# Patient Record
Sex: Male | Born: 1950
Health system: Southern US, Community
[De-identification: ages and names within clinical notes are randomized; demographics above are authoritative.]

## PROBLEM LIST (undated history)

## (undated) DIAGNOSIS — I2510 Atherosclerotic heart disease of native coronary artery without angina pectoris: Secondary | ICD-10-CM

## (undated) DIAGNOSIS — Z9989 Dependence on other enabling machines and devices: Secondary | ICD-10-CM

## (undated) DIAGNOSIS — G4733 Obstructive sleep apnea (adult) (pediatric): Secondary | ICD-10-CM

## (undated) DIAGNOSIS — R51 Headache: Secondary | ICD-10-CM

## (undated) DIAGNOSIS — K219 Gastro-esophageal reflux disease without esophagitis: Secondary | ICD-10-CM

## (undated) HISTORY — DX: Obstructive sleep apnea (adult) (pediatric): G47.33

## (undated) HISTORY — DX: Dependence on other enabling machines and devices: Z99.89

---
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
## 1971-12-14 HISTORY — PX: KNEE ARTHROSCOPY W/ ACL RECONSTRUCTION: SHX1858

## 1998-03-13 ENCOUNTER — Encounter: Admission: RE | Admit: 1998-03-13 | Discharge: 1998-06-11 | Payer: Self-pay | Admitting: Internal Medicine

## 1998-03-17 ENCOUNTER — Ambulatory Visit (HOSPITAL_COMMUNITY): Admission: RE | Admit: 1998-03-17 | Discharge: 1998-03-17 | Payer: Self-pay | Admitting: Internal Medicine

## 1998-12-13 HISTORY — PX: CORONARY ANGIOPLASTY WITH STENT PLACEMENT: SHX49

## 1999-02-20 ENCOUNTER — Ambulatory Visit (HOSPITAL_COMMUNITY): Admission: RE | Admit: 1999-02-20 | Discharge: 1999-02-21 | Payer: Self-pay | Admitting: Cardiovascular Disease

## 2000-09-05 ENCOUNTER — Ambulatory Visit (HOSPITAL_COMMUNITY): Admission: RE | Admit: 2000-09-05 | Discharge: 2000-09-05 | Payer: Self-pay | Admitting: Interventional Cardiology

## 2000-09-12 ENCOUNTER — Ambulatory Visit (HOSPITAL_BASED_OUTPATIENT_CLINIC_OR_DEPARTMENT_OTHER): Admission: RE | Admit: 2000-09-12 | Discharge: 2000-09-12 | Payer: Self-pay | Admitting: Internal Medicine

## 2001-05-16 ENCOUNTER — Emergency Department (HOSPITAL_COMMUNITY): Admission: EM | Admit: 2001-05-16 | Discharge: 2001-05-16 | Payer: Self-pay | Admitting: Emergency Medicine

## 2005-06-10 ENCOUNTER — Encounter: Admission: RE | Admit: 2005-06-10 | Discharge: 2005-06-10 | Payer: Self-pay | Admitting: Internal Medicine

## 2005-11-12 ENCOUNTER — Emergency Department (HOSPITAL_COMMUNITY): Admission: EM | Admit: 2005-11-12 | Discharge: 2005-11-12 | Payer: Self-pay | Admitting: Emergency Medicine

## 2005-12-26 ENCOUNTER — Encounter: Admission: RE | Admit: 2005-12-26 | Discharge: 2005-12-26 | Payer: Self-pay | Admitting: Sports Medicine

## 2006-03-06 ENCOUNTER — Emergency Department (HOSPITAL_COMMUNITY): Admission: EM | Admit: 2006-03-06 | Discharge: 2006-03-06 | Payer: Self-pay | Admitting: Emergency Medicine

## 2006-05-23 ENCOUNTER — Encounter: Admission: RE | Admit: 2006-05-23 | Discharge: 2006-05-23 | Payer: Self-pay | Admitting: Internal Medicine

## 2008-10-23 ENCOUNTER — Ambulatory Visit: Payer: Self-pay | Admitting: Thoracic Surgery

## 2008-11-12 ENCOUNTER — Encounter: Admission: RE | Admit: 2008-11-12 | Discharge: 2008-11-12 | Payer: Self-pay | Admitting: Thoracic Surgery

## 2008-11-13 ENCOUNTER — Ambulatory Visit: Payer: Self-pay | Admitting: Thoracic Surgery

## 2008-12-18 ENCOUNTER — Ambulatory Visit (HOSPITAL_COMMUNITY): Admission: RE | Admit: 2008-12-18 | Discharge: 2008-12-18 | Payer: Self-pay | Admitting: Thoracic Surgery

## 2009-02-07 ENCOUNTER — Inpatient Hospital Stay (HOSPITAL_COMMUNITY): Admission: RE | Admit: 2009-02-07 | Discharge: 2009-02-11 | Payer: Self-pay | Admitting: Thoracic Surgery

## 2009-02-07 ENCOUNTER — Ambulatory Visit: Payer: Self-pay | Admitting: Thoracic Surgery

## 2009-02-19 ENCOUNTER — Encounter: Admission: RE | Admit: 2009-02-19 | Discharge: 2009-02-19 | Payer: Self-pay | Admitting: Thoracic Surgery

## 2009-02-19 ENCOUNTER — Ambulatory Visit: Payer: Self-pay | Admitting: Thoracic Surgery

## 2009-03-12 ENCOUNTER — Ambulatory Visit: Payer: Self-pay | Admitting: Thoracic Surgery

## 2009-03-12 ENCOUNTER — Encounter: Admission: RE | Admit: 2009-03-12 | Discharge: 2009-03-12 | Payer: Self-pay | Admitting: Thoracic Surgery

## 2009-04-23 ENCOUNTER — Ambulatory Visit: Payer: Self-pay | Admitting: Thoracic Surgery

## 2009-04-23 ENCOUNTER — Encounter: Admission: RE | Admit: 2009-04-23 | Discharge: 2009-04-23 | Payer: Self-pay | Admitting: Thoracic Surgery

## 2009-12-13 HISTORY — PX: OTHER SURGICAL HISTORY: SHX169

## 2009-12-19 ENCOUNTER — Encounter: Admission: RE | Admit: 2009-12-19 | Discharge: 2009-12-19 | Payer: Self-pay | Admitting: Internal Medicine

## 2011-02-08 ENCOUNTER — Other Ambulatory Visit: Payer: Self-pay | Admitting: Sports Medicine

## 2011-02-08 DIAGNOSIS — M545 Low back pain: Secondary | ICD-10-CM

## 2011-02-08 DIAGNOSIS — M79606 Pain in leg, unspecified: Secondary | ICD-10-CM

## 2011-02-13 ENCOUNTER — Ambulatory Visit
Admission: RE | Admit: 2011-02-13 | Discharge: 2011-02-13 | Disposition: A | Discharge status: Home / Self Care | Payer: 59 | Source: Ambulatory Visit | Attending: Sports Medicine | Admitting: Sports Medicine

## 2011-02-13 DIAGNOSIS — M545 Low back pain: Secondary | ICD-10-CM

## 2011-02-13 DIAGNOSIS — M79606 Pain in leg, unspecified: Secondary | ICD-10-CM

## 2011-02-16 ENCOUNTER — Emergency Department (HOSPITAL_COMMUNITY): Payer: 59

## 2011-02-16 ENCOUNTER — Observation Stay (HOSPITAL_COMMUNITY)
Admission: EM | Admit: 2011-02-16 | Discharge: 2011-02-17 | Disposition: A | Discharge status: Home / Self Care | Payer: 59 | Attending: Family Medicine | Admitting: Family Medicine

## 2011-02-16 DIAGNOSIS — Z9861 Coronary angioplasty status: Secondary | ICD-10-CM | POA: Insufficient documentation

## 2011-02-16 DIAGNOSIS — R0602 Shortness of breath: Secondary | ICD-10-CM | POA: Insufficient documentation

## 2011-02-16 DIAGNOSIS — I2510 Atherosclerotic heart disease of native coronary artery without angina pectoris: Secondary | ICD-10-CM | POA: Insufficient documentation

## 2011-02-16 DIAGNOSIS — R110 Nausea: Secondary | ICD-10-CM | POA: Insufficient documentation

## 2011-02-16 DIAGNOSIS — Z7982 Long term (current) use of aspirin: Secondary | ICD-10-CM | POA: Insufficient documentation

## 2011-02-16 DIAGNOSIS — R079 Chest pain, unspecified: Principal | ICD-10-CM | POA: Insufficient documentation

## 2011-02-16 LAB — COMPREHENSIVE METABOLIC PANEL WITH GFR
Alkaline Phosphatase: 54 U/L (ref 39–117)
BUN: 14 mg/dL (ref 6–23)
Creatinine, Ser: 1.32 mg/dL (ref 0.4–1.5)
Glucose, Bld: 88 mg/dL (ref 70–99)
Potassium: 3.8 meq/L (ref 3.5–5.1)
Total Protein: 7 g/dL (ref 6.0–8.3)

## 2011-02-16 LAB — CBC
HCT: 38.7 % — ABNORMAL LOW (ref 39.0–52.0)
Hemoglobin: 12.9 g/dL — ABNORMAL LOW (ref 13.0–17.0)
MCH: 30.3 pg (ref 26.0–34.0)
MCHC: 33.3 g/dL (ref 30.0–36.0)
MCV: 90.8 fL (ref 78.0–100.0)
Platelets: 183 10*3/uL (ref 150–400)
RBC: 4.26 MIL/uL (ref 4.22–5.81)
RDW: 13.1 % (ref 11.5–15.5)
WBC: 6 10*3/uL (ref 4.0–10.5)

## 2011-02-16 LAB — COMPREHENSIVE METABOLIC PANEL
ALT: 23 U/L (ref 0–53)
AST: 31 U/L (ref 0–37)
Albumin: 4 g/dL (ref 3.5–5.2)
CO2: 27 mEq/L (ref 19–32)
Calcium: 9.6 mg/dL (ref 8.4–10.5)
Chloride: 106 mEq/L (ref 96–112)
GFR calc Af Amer: >60 mL/min (ref >60)
GFR calc non Af Amer: 55 mL/min — ABNORMAL LOW (ref >60)
Sodium: 138 mEq/L (ref 135–145)
Total Bilirubin: 0.6 mg/dL (ref 0.3–1.2)

## 2011-02-16 LAB — DIFFERENTIAL
Basophils Absolute: 0 10*3/uL (ref 0.0–0.1)
Basophils Relative: 0 % (ref 0–1)
Eosinophils Absolute: 0.1 10*3/uL (ref 0.0–0.7)
Eosinophils Relative: 2 % (ref 0–5)
Lymphocytes Relative: 35 % (ref 12–46)
Lymphs Abs: 2.1 K/uL (ref 0.7–4.0)
Monocytes Absolute: 0.6 K/uL (ref 0.1–1.0)
Monocytes Relative: 11 % (ref 3–12)
Neutro Abs: 3.2 10*3/uL (ref 1.7–7.7)
Neutrophils Relative %: 53 % (ref 43–77)

## 2011-02-16 LAB — POCT CARDIAC MARKERS
CKMB, poc: 1.7 ng/mL (ref 1.0–8.0)
Myoglobin, poc: 111 ng/mL (ref 12–200)
Troponin i, poc: <0.05 ng/mL (ref 0.00–0.09)

## 2011-02-16 MED ORDER — IOHEXOL 300 MG/ML  SOLN
50.0000 mL | Freq: Once | INTRAMUSCULAR | Status: AC | PRN
  Administered 2011-02-16: 50 mL via INTRAVENOUS

## 2011-02-17 LAB — TSH: TSH: 2.286 u[IU]/mL (ref 0.350–4.500)

## 2011-02-17 LAB — LIPID PANEL
Cholesterol: 155 mg/dL (ref 0–200)
HDL: 48 mg/dL (ref >39)
LDL Cholesterol: 93 mg/dL (ref 0–99)
Total CHOL/HDL Ratio: 3.2 ratio
Triglycerides: 70 mg/dL (ref <150)
VLDL: 14 mg/dL (ref 0–40)

## 2011-02-17 LAB — COMPREHENSIVE METABOLIC PANEL
ALT: 21 U/L (ref 0–53)
AST: 26 U/L (ref 0–37)
Alkaline Phosphatase: 50 U/L (ref 39–117)
CO2: 27 mEq/L (ref 19–32)
Calcium: 8.6 mg/dL (ref 8.4–10.5)
Chloride: 100 mEq/L (ref 96–112)
GFR calc Af Amer: >60 mL/min (ref >60)
GFR calc non Af Amer: 59 mL/min — ABNORMAL LOW (ref >60)
Glucose, Bld: 94 mg/dL (ref 70–99)
Sodium: 136 mEq/L (ref 135–145)
Total Bilirubin: 0.7 mg/dL (ref 0.3–1.2)

## 2011-02-17 LAB — TROPONIN I: Troponin I: <0.01 ng/mL (ref 0.00–0.06)

## 2011-02-17 LAB — CK TOTAL AND CKMB (NOT AT ARMC)
CK, MB: 2.6 ng/mL (ref 0.3–4.0)
Relative Index: 0.7 (ref 0.0–2.5)
Total CK: 383 U/L — ABNORMAL HIGH (ref 7–232)
Total CK: 341 U/L — ABNORMAL HIGH (ref 7–232)

## 2011-02-17 LAB — HEPARIN LEVEL (UNFRACTIONATED): Heparin Unfractionated: 0.83 [IU]/mL — ABNORMAL HIGH (ref 0.30–0.70)

## 2011-02-19 NOTE — Consult Note (Signed)
NAMEJIMMY, PLESSINGER NO.:  [id number]  MEDICAL RECORD NO.:  [id number]           PATIENT TYPE:  I  LOCATION:  3711                         FACILITY:  [facility]  PHYSICIAN:  Corky Crafts, MDDATE OF BIRTH:  June 16, 1951  DATE OF CONSULTATION:  02/17/2011 DATE OF DISCHARGE:  02/17/2011                                CONSULTATION   PRIMARY CARE PHYSICIAN:  Eric L. August Saucer, MD  PRIMARY CARDIOLOGIST:  Lyn Records, MD  REASON FOR CONSULTATION:  Chest pain.  HISTORY OF PRESENT ILLNESS:  The patient is a 60 year old male with known coronary artery disease.  He had a bare metal stent to his circumflex over 10 years ago.  He had a cardiac cath in 2001 showing a widely patent stent and no other significant coronary artery disease. He has had intermittent chest pain over the past few months, the first type of pain he had was in the left side of his chest when he would move his left arm and particularly lift his left arm over his head.  This resolved spontaneously.  Over the past few days, he has had a pressure- like feeling in his chest.  It lasted from Sunday through Tuesday continuously.  Once he came to the hospital, he received aspirin, morphine, and nitroglycerin and said he had relief.  He is not sure which medicine helped relieve the pain.  Since that time, he has not had any further chest discomfort and he feels quite well.  He has been up in his room without any chest pain.  He knows that before coming to the hospital, he was walking up stairs in his house without any difficulties and not having any discomfort in his chest.  He reported some shortness of breath also at times systolic.  He could take a deep breath.  He apparently had a CT scan in the emergency room showing no evidence of pulmonary embolism.  His shortness of breath has resolved as well. Prior to his stent, he does not recall having any type of chest pain. He just had a routine stress test that  showed an abnormality and eventually led to a stent.  PAST MEDICAL HISTORY:  Coronary artery disease.  PAST SURGICAL HISTORY:  Diaphragm surgery.  ALLERGIES:  No known drug allergies.  He did say he had problem tolerating PLAVIX.  MEDICATIONS:  Alavert p.r.n., he is not taking aspirin on a regular basis, although he knows he should.  SOCIAL HISTORY:  No tobacco.  Occasional alcohol.  No caffeine.  FAMILY HISTORY:  No early coronary artery disease.  REVIEW OF SYSTEMS:  Significant for constipation, chest discomfort, shortness of breath as noted above.  No significant bleeding issues.  No syncope.  No nausea, vomiting, or focal weakness.  All other systems negative.  PHYSICAL EXAMINATION:  VITAL SIGNS:  Blood pressure 99/61, pulse 60. GENERAL:  He is awake, alert, in no apparent distress. HEAD:  Normocephalic, atraumatic. EYES:  Extraocular movements intact. NECK:  No JVD. CARDIOVASCULAR:  Regular rate and rhythm.  S1 and S2. LUNGS:  Clear to auscultation bilaterally. ABDOMEN:  Mildly obese,  soft, nontender. EXTREMITIES:  No edema.  2+ dorsalis pedis pulses bilaterally.  2+ right radial pulse. NEURO:  No focal motor or sensory deficits. SKIN:  No rash. BACK:  No kyphosis. PSYCH:  Normal mood and affect.  LABORATORY DATA:  Creatinine 1.3, normal hemoglobin, troponin negative x3.  ECG shows normal sinus rhythm, RSR prime pattern, some inferior T- wave flattening.  Chest CT showed no PE and scattered coronary calcifications.  Chest x-ray showed changes consistent with chronic bronchitis and chronic elevation of the left hemidiaphragm.  ASSESSMENT:  A 60 year old with known coronary artery disease and chest discomfort with several atypical features.  PLAN: 1. Cardiac:  He is ruled out for MI despite over 48 hours of     continuous chest pain.  Currently he pain free.  We discussed     workup with either cath or stress test, he would like to avoid a     cath if possible.   He would agree to it only if he had some     abnormality in his enzymes or an abnormality in his stress test.     Since his enzymes are normal, we will plan for an outpatient stress     test. 2. Coronary artery disease.  He needs to be on aspirin 81 mg daily.     He also should be on a statin long-term. 3. We will discontinued heparin, ambulate in the hallways, if he     remains pain free then we plan allow him to go home today.     Outpatient stress test has been set up at [facility] for this     Friday at 10 a.m.     Corky Crafts, MD     JSV/MEDQ  D:  02/17/2011  T:  02/17/2011  Job:  [id number]  Electronically Signed by Lance Muss MD on 02/18/2011 10:10:09 AM

## 2011-02-19 NOTE — Discharge Summary (Signed)
NAMEVAUGHAN, Kurt Williams NO.:  [id number]  MEDICAL RECORD NO.:  [id number]           PATIENT TYPE:  I  LOCATION:  3711                         FACILITY:  [facility]  PHYSICIAN:  Brendia Sacks, MD    DATE OF BIRTH:  11-11-1951  DATE OF ADMISSION:  02/16/2011 DATE OF DISCHARGE:  02/17/2011                              DISCHARGE SUMMARY   CONDITION ON DISCHARGE:  Improved.  PRIMARY CARE PHYSICIAN:  Eric L. August Saucer, MD  PRIMARY CARDIOLOGIST:  Lyn Records, MD  DISCHARGE DIAGNOSES: 1. Chest pain, ruled out for acute myocardial infarction. 2. History of coronary artery disease with stent placement in the     past.  HISTORY OF PRESENT ILLNESS:  This is a 60 year old male who presented to the emergency room chest pain.  HOSPITAL COURSE: 1. Chest pain:  The patient was admitted to the medical floor.  He was     rule out with serial cardiac enzymes.  He has had no chest pain     since his admission to the ER.  His workup has been otherwise     negative.  He has been seen in consultation with Cardiology.     Cardiac catheterization versus stress test was discussed and the     patient wished to proceed with stress testing.  So it was felt from     Dr. Hoyle Barr standpoint that the patient can be discharged home     today and he could have a stress test as an outpatient on February 19, 2011. 2. History of coronary artery disease:  The patient has not been on     aspirin at home, but I have asked him to resume this medication.  I     have also given him a new prescription for nitroglycerin.  Further     cardiac medications as directed by his primary cardiologist.  CONSULTATIONS:  Cardiology, Dr. Eldridge Dace.  His recommendations as above.  PROCEDURES:  None.  IMAGING: 1. Chest x-ray on February 16, 2011:  Chronic bronchitic changes and     chronic elevation of the left hemidiaphragm.  No acute process. 2. CT angiogram of the chest on February 16, 2011:  No evidence of  pulmonary embolus.  Areas of scarring in the apices and left base.     No acute findings.  PERTINENT LABORATORY STUDIES: 1. CBC essentially unremarkable. 2. D-dimer mildly elevated at 0.66. 3. Basic metabolic panel unremarkable. 4. Hepatic function panel unremarkable. 5. Cardiac enzymes negative.  Mild elevation of total CK.  Lipid panel     unremarkable with an LDL of 93 and triglycerides of 70.  EXAM ON DISCHARGE:  The patient feels well.  No problems.  Otherwise as documented in progress notes.  DISCHARGE INSTRUCTIONS:  The patient will be discharged home today.  DIET:  Heart-healthy diet.  ACTIVITY:  Increase slowly.  No strenuous activity for now until cleared by Cardiology.  FOLLOWUP:  With [facility] on February 19, 2011, at 10 a.m. as per Dr. Hoyle Barr and with Dr. Willey Blade in 2 weeks.  DISCHARGE MEDICATIONS: 1.  Aspirin 81 mg p.o. daily. 2. Nitroglycerin sublingual 0.4 mg p.r.n. chest pain, may repeat q.5     minutes x2, if no relief call 911.  TIME COORDINATING DISCHARGE:  25 minutes.     Brendia Sacks, MD     DG/MEDQ  D:  02/17/2011  T:  02/17/2011  Job:  [id number]  cc:   Minerva Areola L. August Saucer, M.D. Lyn Records, M.D.  Electronically Signed by Brendia Sacks  on 02/18/2011 08:19:02 AM
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
## 2011-03-25 LAB — BASIC METABOLIC PANEL
CO2: 26 mEq/L (ref 19–32)
CO2: 28 mEq/L (ref 19–32)
Chloride: 100 mEq/L (ref 96–112)
Chloride: 99 mEq/L (ref 96–112)
Creatinine, Ser: 1.3 mg/dL (ref 0.4–1.5)
Creatinine, Ser: 1.38 mg/dL (ref 0.4–1.5)
GFR calc Af Amer: >60 mL/min (ref >60)
GFR calc Af Amer: >60 mL/min (ref >60)
Glucose, Bld: 107 mg/dL — ABNORMAL HIGH (ref 70–99)
Potassium: 4.5 mEq/L (ref 3.5–5.1)
Sodium: 132 mEq/L — ABNORMAL LOW (ref 135–145)

## 2011-03-25 LAB — CBC
HCT: 36.8 % — ABNORMAL LOW (ref 39.0–52.0)
Hemoglobin: 12.5 g/dL — ABNORMAL LOW (ref 13.0–17.0)
MCHC: 34 g/dL (ref 30.0–36.0)
MCV: 94.2 fL (ref 78.0–100.0)
RBC: 3.91 MIL/uL — ABNORMAL LOW (ref 4.22–5.81)
WBC: 9.7 10*3/uL (ref 4.0–10.5)

## 2011-03-30 LAB — CBC
HCT: 36.1 % — ABNORMAL LOW (ref 39.0–52.0)
Hemoglobin: 12.6 g/dL — ABNORMAL LOW (ref 13.0–17.0)
MCHC: 34.3 g/dL (ref 30.0–36.0)
MCHC: 34.6 g/dL (ref 30.0–36.0)
Platelets: 154 10*3/uL (ref 150–400)
Platelets: 168 10*3/uL (ref 150–400)
RBC: 3.92 MIL/uL — ABNORMAL LOW (ref 4.22–5.81)
RBC: 4.21 MIL/uL — ABNORMAL LOW (ref 4.22–5.81)
RDW: 13 % (ref 11.5–15.5)
RDW: 13.4 % (ref 11.5–15.5)
WBC: 11.1 10*3/uL — ABNORMAL HIGH (ref 4.0–10.5)
WBC: 5.1 10*3/uL (ref 4.0–10.5)

## 2011-03-30 LAB — TYPE AND SCREEN
ABO/RH(D): B POS
Antibody Screen: NEGATIVE

## 2011-03-30 LAB — COMPREHENSIVE METABOLIC PANEL
ALT: 27 U/L (ref 0–53)
ALT: 28 U/L (ref 0–53)
AST: 32 U/L (ref 0–37)
AST: 63 U/L — ABNORMAL HIGH (ref 0–37)
Albumin: 3.6 g/dL (ref 3.5–5.2)
Alkaline Phosphatase: 53 U/L (ref 39–117)
CO2: 21 mEq/L (ref 19–32)
CO2: 25 mEq/L (ref 19–32)
Calcium: 8.8 mg/dL (ref 8.4–10.5)
Calcium: 9.3 mg/dL (ref 8.4–10.5)
Chloride: 108 mEq/L (ref 96–112)
GFR calc Af Amer: >60 mL/min (ref >60)
GFR calc Af Amer: >60 mL/min (ref >60)
GFR calc non Af Amer: >60 mL/min (ref >60)
Glucose, Bld: 122 mg/dL — ABNORMAL HIGH (ref 70–99)
Potassium: 4.3 mEq/L (ref 3.5–5.1)
Sodium: 130 mEq/L — ABNORMAL LOW (ref 135–145)
Sodium: 137 mEq/L (ref 135–145)
Total Protein: 6.5 g/dL (ref 6.0–8.3)

## 2011-03-30 LAB — URINALYSIS, ROUTINE W REFLEX MICROSCOPIC
Glucose, UA: NEGATIVE mg/dL
Ketones, ur: NEGATIVE mg/dL
Nitrite: NEGATIVE
Specific Gravity, Urine: 1.018 (ref 1.005–1.030)
pH: 8 (ref 5.0–8.0)

## 2011-03-30 LAB — BLOOD GAS, ARTERIAL
Drawn by: 206361
FIO2: 0.21 %
pCO2 arterial: 37.3 mmHg (ref 35.0–45.0)
pO2, Arterial: 94.5 mmHg (ref 80.0–100.0)

## 2011-03-30 LAB — POCT I-STAT 3, ART BLOOD GAS (G3+)
Acid-base deficit: 2 mmol/L (ref 0.0–2.0)
O2 Saturation: 97 %
Patient temperature: 99
pO2, Arterial: 95 mmHg (ref 80.0–100.0)

## 2011-03-30 LAB — BASIC METABOLIC PANEL
BUN: 13 mg/dL (ref 6–23)
Creatinine, Ser: 1.12 mg/dL (ref 0.4–1.5)
GFR calc non Af Amer: >60 mL/min (ref >60)
Glucose, Bld: 116 mg/dL — ABNORMAL HIGH (ref 70–99)

## 2011-03-30 LAB — GLUCOSE, CAPILLARY: Glucose-Capillary: 99 mg/dL (ref 70–99)

## 2011-03-30 LAB — ABO/RH: ABO/RH(D): B POS

## 2011-04-27 NOTE — Letter (Signed)
November 13, 2008   Ardeth Sportsman, MD  [address] [place]  [address]   Re:  KORON, GODEAUX                  DOB:  03-Feb-1951   Dear Brett Canales:   I saw the patient back today and repeated CT scan does show an  eventration of his left diaphragm and there is also a small eventration  of the right diaphragm.  I think he will gradually get worse and worse,  and I think the thing to do is to do a plication of his left diaphragm.  This is strictly an elective procedure, so we will do this sometime in  January after the New Year.  I explained the risk of the procedure to  him and however it was done and he agrees to the surgery.  I appreciate  the opportunity of seeing the patient.  We will do it with a VATS  approach.   Ines Bloomer, M.D.  Electronically Signed   DPB/MEDQ  D:  11/13/2008  T:  11/13/2008  Job:  [id number]

## 2011-04-27 NOTE — Op Note (Signed)
NAMECOSME, JACOB NO.:  [id number]   MEDICAL RECORD NO.:  [id number]          PATIENT TYPE:  INP   LOCATION:  2315                         FACILITY:  [facility]   PHYSICIAN:  Ines Bloomer, M.D. 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   OPERATION PERFORMED:  Left video-assisted thoracoscopic surgery repair  of eventration.   SURGEON:  Ines Bloomer, MD   ANESTHESIA:  General anesthesia.   OPERATION DETAILS:  After percutaneous insertion of all monitoring  lines, the patient underwent general anesthesia and was turned to the  left lateral thoracotomy position.  Two trocar sites were made in the  anterior and posterior axillary line at the fifth intercostal space and  two trocars were inserted and 0-degree scope was inserted.  Pictures  were taken showing an eventration of the diaphragm.  We then made a 5-cm  incision over the ninth intercostal space and divided the latissimus and  into the ninth intercostal space, reflecting the intercostal muscle  superiorly to protect the nerve.  A small Churchill retractor was  inserted with minimal rib spreading.  We started with a 0-stitch at the  anterolateral portion of the diaphragm in an interrupted fashion.  We  then brought in a dissector to depress the diaphragm from the medial  trocar site.  It was dissected and depressed down.  We were able to  start imbricating the stitches taking small bites just through the  diaphragm.  We ran an over-and-over stitch imbricating the diaphragm up  to the posteromedial area where we could see the phrenic nerve.  We then  locked the stitch and then came back again  with a running stitch in the  same axis back to the anterolateral portion of the diaphragm and tied  that down.  In like manner, we started another stitch and ran it from  lateral to medially and then back again taking bites in the diaphragm to  imbricate the diaphragm.  We finished with the diaphragm being at level  of the xiphoid.  A #28 chest tube was placed to the anterior trocar site  and Marcaine block was done in the usual fashion.  A single On-Q was  inserted in the usual fashion.  The patient was returned to the recovery  room in stable condition.      Ines Bloomer, M.D.  Electronically Signed     DPB/MEDQ  D:  02/07/2009  T:  02/07/2009  Job:  [id number]
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
## 2011-04-27 NOTE — Letter (Signed)
March 12, 2009   Ardeth Sportsman, MD  [address] [place] [address]   Re:  MAJD, TISSUE                  DOB:  10-20-1951   Dear Dr. Michaell Cowing:   I saw the patient in the office today.  He has got an excellent result  from his eventration repair.  He is back to full activity.  His  incisions are well healed.  His blood pressure was 114/77, pulse 61,  respirations 18, sats were 98%.  We will see him back again in 6 weeks  with another chest x-ray.   Ines Bloomer, M.D.  Electronically Signed   DPB/MEDQ  D:  03/12/2009  T:  03/13/2009  Job:  [id number]   cc:   Minerva Areola L. August Saucer, M.D.

## 2011-04-27 NOTE — Letter (Signed)
February 19, 2009   Ardeth Sportsman, MD  [address] [place], [place] [address]   Re:  Kurt Williams, JUBA                  DOB:  [birth year]   Dear Dr. Viviann Spare:   I saw the patient back after repair of his diaphragmatic eventration.  He is doing well.  His incisions are well healed.  He has minimal pain.  I removed his chest tube sutures.  He is eating small amounts, but his  appetite is returning.  His chest x-ray showed a good plication of his  diaphragm with the left diaphragm being equal now to his right  diaphragm.  We will see him back again in 3 weeks with a chest x-ray and  I told him he could return to driving and gradually increase his  activity.   Sincerely,   Kurt Williams, M.D.  Electronically Signed   DPB/MEDQ  D:  02/19/2009  T:  02/20/2009  Job:  [id number]

## 2011-04-27 NOTE — Letter (Signed)
Apr 23, 2009   Ardeth Sportsman, MD  [address] [place], [place] [address]   Re:  WELDON, NOURI                  DOB:  Apr 22, 1951   Dear Brett Canales,   I saw the patient back today for his final followup after repair of his  left diaphragm eventration.  He says he is breathing better.  He is  doing better.  His incisions look good.  He is doing well overall.  Chest x-ray shows that the left diaphragm is in good position.  I will  see him again if he has any future problems.   Ines Bloomer, M.D.  Electronically Signed   DPB/MEDQ  D:  04/23/2009  T:  04/24/2009  Job:  [id number]   cc:   Minerva Areola L. August Saucer, M.D.
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
## 2011-04-30 NOTE — Cardiovascular Report (Signed)
[place]  Patient:    Kurt Williams, Kurt Williams                         MRN: [id number] Proc. Date: 09/05/00 Adm. Date:  [id number] Attending:  Lyn Records. Iii CC:         Eric L. August Saucer, M.D.   Cardiac Catheterization  DATE OF BIRTH:  02-May-1951  INDICATIONS:  Recurrent chest pain, recently mildly abnormal stress Cardiolite suggesting lateral Roach ischemia.  This study is being done to rule out evidence of re-stenosis in the circumflex stent, or progression of disease and other native coronaries.  PROCEDURES PERFORMED: 1. Left heart catheterization. 2. Selective coronary angiography. 3. Left ventriculography by hand injection. 4. Perclose arteriotomy closure.  DESCRIPTION OF PROCEDURE:  After informed consent, a 6 French sheath was inserted into the right femoral artery using modified Seldinger technique.  A 6 French A2 multipurpose catheter was used for hemodynamic recordings, left ventriculography and selective right and left coronary angiography.  Perclose arteriotomy technique was used for arteriotomy closure.  No complications occurred.  The patient tolerated the procedure without problems.  RESULTS:  I:  HEMODYNAMIC DATA:     a. The aortic pressure 111/67 mmHg.     b. Left ventricular pressure 112/20 mmHg.  II:  LEFT VENTRICULOGRAPHY:  The left ventricle is faintly opacified by hand injection.  Overall contractility is normal.  EF is at least 50%.  III:  SELECTIVE CORONARY ANGIOGRAPHY:     a. Left main coronary:  Normal.     b. Left anterior descending coronary artery:  A large vessel that        wraps around the left ventricular apex given one large diagonal branch        that arises in the mid vessel.  The LAD territory is entirely normal.     c. Circumflex artery:  This is a large dominant vessel that gives origin        to six obtuse marginal branches.  There is a proximal significant        region of tortuosity in the  circumflex.  This is a region of the        previously placed stent.  Only minimal luminal irregularity is noted        proximal to the stent in the proximal circumflex.  The circumflex is        essentially normal.     d. Right coronary artery:  Nondominant vessel with no significant        obstructive lesions noted.  CONCLUSIONS: 1. Essentially normal coronaries.  Widely patent circumflex stent.  Minimal    luminal irregularity is noted proximal to the stent.  Left coronary and    right coronary are otherwise entirely normal. 2. Left ventricular function.  RECOMMENDATIONS:  No further cardiac evaluation.  Recent chest pain was noncardiac.   Cardiolite abnormalities were felt to be false-positive. DD:  09/05/00 TD:  09/05/00 Job: [id number]

## 2012-05-21 ENCOUNTER — Inpatient Hospital Stay (HOSPITAL_COMMUNITY): Payer: 59

## 2012-05-21 ENCOUNTER — Encounter (HOSPITAL_COMMUNITY): Payer: Self-pay | Admitting: Emergency Medicine

## 2012-05-21 ENCOUNTER — Inpatient Hospital Stay (HOSPITAL_COMMUNITY)
Admission: EM | Admit: 2012-05-21 | Discharge: 2012-05-22 | DRG: 312 | Disposition: A | Discharge status: Home / Self Care | Payer: 59 | Source: Ambulatory Visit | Attending: Internal Medicine | Admitting: Internal Medicine

## 2012-05-21 DIAGNOSIS — F17200 Nicotine dependence, unspecified, uncomplicated: Secondary | ICD-10-CM | POA: Diagnosis present

## 2012-05-21 DIAGNOSIS — I2510 Atherosclerotic heart disease of native coronary artery without angina pectoris: Secondary | ICD-10-CM

## 2012-05-21 DIAGNOSIS — I498 Other specified cardiac arrhythmias: Secondary | ICD-10-CM | POA: Diagnosis present

## 2012-05-21 DIAGNOSIS — Z9861 Coronary angioplasty status: Secondary | ICD-10-CM

## 2012-05-21 DIAGNOSIS — R748 Abnormal levels of other serum enzymes: Secondary | ICD-10-CM

## 2012-05-21 DIAGNOSIS — Z7982 Long term (current) use of aspirin: Secondary | ICD-10-CM

## 2012-05-21 DIAGNOSIS — M6282 Rhabdomyolysis: Secondary | ICD-10-CM

## 2012-05-21 DIAGNOSIS — R55 Syncope and collapse: Principal | ICD-10-CM | POA: Diagnosis present

## 2012-05-21 HISTORY — DX: Headache: R51

## 2012-05-21 HISTORY — DX: Atherosclerotic heart disease of native coronary artery without angina pectoris: I25.10

## 2012-05-21 HISTORY — DX: Gastro-esophageal reflux disease without esophagitis: K21.9

## 2012-05-21 LAB — URINALYSIS, ROUTINE W REFLEX MICROSCOPIC
Bilirubin Urine: NEGATIVE
Ketones, ur: NEGATIVE mg/dL
Nitrite: NEGATIVE
Protein, ur: NEGATIVE mg/dL
Urobilinogen, UA: 0.2 mg/dL (ref 0.0–1.0)

## 2012-05-21 LAB — CBC
HCT: 39 % (ref 39.0–52.0)
MCHC: 32.8 g/dL (ref 30.0–36.0)
MCV: 90.5 fL (ref 78.0–100.0)
Platelets: 176 10*3/uL (ref 150–400)
RDW: 13.5 % (ref 11.5–15.5)

## 2012-05-21 LAB — CARDIAC PANEL(CRET KIN+CKTOT+MB+TROPI)
CK, MB: 4 ng/mL (ref 0.3–4.0)
CK, MB: 4.4 ng/mL — ABNORMAL HIGH (ref 0.3–4.0)
Relative Index: 0.6 (ref 0.0–2.5)
Total CK: 649 U/L — ABNORMAL HIGH (ref 7–232)
Troponin I: <0.3 ng/mL (ref <0.30)
Troponin I: <0.3 ng/mL (ref <0.30)
Troponin I: <0.3 ng/mL (ref <0.30)

## 2012-05-21 LAB — BASIC METABOLIC PANEL
BUN: 17 mg/dL (ref 6–23)
Calcium: 9.6 mg/dL (ref 8.4–10.5)
Creatinine, Ser: 1.24 mg/dL (ref 0.50–1.35)
GFR calc Af Amer: 70 mL/min — ABNORMAL LOW (ref >90)
GFR calc non Af Amer: 60 mL/min — ABNORMAL LOW (ref >90)

## 2012-05-21 MED ORDER — ACETAMINOPHEN 325 MG PO TABS: 650.0000 mg | ORAL_TABLET | Freq: Four times a day (QID) | ORAL | Status: DC | PRN

## 2012-05-21 MED ORDER — ACETAMINOPHEN 650 MG RE SUPP: 650.0000 mg | Freq: Four times a day (QID) | RECTAL | Status: DC | PRN

## 2012-05-21 MED ORDER — SODIUM CHLORIDE 0.9 % IJ SOLN: 3.0000 mL | Freq: Two times a day (BID) | INTRAMUSCULAR | Status: DC

## 2012-05-21 MED ORDER — DOCUSATE SODIUM 100 MG PO CAPS
100.0000 mg | ORAL_CAPSULE | Freq: Two times a day (BID) | ORAL | Status: DC
  Administered 2012-05-21: 100 mg via ORAL
  Filled 2012-05-21 (×4): qty 1

## 2012-05-21 MED ORDER — HYDROCODONE-ACETAMINOPHEN 5-325 MG PO TABS: 1.0000 | ORAL_TABLET | ORAL | Status: DC | PRN

## 2012-05-21 MED ORDER — ALUM & MAG HYDROXIDE-SIMETH 200-200-20 MG/5ML PO SUSP: 30.0000 mL | Freq: Four times a day (QID) | ORAL | Status: DC | PRN

## 2012-05-21 MED ORDER — SODIUM CHLORIDE 0.9 % IV SOLN
INTRAVENOUS | Status: AC
  Administered 2012-05-21 (×2): via INTRAVENOUS

## 2012-05-21 MED ORDER — GUAIFENESIN-DM 100-10 MG/5ML PO SYRP: 5.0000 mL | ORAL_SOLUTION | ORAL | Status: DC | PRN

## 2012-05-21 MED ORDER — ENOXAPARIN SODIUM 40 MG/0.4ML ~~LOC~~ SOLN
40.0000 mg | SUBCUTANEOUS | Status: DC
  Filled 2012-05-21 (×2): qty 0.4

## 2012-05-21 MED ORDER — ALBUTEROL SULFATE (5 MG/ML) 0.5% IN NEBU: 2.5000 mg | INHALATION_SOLUTION | RESPIRATORY_TRACT | Status: DC | PRN

## 2012-05-21 MED ORDER — ASPIRIN 81 MG PO CHEW
81.0000 mg | CHEWABLE_TABLET | Freq: Every day | ORAL | Status: DC
  Administered 2012-05-21: 81 mg via ORAL
  Filled 2012-05-21: qty 1

## 2012-05-21 MED ORDER — ONDANSETRON HCL 4 MG PO TABS: 4.0000 mg | ORAL_TABLET | Freq: Four times a day (QID) | ORAL | Status: DC | PRN

## 2012-05-21 MED ORDER — ONDANSETRON HCL 4 MG/2ML IJ SOLN: 4.0000 mg | Freq: Four times a day (QID) | INTRAMUSCULAR | Status: DC | PRN

## 2012-05-21 NOTE — ED Provider Notes (Signed)
History     CSN: [id number]  Arrival date & time 05/21/12  0208   First MD Initiated Contact with Patient 05/21/12 0301      Chief Complaint  Patient presents with  . Loss of Consciousness    (Consider location/radiation/quality/duration/timing/severity/associated sxs/prior treatment) HPI 61 year old male presents to emergency room via EMS after episode of syncope. Wife reports they were talking when all of a sudden he "went out". Patient reports previous history of syncope around Christmas time in 2011. He did not seek care at that time. Patient has history of coronary disease with stent in place. His last coronary cath was negative. Patient denies any chest pain. Wife and EMS report patient was significantly diaphoretic having soaked through his T-shirt with sweat. Patient denies any shortness of breath. He had no prodromal symptoms. He denies any palpitations fever cough leg swelling or other significant symptoms.  Past Medical History  Diagnosis Date  . Coronary artery disease     Past Surgical History  Procedure Date  . Coronary angioplasty with stent placement 2000    Family History  Problem Relation Age of Onset  . Hypertension Mother   . Cancer Mother     multiple meyloma  . Cancer Father     stomach cancer  . Diabetes Father     History  Substance Use Topics  . Smoking status: Current Some Day Smoker    Types: Cigarettes  . Smokeless tobacco: Not on file  . Alcohol Use: Yes     occasionaly      Review of Systems  All other systems reviewed and are negative.    Allergies  Review of patient's allergies indicates no known allergies.  Home Medications   Current Outpatient Rx  Name Route Sig Dispense Refill  . ASPIRIN 81 MG PO CHEW Oral Chew 81 mg by mouth daily.    . COD LIVER OIL PO CAPS Oral Take 1 capsule by mouth daily.    . OMEGA-3-ACID ETHYL ESTERS 1 G PO CAPS Oral Take 1 g by mouth daily.    Marland Kitchen OVER THE COUNTER MEDICATION Oral Take 1 tablet by  mouth daily as needed. Allergy medication OTC      BP 109/66  Pulse 60  Temp 98 F (36.7 C)  Resp 16  SpO2 100%  Physical Exam  Nursing note and vitals reviewed. Constitutional: He is oriented to person, place, and time. He appears well-developed and well-nourished.  HENT:  Head: Normocephalic and atraumatic.  Right Ear: External ear normal.  Left Ear: External ear normal.  Nose: Nose normal.  Mouth/Throat: Oropharynx is clear and moist.  Eyes: Conjunctivae and EOM are normal. Pupils are equal, round, and reactive to light.  Neck: Normal range of motion. Neck supple. No JVD present. No tracheal deviation present. No thyromegaly present.  Cardiovascular: Normal rate, regular rhythm, normal heart sounds and intact distal pulses.  Exam reveals no gallop and no friction rub.   No murmur heard. Pulmonary/Chest: Effort normal and breath sounds normal. No stridor. No respiratory distress. He has no wheezes. He has no rales. He exhibits no tenderness.  Abdominal: Soft. Bowel sounds are normal. He exhibits no distension and no mass. There is no tenderness. There is no rebound and no guarding.  Musculoskeletal: Normal range of motion. He exhibits no edema and no tenderness.  Lymphadenopathy:    He has no cervical adenopathy.  Neurological: He is oriented to person, place, and time. He has normal reflexes. He exhibits normal muscle tone. Coordination  normal.  Skin: Skin is dry. No rash noted. No erythema. No pallor.  Psychiatric: He has a normal mood and affect. His behavior is normal. Judgment and thought content normal.    ED Course  Procedures (including critical care time)  Date: 05/21/2012  Rate: 56  Rhythm: sinus bradycardia  QRS Axis: normal  Intervals: normal  ST/T Wave abnormalities: normal  Conduction Disutrbances:none  Narrative Interpretation:   Old EKG Reviewed: unchanged   Labs Reviewed  CBC - Abnormal; Notable for the following:    WBC 11.0 (*)    Hemoglobin 12.8  (*)    All other components within normal limits  BASIC METABOLIC PANEL - Abnormal; Notable for the following:    Glucose, Bld 129 (*)    GFR calc non Af Amer 60 (*)    GFR calc Af Amer 70 (*)    All other components within normal limits  URINALYSIS, ROUTINE W REFLEX MICROSCOPIC - Abnormal; Notable for the following:    APPearance HAZY (*)    All other components within normal limits  CARDIAC PANEL(CRET KIN+CKTOT+MB+TROPI) - Abnormal; Notable for the following:    Total CK 726 (*)    CK, MB 4.3 (*)    All other components within normal limits   No results found.   1. Syncope   2. Cardiac enzymes elevated   3. CAD (coronary artery disease)       MDM  61 year old male with episode of syncope without prodrome, coronary disease. Presentation concerning for malignant arrhythmia. Will admit to medicine service for continued risk stratification and monitoring        Olivia Mackie, MD 05/21/12 [phone]

## 2012-05-21 NOTE — ED Notes (Signed)
Report given to Barbara RN

## 2012-05-21 NOTE — Progress Notes (Signed)
Subjective: Feeling ok; no CP, no SOB.  Objective: Vital signs in last 24 hours: Temp:  [97.7 F (36.5 C)-98 F (36.7 C)] 97.7 F (36.5 C) (06/09 0833) Pulse Rate:  [55-63] 55  (06/09 0833) Resp:  [12-18] 16  (06/09 0833) BP: (95-115)/(58-75) 111/70 mmHg (06/09 0833) SpO2:  [96 %-100 %] 98 % (06/09 0833) Weight change:  Last BM Date: 05/20/12  Intake/Output from previous day:       Physical Exam: General: Alert, awake, oriented x3, in no acute distress. HEENT: No bruits, no goiter. Heart: Regular rate and rhythm, without murmurs, rubs, gallops. Lungs: Clear to auscultation bilaterally. Abdomen: Soft, nontender, nondistended, positive bowel sounds. Extremities: No clubbing, cyanosis or edema with positive pedal pulses. Neuro: Grossly intact, nonfocal.   Lab Results: Basic Metabolic Panel:  Basename 05/21/12 0323  NA 139  K 4.3  CL 105  CO2 23  GLUCOSE 129*  BUN 17  CREATININE 1.24  CALCIUM 9.6  MG --  PHOS --   CBC:  Basename 05/21/12 0323  WBC 11.0*  NEUTROABS --  HGB 12.8*  HCT 39.0  MCV 90.5  PLT 176   Cardiac Enzymes:  Basename 05/21/12 0322  CKTOTAL 726*  CKMB 4.3*  CKMBINDEX --  TROPONINI <0.30   Urinalysis:  Basename 05/21/12 0625  COLORURINE YELLOW  LABSPEC 1.030  PHURINE 5.5  GLUCOSEU NEGATIVE  HGBUR NEGATIVE  BILIRUBINUR NEGATIVE  KETONESUR NEGATIVE  PROTEINUR NEGATIVE  UROBILINOGEN 0.2  NITRITE NEGATIVE  LEUKOCYTESUR NEGATIVE    Studies/Results: Dg Chest 2 View  05/21/2012  *RADIOLOGY REPORT*  Clinical Data: Syncope.  History of coronary artery disease.  CHEST - 2 VIEW  Comparison: 02/16/2011 and 12/19/2009  Findings: Two views of the chest were obtained.  There is stable elevation of the left hemidiaphragm.  Prominent interstitial lung markings are similar to prior exams.  Heart size is within normal limits for size.  Trachea is midline.  No evidence for pleural effusions. A few densities at the left lung base are suggestive  for vascular markings and possibly atelectasis.  IMPRESSION: Prominent interstitial lung markings.  Suspect that this represents chronic changes rather than edema.  Probable left basilar atelectasis.  Original Report Authenticated By: Richarda Overlie, M.D.    Medications: Scheduled Meds:   . aspirin  81 mg Oral Daily  . docusate sodium  100 mg Oral BID  . enoxaparin  40 mg Subcutaneous Q24H  . sodium chloride  3 mL Intravenous Q12H   Continuous Infusions:   . sodium chloride     PRN Meds:.acetaminophen, acetaminophen, albuterol, alum & mag hydroxide-simeth, guaiFENesin-dextromethorphan, HYDROcodone-acetaminophen, ondansetron (ZOFRAN) IV, ondansetron  Assessment/Plan: 1-Syncope: will follow syncope workup; but per patient hx most likely related to orthostatic/vasovagal syncope due to dehydration. His urine specific gravity was borderline elevated and on physical exam has dry mucose membranes. Will continue IVF's.   2-CAD (coronary artery disease): will follow 2-D echo, cycle cardiac enzymes and continue ASA.  3-DVT:lovenox.    LOS: 0 days   Margean Korell [facility] [phone]  05/21/2012, 10:27 AM

## 2012-05-21 NOTE — ED Notes (Signed)
CBG per EMS 137

## 2012-05-21 NOTE — ED Notes (Signed)
Admitting Dr at bedside

## 2012-05-21 NOTE — H&P (Signed)
PCP:   Willey Blade, MD, MD  Cardiologist Verdis Prime  Chief Complaint:   syncope  HPI: Kurt Williams is a 61 y.o. male   has a past medical history of Coronary artery disease.   Presented with  Sudden onset of syncope no prodromal state, woke up after 1 min and was clammy and sweating no SOB. EMT checked SBP and told him it was ok, BG also checked and was normal. He had a very busy few days and have been excessively active lately. He mowed the lawn and went to church prior to having this event. No seizure activity no incontinence. He have been athletic all his life and have and slow heart rate and low normal blood pressure. Denies any chest pain.  Review of Systems:    Pertinent positives include: syncope  Constitutional:  No weight loss, night sweats, Fevers, chills, fatigue, weight loss  HEENT:  No headaches, Difficulty swallowing,Tooth/dental problems,Sore throat,  No sneezing, itching, ear ache, nasal congestion, post nasal drip,  Cardio-vascular:  No chest pain, Orthopnea, PND, anasarca, dizziness, palpitations.no Bilateral lower extremity swelling  GI:  No heartburn, indigestion, abdominal pain, nausea, vomiting, diarrhea, change in bowel habits, loss of appetite, melena, blood in stool, hematemesis Resp:  no shortness of breath at rest. No dyspnea on exertion, No excess mucus, no productive cough, No non-productive cough, No coughing up of blood.No change in color of mucus.No wheezing. Skin:  no rash or lesions. No jaundice GU:  no dysuria, change in color of urine, no urgency or frequency. No straining to urinate.  No flank pain.  Musculoskeletal:  No joint pain or no joint swelling. No decreased range of motion. No back pain.  Psych:  No change in mood or affect. No depression or anxiety. No memory loss.  Neuro: no localizing neurological complaints, no tingling, no weakness, no double vision, no gait abnormality, no slurred speech, no confusion  Otherwise ROS are  negative except for above, 10 systems were reviewed  Past Medical History: Past Medical History  Diagnosis Date  . Coronary artery disease    Past Surgical History  Procedure Date  . Coronary angioplasty with stent placement 2000     Medications: Prior to Admission medications   Medication Sig Start Date End Date Taking? Authorizing Provider  aspirin 81 MG chewable tablet Chew 81 mg by mouth daily.   Yes Historical Provider, MD  [facility] Liver Oil CAPS Take 1 capsule by mouth daily.   Yes Historical Provider, MD  omega-3 acid ethyl esters (LOVAZA) 1 G capsule Take 1 g by mouth daily.   Yes Historical Provider, MD  OVER THE COUNTER MEDICATION Take 1 tablet by mouth daily as needed. Allergy medication OTC   Yes Historical Provider, MD    Allergies:  No Known Allergies  Social History:  Ambulatory independently Lives at  Home with wife   reports that he has been smoking Cigarettes.  He does not have any smokeless tobacco history on file. He reports that he drinks alcohol. He reports that he does not use illicit drugs.   Family History: family history includes Cancer in his father and mother; Diabetes in his father; and Hypertension in his mother. no hx of sudden cardiac death   Physical Exam: Patient Vitals for the past 24 hrs:  BP Temp Pulse Resp SpO2  05/21/12 0507 109/66 mmHg - 60  16  100 %  05/21/12 0330 111/65 mmHg - 58  18  100 %  05/21/12 0315 109/69 mmHg -  61  16  100 %  05/21/12 0300 96/68 mmHg - 60  12  100 %  05/21/12 0245 99/58 mmHg - 62  14  100 %  05/21/12 0230 95/64 mmHg 98 F (36.7 C) 63  18  97 %    1. General:  in No Acute distress 2. Psychological: Alert and  Oriented 3. Head/ENT:    Dry Mucous Membranes                          Head Non traumatic, neck supple                          Normal Dentition 4. SKIN: normal  Skin turgor,  Skin clean Dry and intact no rash 5. Heart: Regular rate and rhythm no Murmur, Rub or gallop 6. Lungs: Clear to  auscultation bilaterally, no wheezes or crackles   7. Abdomen: Soft, non-tender, Non distended 8. Lower extremities: no clubbing, cyanosis, or edema 9. Neurologically Grossly intact, moving all 4 extremities equally 10. MSK: Normal range of motion  body mass index is unknown because there is no height or weight on file.   Labs on Admission:   [facility] 05/21/12 0323  NA 139  K 4.3  CL 105  CO2 23  GLUCOSE 129*  BUN 17  CREATININE 1.24  CALCIUM 9.6  MG --  PHOS --   No results found for this basename: AST:2,ALT:2,ALKPHOS:2,BILITOT:2,PROT:2,ALBUMIN:2 in the last 72 hours No results found for this basename: LIPASE:2,AMYLASE:2 in the last 72 hours  Basename 05/21/12 0323  WBC 11.0*  NEUTROABS --  HGB 12.8*  HCT 39.0  MCV 90.5  PLT 176    Basename 05/21/12 0322  CKTOTAL 726*  CKMB 4.3*  CKMBINDEX --  TROPONINI <0.30   No results found for this basename: TSH,T4TOTAL,FREET3,T3FREE,THYROIDAB in the last 72 hours No results found for this basename: VITAMINB12:2,FOLATE:2,FERRITIN:2,TIBC:2,IRON:2,RETICCTPCT:2 in the last 72 hours No results found for this basename: HGBA1C    CrCl is unknown because there is no height on file for the current visit. ABG    Component Value Date/Time   PHART 7.391 02/08/2009 0417   HCO3 22.7 02/08/2009 0417   TCO2 24 02/08/2009 0417   ACIDBASEDEF 2.0 02/08/2009 0417   O2SAT 97.0 02/08/2009 0417     Lab Results  Component Value Date   DDIMER  Value: 0.66        AT THE INHOUSE ESTABLISHED CUTOFF VALUE OF 0.48 ug/mL FEU, THIS ASSAY HAS BEEN DOCUMENTED IN THE LITERATURE TO HAVE A SENSITIVITY AND NEGATIVE PREDICTIVE VALUE OF AT LEAST 98 TO 99%.  THE TEST RESULT SHOULD BE CORRELATED WITH AN ASSESSMENT OF THE CLINICAL PROBABILITY OF DVT / VTE.* 02/16/2011     Other results:  I have pearsonaly reviewed this: ECG REPORT  Rate: 56  Rhythm: sinus rhythm ST&T Change: no ischemic changes    Cultures: No results found for this basename: sdes,  specrequest, cult, reptstatus       Radiological Exams on Admission: No results found.  Assessment/Plan  61 yo w hx of CAD here with syncopal episode in the setting of hx of CAD  Present on Admission:  .Syncope - will admit to tele, monitor, serial ECG, echo, carotid dopplers would benefit from cardiolog consult with dr. Halina Andreas cards .CAD (coronary artery disease) will cycle ce   Prophylaxis:  Lovenox  CODE STATUS: FULL CODE  Other plan as per orders.  I have spent a total of 55 min on this admission  Dennise Raabe 05/21/2012, 5:51 AM

## 2012-05-21 NOTE — ED Notes (Addendum)
Per EMS,  EMS came in the room and the patient was extremely diaphoretic. No CP, n/v, no SOB. He became dizzy. Gave 324 ASA and 1 Nitroglycerin. After Nitro, dizziness went away. Per pts wife, pt was sitting in the kitchen and he passed out. He was sweaty. She stated that it lasted for . She stated that it happened 2 more times. 12 Lead NSR. Vitals: 140/88, 73,18,97%on 2L N/C. R Hand 20g IV

## 2012-05-22 DIAGNOSIS — I2510 Atherosclerotic heart disease of native coronary artery without angina pectoris: Secondary | ICD-10-CM

## 2012-05-22 DIAGNOSIS — R55 Syncope and collapse: Secondary | ICD-10-CM

## 2012-05-22 LAB — COMPREHENSIVE METABOLIC PANEL
Alkaline Phosphatase: 51 U/L (ref 39–117)
BUN: 16 mg/dL (ref 6–23)
Calcium: 9.2 mg/dL (ref 8.4–10.5)
Creatinine, Ser: 1.29 mg/dL (ref 0.50–1.35)
GFR calc Af Amer: 67 mL/min — ABNORMAL LOW (ref >90)
Glucose, Bld: 91 mg/dL (ref 70–99)
Potassium: 5 mEq/L (ref 3.5–5.1)
Total Protein: 6.7 g/dL (ref 6.0–8.3)

## 2012-05-22 LAB — CBC
HCT: 35.9 % — ABNORMAL LOW (ref 39.0–52.0)
Platelets: 179 10*3/uL (ref 150–400)
RDW: 13.6 % (ref 11.5–15.5)
WBC: 5.9 10*3/uL (ref 4.0–10.5)

## 2012-05-22 LAB — HEMOGLOBIN A1C: Mean Plasma Glucose: 134 mg/dL — ABNORMAL HIGH (ref <117)

## 2012-05-22 LAB — MAGNESIUM: Magnesium: 2.2 mg/dL (ref 1.5–2.5)

## 2012-05-22 LAB — VITAMIN B12: Vitamin B-12: 412 pg/mL (ref 211–911)

## 2012-05-22 NOTE — Discharge Instructions (Signed)
Near-Syncope Near-syncope is sudden weakness, dizziness, or feeling like you might pass out (faint). This may occur when getting up after sitting or while standing for a long period of time. Near-syncope can be caused by a drop in blood pressure. This is a common reaction, but it may occur to a greater degree in people taking medicines to control their blood pressure. Fainting often occurs when the blood pressure or pulse is too low to provide enough blood flow to the brain to keep you conscious. Fainting and near-syncope are not usually due to serious medical problems. However, certain people should be more cautious in the event of near-syncope, including elderly patients, patients with diabetes, and patients with a history of heart conditions (especially irregular rhythms).  CAUSES   Drop in blood pressure.   Physical pain.   Dehydration.   Heat exhaustion.   Emotional distress.   Low blood sugar.   Internal bleeding.   Heart and circulatory problems.   Infections.  SYMPTOMS   Dizziness.   Feeling sick to your stomach (nauseous).   Nearly fainting.   Body numbness.   Turning pale.   Tunnel vision.   Weakness.  HOME CARE INSTRUCTIONS   Lie down right away if you start feeling like you might faint. Breathe deeply and steadily. Wait until all the symptoms have passed. Most of these episodes last only a few minutes. You may feel tired for several hours.   Drink enough fluids to keep your urine clear or pale yellow.   If you are taking blood pressure or heart medicine, get up slowly, taking several minutes to sit and then stand. This can reduce dizziness that is caused by a drop in blood pressure.  SEEK IMMEDIATE MEDICAL CARE IF:   You have a severe headache.   Unusual pain develops in the chest, abdomen, or back.   There is bleeding from the mouth or rectum, or you have black or tarry stool.   An irregular heartbeat or a very rapid pulse develops.   You have  repeated fainting or seizure-like jerking during an episode.   You faint when sitting or lying down.   You develop confusion.   You have difficulty walking.   Severe weakness develops.   Vision problems develop.  MAKE SURE YOU:   Understand these instructions.   Will watch your condition.   Will get help right away if you are not doing well or get worse.      Rhabdomyolysis Rhabdomyolysis is the breakdown of muscle fibers due to injury. The injury may come from physical damage to the muscle like an injury but other causes are:  High fever (hyperthermia).   Seizures (convulsions).   Low phosphate levels.   Diseases of metabolism.   Heatstroke.   Drug toxicity.   Over exertion.   Alcoholism.   Muscle is cut off from oxygen (anoxia).   The squeezing of nerves and blood vessels (compartment syndrome).  Some drugs which may cause the breakdown of muscle are:  Antibiotics.   Statins.   Alcohol.   Animal toxins.  Myoglobin is a substance which helps muscle use oxygen. When the muscle is damaged, the myoglobin is released into the bloodstream. It is filtered out of the bloodstream by the kidneys. Myoglobin may block up the kidneys. This may cause damage, such as kidney failure. It also breaks down into other damaging toxic parts, which also cause kidney failure.  SYMPTOMS   Dark, red, or tea colored urine.   Weakness  of affected muscles.   Weight gain from water retention.   Joint aches and pains.   Irregular heart from high potassium in the blood.   Muscle tenderness or aching.   Generalized weakness.   Seizures.   Feeling tired (fatigue).  DIAGNOSIS  Your caregiver may find muscle tenderness on exam and suspect the problem. Urine tests and blood work can confirm the problem. TREATMENT   Early and aggressive treatment with large amounts of fluids may help prevent kidney failure.   Water producing medicine (diuretic) may be used to help flush  the kidneys.   High potassium and calcium problems (electrolyte) in your blood may need treatment.  HOME CARE INSTRUCTIONS  This problem is usually cared for in a hospital. If you are allowed to go home and require dialysis, make sure you keep all appointments for lab work and dialysis. Not doing so could result in death.

## 2012-05-22 NOTE — Progress Notes (Signed)
Utilization Review Completed.[name] T6/09/2012   

## 2012-05-22 NOTE — Discharge Summary (Signed)
Physician Discharge Summary  Patient ID: Kurt Williams MRN: [id number] DOB/AGE: 63-Oct-1952 63 y.o.  Admit date: 05/21/2012 Discharge date: 05/22/2012  Primary Care Physician:  Willey Blade, MD, MD   Discharge Diagnoses:   1-Near-syncope/syncope 2-Mild rhabdomyolysis  3-Hx of CAD (coronary artery disease) 4-Sinus bradycardia   Medication List  As of 05/22/2012  9:30 AM   TAKE these medications         aspirin 81 MG chewable tablet   Chew 81 mg by mouth daily.      Cod Liver Oil Caps   Take 1 capsule by mouth daily.      omega-3 acid ethyl esters 1 G capsule   Commonly known as: LOVAZA   Take 1 g by mouth daily.      OVER THE COUNTER MEDICATION   Take 1 tablet by mouth daily as needed. Allergy medication OTC             Disposition and Follow-up:  Patient discharge in stable and improved condition; no further near syncope episodes, CP, SOB or any other complaints. Patient endorses that he will like to follow with Dr. Katrinka Blazing (cardiology) for 2-D echo and to check himself from CAD history. Patient advised to avoid excessive heat exposure and to keep himself well hydrated. Near syncope due to orthostasis and mild rhabdomyolysis.  Consults:  None   Significant Diagnostic Studies:  Dg Chest 2 View  05/21/2012  *RADIOLOGY REPORT*  Clinical Data: Syncope.  History of coronary artery disease.  CHEST - 2 VIEW  Comparison: 02/16/2011 and 12/19/2009  Findings: Two views of the chest were obtained.  There is stable elevation of the left hemidiaphragm.  Prominent interstitial lung markings are similar to prior exams.  Heart size is within normal limits for size.  Trachea is midline.  No evidence for pleural effusions. A few densities at the left lung base are suggestive for vascular markings and possibly atelectasis.  IMPRESSION: Prominent interstitial lung markings.  Suspect that this represents chronic changes rather than edema.  Probable left basilar atelectasis.  Original Report  Authenticated By: Richarda Overlie, M.D.    Brief H and P: For complete details please refer to admission H and P, but in brief 61 y/o male with pmh of bradycardia (sinus) and CAD in the past who came to the hospital due to sudden onset of near-syncope/syncope without any prodromal state, woke up after 1 min and was clammy and sweating no SOB. EMT checked BP and told him it was ok, BG also checked and was normal. He had a very busy few days and have been excessively physical active for the last 48hours prior to admission. No seizure activity no incontinence. He have been athletic all his life and have and slow heart rate and low normal blood pressure. Denies any chest pain.      Hospital Course:  1-Near-syncope/syncope: most likely due to orthostatic changes and dehydration after excessive physical activity lately; telemetry just showing sinus bradycardia, EKG w/o ischemic changes; Cardiac enzymes and CXR WNL. After discussing findings with patient and response to hydration he decided to follow with Dr. Katrinka Blazing as an outpatient for CAD follow up check and 2-D echo. Patient advised to avoid excessive heat exposure and to keep himself hydrated.  2-Mild rhabdomyolysis: CK total in the 700 range. Patient received IVF resuscitation during admission and instructed to keep himself hydrated.  3-Hx of CAD (coronary artery disease): no ischemic changes on EKG, negative cardiac enzymes except for elevated CK total and telemetry  just showing sinus bradycardia. No CP or SOB. Will continue ASA and will follow with cardiologist as an outpatient.  4-Sinus bradycardia: stable. Hx of high athletic performance when young; no other telemetry abnormality appreciated.    Time spent on Discharge: 40 minutes  Signed: Aleigh Grunden 05/22/2012, 9:30 AM

## 2013-10-20 ENCOUNTER — Encounter: Payer: Self-pay | Admitting: Interventional Cardiology

## 2013-10-20 DIAGNOSIS — R51 Headache: Secondary | ICD-10-CM | POA: Insufficient documentation

## 2013-10-20 DIAGNOSIS — R519 Headache, unspecified: Secondary | ICD-10-CM | POA: Insufficient documentation

## 2013-10-20 DIAGNOSIS — K219 Gastro-esophageal reflux disease without esophagitis: Secondary | ICD-10-CM | POA: Insufficient documentation

## 2013-10-20 DIAGNOSIS — I2510 Atherosclerotic heart disease of native coronary artery without angina pectoris: Secondary | ICD-10-CM | POA: Insufficient documentation

## 2013-10-24 ENCOUNTER — Ambulatory Visit (INDEPENDENT_AMBULATORY_CARE_PROVIDER_SITE_OTHER): Payer: 59 | Admitting: Interventional Cardiology

## 2013-10-24 ENCOUNTER — Encounter: Payer: Self-pay | Admitting: Interventional Cardiology

## 2013-10-24 VITALS — BP 114/62 | Ht 71.0 in | Wt 246.4 lb

## 2013-10-24 DIAGNOSIS — R55 Syncope and collapse: Secondary | ICD-10-CM

## 2013-10-24 DIAGNOSIS — I2510 Atherosclerotic heart disease of native coronary artery without angina pectoris: Secondary | ICD-10-CM

## 2013-10-24 NOTE — Patient Instructions (Signed)
Your physician recommends that you continue on your current medications as directed. Please refer to the Current Medication list given to you today.  Follow up as needed  

## 2013-10-24 NOTE — Progress Notes (Signed)
Patient ID: Kurt Williams, male   DOB: 09-10-1951, 62 y.o.   MRN: [id number]    [address], [address] [place], [place]  [address] Phone: [phone] Fax:  [phone]  Date:  10/24/2013   ID:  Kurt Williams, DOB [birth year], MRN [id number]  PCP:  August Saucer ERIC, MD   ASSESSMENT:  1. Syncope without recurrence 2. Coronary disease asymptomatic  PLAN:  1. Observation 2. No restrictions on activities   SUBJECTIVE: Kurt Williams is a 62 y.o. male who follows up for an episode of syncope earlier this year. An etiology was found. No recurrence or symptoms of near-syncope have occurred. He has remained active. He denies angina. He was under significant emotional stress at the time of the episode. The stress is somewhat improved.   Wt Readings from Last 3 Encounters:  10/24/13 246 lb 6.4 oz (111.766 kg)  05/21/12 234 lb 8 oz (106.369 kg)     Past Medical History  Diagnosis Date  . Coronary artery disease   . GERD (gastroesophageal reflux disease)   . Headache(784.0)   . Migraine     Current Outpatient Prescriptions  Medication Sig Dispense Refill  . aspirin 81 MG chewable tablet Chew 81 mg by mouth daily.      Marland Kitchen Cod Liver Oil CAPS Take 1 capsule by mouth daily.      Marland Kitchen NITROSTAT 0.4 MG SL tablet       . OVER THE COUNTER MEDICATION Take 1 tablet by mouth daily as needed. Allergy medication OTC       No current facility-administered medications for this visit.    Allergies:   No Known Allergies  Social History:  The patient  reports that he has been smoking Cigarettes.  He has been smoking about 0.00 packs per day for the past 50 years. He does not have any smokeless tobacco history on file. He reports that he drinks alcohol. He reports that he does not use illicit drugs.   ROS:  Please see the history of present illness.   All other systems reviewed and negative.   OBJECTIVE: VS:  BP 114/62  Ht 5\' 11"  (1.803 m)  Wt 246 lb 6.4 oz (111.766 kg)  BMI 34.38 kg/m2 Well  nourished, well developed, in no acute distress, healthy HEENT: normal Neck: JVD flat. Carotid bruit absent  Cardiac:  normal S1, S2; RRR; no murmur Lungs:  clear to auscultation bilaterally, no wheezing, rhonchi or rales Abd: soft, nontender, no hepatomegaly Ext: Edema absent. Pulses 2+ Skin: warm and dry Neuro:  CNs 2-12 intact, no focal abnormalities noted  EKG:  Sinus bradycardia but otherwise normal       Signed, Darci Needle III, MD 10/24/2013 4:55 PM

## 2014-03-11 ENCOUNTER — Encounter: Payer: Self-pay | Admitting: Internal Medicine

## 2014-03-26 DIAGNOSIS — H66009 Acute suppurative otitis media without spontaneous rupture of ear drum, unspecified ear: Secondary | ICD-10-CM | POA: Diagnosis not present

## 2014-03-26 DIAGNOSIS — I10 Essential (primary) hypertension: Secondary | ICD-10-CM | POA: Diagnosis not present

## 2014-03-26 DIAGNOSIS — J301 Allergic rhinitis due to pollen: Secondary | ICD-10-CM | POA: Diagnosis not present

## 2014-04-05 DIAGNOSIS — E559 Vitamin D deficiency, unspecified: Secondary | ICD-10-CM | POA: Diagnosis not present

## 2014-04-05 DIAGNOSIS — Z125 Encounter for screening for malignant neoplasm of prostate: Secondary | ICD-10-CM | POA: Diagnosis not present

## 2014-04-05 DIAGNOSIS — E785 Hyperlipidemia, unspecified: Secondary | ICD-10-CM | POA: Diagnosis not present

## 2014-04-05 DIAGNOSIS — R7309 Other abnormal glucose: Secondary | ICD-10-CM | POA: Diagnosis not present

## 2014-04-05 DIAGNOSIS — I10 Essential (primary) hypertension: Secondary | ICD-10-CM | POA: Diagnosis not present

## 2014-04-29 DIAGNOSIS — H902 Conductive hearing loss, unspecified: Secondary | ICD-10-CM | POA: Diagnosis not present

## 2014-04-29 DIAGNOSIS — H6120 Impacted cerumen, unspecified ear: Secondary | ICD-10-CM | POA: Diagnosis not present

## 2014-04-29 DIAGNOSIS — H6980 Other specified disorders of Eustachian tube, unspecified ear: Secondary | ICD-10-CM | POA: Diagnosis not present

## 2014-05-26 ENCOUNTER — Emergency Department (INDEPENDENT_AMBULATORY_CARE_PROVIDER_SITE_OTHER)
Admission: EM | Admit: 2014-05-26 | Discharge: 2014-05-26 | Disposition: A | Discharge status: Home / Self Care | Payer: Medicare Other | Source: Home / Self Care | Attending: Emergency Medicine | Admitting: Emergency Medicine

## 2014-05-26 ENCOUNTER — Encounter (HOSPITAL_COMMUNITY): Payer: Self-pay | Admitting: Emergency Medicine

## 2014-05-26 DIAGNOSIS — N200 Calculus of kidney: Secondary | ICD-10-CM

## 2014-05-26 LAB — POCT URINALYSIS DIP (DEVICE)
BILIRUBIN URINE: NEGATIVE
Glucose, UA: NEGATIVE mg/dL
KETONES UR: NEGATIVE mg/dL
LEUKOCYTES UA: NEGATIVE
Nitrite: NEGATIVE
PH: 8 (ref 5.0–8.0)
PROTEIN: NEGATIVE mg/dL
Specific Gravity, Urine: 1.015 (ref 1.005–1.030)
Urobilinogen, UA: 0.2 mg/dL (ref 0.0–1.0)

## 2014-05-26 NOTE — Discharge Instructions (Signed)
Diet for Kidney Stones Kidney stones are small, hard masses that form inside your kidneys. They are made up of salts and minerals and often form when high levels build up in the urine. The minerals can then start to build up, crystalize, and stick together to form stones. There are several different types of kidney stones. The following types of stones may be influenced by dietary factors:   Calcium Oxalate Stones. An oxalate is a salt found in certain foods. Within the body, calcium can combine with oxalates to form calcium oxalate stones, which can be excreted in the urine in high amounts. This is the most common type of kidney stone.  Calcium Phosphate Stones. These stones may occur when the pH of the urine becomes too high, or less acidic, from too much calcium being excreted in the urine. The pH is a measure of how acidic or basic a substance is.  Uric Acid Stones. This type of stone occurs when the pH of the urine becomes too low, or very acidic, because substances called purines build up in the urine. Purines are found in animal proteins. When the urine is highly concentrated with acid, uric acid kidney stones can form.  Other risk factors for kidney stones include genetics, environment, and being overweight. Your caregiver may ask you to follow specific diet guidelines based on the type of stone you have to lessen the chances of your body making more kidney stones.  GENERAL GUIDELINES FOR ALL TYPES OF STONES  Drink plenty of fluid. Drink 12 16 cups of fluid a day, drinking mainly water.This is the most important thing you can do to prevent the formation of future kidney stones.  Maintain a healthy weight. Your caregiver or dietitian can help you determine what a healthy weight is for you. If you are overweight, weight loss may help prevent the formation of future kidney stones.  Eat a diet adequate in animal protein. Too much animal protein can contribute to the formation of stones. Your  dietitian can help you determine how much protein you should be eating. Avoid low carbohydrate, high protein diets.  Follow a balanced eating approach. The DASH diet, which stands for "Dietary Approaches to Stop Hypertension," is an effective meal plan for reducing stone formation. This diet is high in fruits, vegetables, dairy, and whole grains and low in animal protein. Ask your caregiver or dietitian for information about the DASH diet. ADDITIONAL DIET GUIDELINES FOR CALCIUM STONES Avoid foods high in salt. This includes table salt, salt seasonings, MSG, soy sauce, cured and processed meats, salted crackers and snack foods, fast food, and canned soups and foods. Ask your caregiver or dietitian for information about reducing sodium in your diet or following the low sodium diet.  Ensure adequate calcium intake. Use the following table for calcium guidelines:  Men 42 years old and younger  1000 mg/day.  Men 54 years old and older  1500 mg/day.  Women 93 65 years old  1000 mg/day.  Women 50 years and older  1500 mg/day. Your dietitian can help you determine if you are getting enough calcium in your diet. Foods that are high in calcium include dairy products, broccoli, cheese, yogurt, and pudding. If you need to take a calcium supplement, take it only in the form of calcium citrate.  Avoid foods high in oxalate. Be sure that any supplements you take do not contain more than 500 mg of vitamin C. Vitamin C is converted into oxalate in the body. You do  not need to avoid fruits and vegetables high in vitamin C.  °· Grains: High-fiber or bran cereal, whole-wheat bread, grits, barley, buckwheat, amaranth, pretzels, and fruitcake. °· Vegetables: Dried beans, wax beans, dark leafy greens, eggplant, leeks, okra, parsley, rutabaga, tomato paste, watercress, zucchini, and escarole. °· Fruit: Dried apricots, red currants, figs, kiwi, and rhubarb. °· Meat and Meat Substitutes: Soybeans and foods made from soy  (soyburger, miso), dried beans, peanut butter. °· Milk: Chocolate milk mixes and soymilk. °· Fats and Oils: Nuts (peanuts, almonds, pecans, cashews, hazelnuts) and nut butters, sesame seeds, and tDahini paste. °· Condiments/Miscellaneous: Chocolate, carob, marmalade, poppy seeds, instant iced tea, and juice from high-oxalate fruits.    °Document Released: 03/26/2011 Document Revised: 05/30/2012 Document Reviewed: 05/15/2012 °[employer]® Patient Information ©2014 [employer]. °Kidney Stones °Kidney stones (urolithiasis) are deposits that form inside your kidneys. The intense pain is caused by the stone moving through the urinary tract. When the stone moves, the ureter goes into spasm around the stone. The stone is usually passed in the urine.  °CAUSES  °· A disorder that makes certain neck glands produce too much parathyroid hormone (primary hyperparathyroidism). °· A buildup of uric acid crystals, similar to gout in your joints. °· Narrowing (stricture) of the ureter. °· A kidney obstruction present at birth (congenital obstruction). °· Previous surgery on the kidney or ureters. °· Numerous kidney infections. °SYMPTOMS  °· Feeling sick to your stomach (nauseous). °· Throwing up (vomiting). °· Blood in the urine (hematuria). °· Pain that usually spreads (radiates) to the groin. °· Frequency or urgency of urination. °DIAGNOSIS  °· Taking a history and physical exam. °· Blood or urine tests. °· CT scan. °· Occasionally, an examination of the inside of the urinary bladder (cystoscopy) is performed. °TREATMENT  °· Observation. °· Increasing your fluid intake. °· Extracorporeal shock wave lithotripsy This is a noninvasive procedure that uses shock waves to break up kidney stones. °· Surgery may be needed if you have severe pain or persistent obstruction. There are various surgical procedures. Most of the procedures are performed with the use of small instruments. Only small incisions are needed to accommodate these  instruments, so recovery time is minimized. °The size, location, and chemical composition are all important variables that will determine the proper choice of action for you. Talk to your health care provider to better understand your situation so that you will minimize the risk of injury to yourself and your kidney.  °HOME CARE INSTRUCTIONS  °· Drink enough water and fluids to keep your urine clear or pale yellow. This will help you to pass the stone or stone fragments. °· Strain all urine through the provided strainer. Keep all particulate matter and stones for your health care provider to see. The stone causing the pain may be as small as a grain of salt. It is very important to use the strainer each and every time you pass your urine. The collection of your stone will allow your health care provider to analyze it and verify that a stone has actually passed. The stone analysis will often identify what you can do to reduce the incidence of recurrences. °· Only take over-the-counter or prescription medicines for pain, discomfort, or fever as directed by your health care provider. °· Make a follow-up appointment with your health care provider as directed. °· Get follow-up X-rays if required. The absence of pain does not always mean that the stone has passed. It may have only stopped moving. If the urine remains completely obstructed, it can   can cause loss of kidney function or even complete destruction of the kidney. It is your responsibility to make sure X-rays and follow-ups are completed. Ultrasounds of the kidney can show blockages and the status of the kidney. Ultrasounds are not associated with any radiation and can be performed easily in a matter of minutes. SEEK MEDICAL CARE IF:  You experience pain that is progressive and unresponsive to any pain medicine you have been prescribed. SEEK IMMEDIATE MEDICAL CARE IF:   Pain cannot be controlled with the prescribed medicine.  You have a fever or shaking  chills.  The severity or intensity of pain increases over 18 hours and is not relieved by pain medicine.  You develop a new onset of abdominal pain.  You feel faint or pass out.  You are unable to urinate. MAKE SURE YOU:   Understand these instructions.  Will watch your condition.  Will get help right away if you are not doing well or get worse. Document Released: 11/29/2005 Document Revised: 08/01/2013 Document Reviewed: 05/02/2013 [facility] Patient Information 2014 [place].  Ureteral Colic (Kidney Stones) Ureteral colic is the result of a condition when kidney stones form inside the kidney. Once kidney stones are formed they may move into the tube that connects the kidney with the bladder (ureter). If this occurs, this condition may cause pain (colic) in the ureter.  CAUSES  Pain is caused by stone movement in the ureter and the obstruction caused by the stone. SYMPTOMS  The pain comes and goes as the ureter contracts around the stone. The pain is usually intense, sharp, and stabbing in character. The location of the pain may move as the stone moves through the ureter. When the stone is near the kidney the pain is usually located in the back and radiates to the belly (abdomen). When the stone is ready to pass into the bladder the pain is often located in the lower abdomen on the side the stone is located. At this location, the symptoms may mimic those of a urinary tract infection with urinary frequency. Once the stone is located here it often passes into the bladder and the pain disappears completely. TREATMENT   Your caregiver will provide you with medicine for pain relief.  You may require specialized follow-up X-rays.  The absence of pain does not always mean that the stone has passed. It may have just stopped moving. If the urine remains completely obstructed, it can cause loss of kidney function or even complete destruction of the involved kidney. It is your  responsibility and in your interest that X-rays and follow-ups as suggested by your caregiver are completed. Relief of pain without passage of the stone can be associated with severe damage to the kidney, including loss of kidney function on that side.  If your stone does not pass on its own, additional measures may be taken by your caregiver to ensure its removal. HOME CARE INSTRUCTIONS   Increase your fluid intake. Water is the preferred fluid since juices containing vitamin C may acidify the urine making it less likely for certain stones (uric acid stones) to pass.  Strain all urine. A strainer will be provided. Keep all particulate matter or stones for your caregiver to inspect.  Take your pain medicine as directed.  Make a follow-up appointment with your caregiver as directed.  Remember that the goal is passage of your stone. The absence of pain does not mean the stone is gone. Follow your caregiver's instructions.  Only take over-the-counter  or prescription medicines for pain, discomfort, or fever as directed by your caregiver. SEEK MEDICAL CARE IF:   Pain cannot be controlled with the prescribed medicine.  You have a fever.  Pain continues for longer than your caregiver advises it should.  There is a change in the pain, and you develop chest discomfort or constant abdominal pain.  You feel faint or pass out. MAKE SURE YOU:   Understand these instructions.  Will watch your condition.  Will get help right away if you are not doing well or get worse. Document Released: 09/08/2005 Document Revised: 03/26/2013 Document Reviewed: 05/26/2011 [facility] Patient Information 2014 [place], [place].

## 2014-05-26 NOTE — ED Provider Notes (Signed)
CSN: [id number]     Arrival date & time 05/26/14  1117 History   First MD Initiated Contact with Patient 05/26/14 1217     Chief Complaint  Patient presents with  . Abdominal Pain   (Consider location/radiation/quality/duration/timing/severity/associated sxs/prior Treatment) HPI Comments: Patient states that when pain occurred, it was difficulty for him to sit still and he became mildly nauseated and diaphoretic. No relief with BM prior to arrival  Patient is a 63 y.o. male presenting with flank pain. The history is provided by the patient.  Flank Pain This is a new problem. Episode onset: Began abruptly at 930am while patient was at church this morning. The problem has been resolved (Patient states he has sudden relief of his symptoms while in the waiting room at [facility]). Pertinent negatives include no chest pain, no abdominal pain, no headaches and no shortness of breath. Associated symptoms comments: Left flank pain.    Past Medical History  Diagnosis Date  . Coronary artery disease   . GERD (gastroesophageal reflux disease)   . Headache(784.0)   . Migraine    Past Surgical History  Procedure Laterality Date  . Coronary angioplasty with stent placement  2000  . Abdominal sugery  2011    to lower organs on left side that were pressing on diaphragm  . Knee arthroscopy w/ acl reconstruction  1973    Left knee  . Knee arthroscopy  1963    on Right Knee with removal of pieces of shattered bone   Family History  Problem Relation Age of Onset  . Hypertension Mother   . Cancer Mother     multiple meyloma  . Cancer Father     stomach cancer  . Diabetes Father    History  Substance Use Topics  . Smoking status: Current Some Day Smoker -- 50 years    Types: Cigarettes  . Smokeless tobacco: Not on file  . Alcohol Use: Yes     Comment: occasionaly    Review of Systems  Constitutional: Negative.   HENT: Negative.   Eyes: Negative.   Respiratory: Negative for cough, chest  tightness, shortness of breath and wheezing.   Cardiovascular: Negative for chest pain.  Gastrointestinal: Positive for nausea. Negative for vomiting, abdominal pain, diarrhea and constipation.  Genitourinary: Positive for flank pain. Negative for dysuria, urgency, frequency, hematuria, discharge, penile swelling, scrotal swelling, enuresis, difficulty urinating, genital sores, penile pain and testicular pain.  Musculoskeletal: Positive for back pain.  Skin: Negative.   Neurological: Negative for dizziness, syncope and headaches.    Allergies  Review of patient's allergies indicates no known allergies.  Home Medications   Prior to Admission medications   Medication Sig Start Date End Date Taking? Authorizing Provider  aspirin 81 MG chewable tablet Chew 81 mg by mouth daily.    Historical Provider, MD  [facility] Liver Oil CAPS Take 1 capsule by mouth daily.    Historical Provider, MD  NITROSTAT 0.4 MG SL tablet  09/13/13   Historical Provider, MD  OVER THE COUNTER MEDICATION Take 1 tablet by mouth daily as needed. Allergy medication OTC    Historical Provider, MD   BP 127/85  Pulse 58  Temp(Src) 97.7 F (36.5 C) (Oral)  Resp 18  SpO2 98% Physical Exam  Nursing note and vitals reviewed. Constitutional: He is oriented to person, place, and time. He appears well-developed and well-nourished. No distress.  HENT:  Head: Normocephalic and atraumatic.  Eyes: Conjunctivae are normal. No scleral icterus.  Cardiovascular: Normal rate,  regular rhythm and normal heart sounds.   Pulmonary/Chest: Effort normal and breath sounds normal. No respiratory distress. He has no wheezes.  Abdominal: Soft. Bowel sounds are normal. He exhibits no distension. There is no tenderness. There is no CVA tenderness. Hernia confirmed negative in the right inguinal area and confirmed negative in the left inguinal area.  Musculoskeletal: Normal range of motion.  Neurological: He is alert and oriented to person, place, and  time.  Skin: Skin is warm and dry. No rash noted. No erythema.  Psychiatric: He has a normal mood and affect. His behavior is normal.    ED Course  Procedures (including critical care time) Labs Review Labs Reviewed  POCT URINALYSIS DIP (DEVICE) - Abnormal; Notable for the following:    Hgb urine dipstick TRACE (*)    All other components within normal limits    Imaging Review No results found.   MDM   1. Kidney stone on left side    UA with trace hematuria. Given that patient had both abrupt onset and abrupt relief of symptoms consistent with ureteral colic and has trace hematuria, I suspect he is in the process of passing a small kidney stone. Advised patient that stone has likely fallen into the bladder and will either dissolve in his urine or be passed over the next few days. Given strainer to use at home and advised to return if symptoms reoccur or he develops fever, gross hematuria or inability to pass urine.   [place], [place] 05/26/14 1314

## 2014-05-26 NOTE — ED Provider Notes (Signed)
Medical screening examination/treatment/procedure(s) were performed by non-physician practitioner and as supervising physician I was immediately available for consultation/collaboration.  Philipp Deputy, M.D.  Harden Mo, MD 05/26/14 [phone]

## 2014-05-26 NOTE — ED Notes (Signed)
Pt  Reports  l  Sided  abd  Pain  Radiating  To  l  Flank         descibed  As  Burning    Sensation    Pt  Reports  Pain  Developed  About  3  Hrs  Ago  - he  Took  Some  Anti  Acids   Which  releived  The  Pain  Which at this  Time  Is  Gone

## 2014-06-06 DIAGNOSIS — M1990 Unspecified osteoarthritis, unspecified site: Secondary | ICD-10-CM | POA: Diagnosis not present

## 2014-06-06 DIAGNOSIS — Z8679 Personal history of other diseases of the circulatory system: Secondary | ICD-10-CM | POA: Diagnosis not present

## 2014-06-06 DIAGNOSIS — I10 Essential (primary) hypertension: Secondary | ICD-10-CM | POA: Diagnosis not present

## 2014-06-06 DIAGNOSIS — N200 Calculus of kidney: Secondary | ICD-10-CM | POA: Diagnosis not present

## 2014-06-11 DIAGNOSIS — Z1211 Encounter for screening for malignant neoplasm of colon: Secondary | ICD-10-CM | POA: Diagnosis not present

## 2014-06-11 DIAGNOSIS — K621 Rectal polyp: Secondary | ICD-10-CM | POA: Diagnosis not present

## 2014-06-11 DIAGNOSIS — D126 Benign neoplasm of colon, unspecified: Secondary | ICD-10-CM | POA: Diagnosis not present

## 2014-09-03 DIAGNOSIS — L821 Other seborrheic keratosis: Secondary | ICD-10-CM | POA: Diagnosis not present

## 2014-09-03 DIAGNOSIS — D239 Other benign neoplasm of skin, unspecified: Secondary | ICD-10-CM | POA: Diagnosis not present

## 2014-09-16 DIAGNOSIS — H04123 Dry eye syndrome of bilateral lacrimal glands: Secondary | ICD-10-CM | POA: Diagnosis not present

## 2014-09-16 DIAGNOSIS — H40013 Open angle with borderline findings, low risk, bilateral: Secondary | ICD-10-CM | POA: Diagnosis not present

## 2014-09-16 DIAGNOSIS — H2513 Age-related nuclear cataract, bilateral: Secondary | ICD-10-CM | POA: Diagnosis not present

## 2014-09-16 DIAGNOSIS — H25013 Cortical age-related cataract, bilateral: Secondary | ICD-10-CM | POA: Diagnosis not present

## 2015-01-13 ENCOUNTER — Emergency Department (HOSPITAL_BASED_OUTPATIENT_CLINIC_OR_DEPARTMENT_OTHER)
Admission: EM | Admit: 2015-01-13 | Discharge: 2015-01-13 | Disposition: A | Discharge status: Home / Self Care | Payer: Medicare Other | Attending: Emergency Medicine | Admitting: Emergency Medicine

## 2015-01-13 ENCOUNTER — Encounter (HOSPITAL_BASED_OUTPATIENT_CLINIC_OR_DEPARTMENT_OTHER): Payer: Self-pay | Admitting: *Deleted

## 2015-01-13 DIAGNOSIS — Z8719 Personal history of other diseases of the digestive system: Secondary | ICD-10-CM | POA: Diagnosis not present

## 2015-01-13 DIAGNOSIS — Z7982 Long term (current) use of aspirin: Secondary | ICD-10-CM | POA: Diagnosis not present

## 2015-01-13 DIAGNOSIS — L259 Unspecified contact dermatitis, unspecified cause: Secondary | ICD-10-CM | POA: Insufficient documentation

## 2015-01-13 DIAGNOSIS — I2510 Atherosclerotic heart disease of native coronary artery without angina pectoris: Secondary | ICD-10-CM | POA: Diagnosis not present

## 2015-01-13 DIAGNOSIS — Z9861 Coronary angioplasty status: Secondary | ICD-10-CM | POA: Insufficient documentation

## 2015-01-13 DIAGNOSIS — L299 Pruritus, unspecified: Secondary | ICD-10-CM | POA: Diagnosis present

## 2015-01-13 DIAGNOSIS — Z79899 Other long term (current) drug therapy: Secondary | ICD-10-CM | POA: Diagnosis not present

## 2015-01-13 MED ORDER — PREDNISONE 10 MG PO TABS: ORAL_TABLET | ORAL | Status: DC

## 2015-01-13 NOTE — Discharge Instructions (Signed)

## 2015-01-13 NOTE — ED Provider Notes (Signed)
CSN: [id number]     Arrival date & time 01/13/15  1123 History   First MD Initiated Contact with Patient 01/13/15 1125     Chief Complaint  Patient presents with  . Skin Itching, left leg     (Consider location/radiation/quality/duration/timing/severity/associated sxs/prior Treatment) HPI Comments: Pt comes in with c/o itching to his left leg that started 3 days ago. Pt states that there is no pain. No known bite. States that he took benadryl with some relief, but it made him to sleepy. No known knew exposure.   The history is provided by the patient. No language interpreter was used.    Past Medical History  Diagnosis Date  . Coronary artery disease   . GERD (gastroesophageal reflux disease)   . Headache(784.0)   . Migraine    Past Surgical History  Procedure Laterality Date  . Coronary angioplasty with stent placement  2000  . Abdominal sugery  2011    to lower organs on left side that were pressing on diaphragm  . Knee arthroscopy w/ acl reconstruction  1973    Left knee  . Knee arthroscopy  1963    on Right Knee with removal of pieces of shattered bone   Family History  Problem Relation Age of Onset  . Hypertension Mother   . Cancer Mother     multiple meyloma  . Cancer Father     stomach cancer  . Diabetes Father    History  Substance Use Topics  . Smoking status: Never Smoker   . Smokeless tobacco: Not on file  . Alcohol Use: Yes     Comment: occasionaly    Review of Systems  All other systems reviewed and are negative.     Allergies  Review of patient's allergies indicates no known allergies.  Home Medications   Prior to Admission medications   Medication Sig Start Date End Date Taking? Authorizing Provider  aspirin 81 MG chewable tablet Chew 81 mg by mouth daily.    Historical Provider, MD  [facility] Liver Oil CAPS Take 1 capsule by mouth daily.    Historical Provider, MD  NITROSTAT 0.4 MG SL tablet  09/13/13   Historical Provider, MD  OVER THE COUNTER  MEDICATION Take 1 tablet by mouth daily as needed. Allergy medication OTC    Historical Provider, MD  predniSONE (DELTASONE) 10 MG tablet 6 day step down dose 01/13/15   Glendell Docker, NP   BP 138/80 mmHg  Pulse 62  Temp(Src) 98.7 F (37.1 C) (Oral)  Resp 18  Ht 6' (1.829 m)  Wt 240 lb (108.863 kg)  BMI 32.54 kg/m2  SpO2 97% Physical Exam  Constitutional: He is oriented to person, place, and time. He appears well-developed and well-nourished.  Cardiovascular: Normal rate and regular rhythm.   Pulmonary/Chest: Effort normal and breath sounds normal.  Neurological: He is alert and oriented to person, place, and time.  Skin:  Red raised papular rash to the left lower leg. No drainage or warmth noted.  Nursing note and vitals reviewed.   ED Course  Procedures (including critical care time) Labs Review Labs Reviewed - No data to display  Imaging Review No results found.   EKG Interpretation None      MDM   Final diagnoses:  Contact dermatitis    Think contact in nature. Doesn't look infected. Discussed use of antihistamine for itching. Placed on prednisone dose pack    Glendell Docker, NP 01/13/15 1151  Pamella Pert, MD 01/13/15 1241

## 2015-01-13 NOTE — ED Notes (Signed)
Itching on his left lower leg since Friday. He took Benadryl. Today it is warm to touch.

## 2015-01-14 DIAGNOSIS — L03116 Cellulitis of left lower limb: Secondary | ICD-10-CM | POA: Diagnosis not present

## 2015-02-04 DIAGNOSIS — L03129 Acute lymphangitis of unspecified part of limb: Secondary | ICD-10-CM | POA: Diagnosis not present

## 2015-02-13 DIAGNOSIS — L03129 Acute lymphangitis of unspecified part of limb: Secondary | ICD-10-CM | POA: Diagnosis not present

## 2015-02-13 DIAGNOSIS — J40 Bronchitis, not specified as acute or chronic: Secondary | ICD-10-CM | POA: Diagnosis not present

## 2015-03-27 ENCOUNTER — Encounter: Payer: Self-pay | Admitting: Internal Medicine

## 2015-04-01 DIAGNOSIS — J301 Allergic rhinitis due to pollen: Secondary | ICD-10-CM | POA: Diagnosis not present

## 2015-04-01 DIAGNOSIS — R1012 Left upper quadrant pain: Secondary | ICD-10-CM | POA: Diagnosis not present

## 2015-07-03 DIAGNOSIS — R1012 Left upper quadrant pain: Secondary | ICD-10-CM | POA: Diagnosis not present

## 2015-07-03 DIAGNOSIS — L03129 Acute lymphangitis of unspecified part of limb: Secondary | ICD-10-CM | POA: Diagnosis not present

## 2015-07-07 ENCOUNTER — Other Ambulatory Visit: Payer: Self-pay | Admitting: Internal Medicine

## 2015-07-07 DIAGNOSIS — R109 Unspecified abdominal pain: Secondary | ICD-10-CM

## 2015-07-08 ENCOUNTER — Encounter (HOSPITAL_COMMUNITY): Payer: Self-pay

## 2015-07-08 ENCOUNTER — Ambulatory Visit (HOSPITAL_COMMUNITY)
Admission: RE | Admit: 2015-07-08 | Discharge: 2015-07-08 | Disposition: A | Discharge status: Home / Self Care | Payer: Medicare Other | Source: Ambulatory Visit | Attending: Internal Medicine | Admitting: Internal Medicine

## 2015-07-08 DIAGNOSIS — R109 Unspecified abdominal pain: Secondary | ICD-10-CM | POA: Insufficient documentation

## 2015-09-24 DIAGNOSIS — Z125 Encounter for screening for malignant neoplasm of prostate: Secondary | ICD-10-CM | POA: Diagnosis not present

## 2015-09-24 DIAGNOSIS — L03129 Acute lymphangitis of unspecified part of limb: Secondary | ICD-10-CM | POA: Diagnosis not present

## 2015-09-24 DIAGNOSIS — J0101 Acute recurrent maxillary sinusitis: Secondary | ICD-10-CM | POA: Diagnosis not present

## 2015-09-24 DIAGNOSIS — D649 Anemia, unspecified: Secondary | ICD-10-CM | POA: Diagnosis not present

## 2015-09-24 DIAGNOSIS — I10 Essential (primary) hypertension: Secondary | ICD-10-CM | POA: Diagnosis not present

## 2015-09-24 DIAGNOSIS — J018 Other acute sinusitis: Secondary | ICD-10-CM | POA: Diagnosis not present

## 2015-09-24 DIAGNOSIS — E559 Vitamin D deficiency, unspecified: Secondary | ICD-10-CM | POA: Diagnosis not present

## 2015-10-08 DIAGNOSIS — M5413 Radiculopathy, cervicothoracic region: Secondary | ICD-10-CM | POA: Diagnosis not present

## 2015-10-27 DIAGNOSIS — M542 Cervicalgia: Secondary | ICD-10-CM | POA: Diagnosis not present

## 2015-10-30 ENCOUNTER — Other Ambulatory Visit: Payer: Self-pay | Admitting: Neurosurgery

## 2015-10-30 DIAGNOSIS — M542 Cervicalgia: Secondary | ICD-10-CM

## 2015-12-17 ENCOUNTER — Encounter: Payer: TRICARE For Life (TFL) | Admitting: Neurology

## 2016-01-14 ENCOUNTER — Encounter: Payer: Self-pay | Admitting: Neurology

## 2016-01-14 ENCOUNTER — Ambulatory Visit (INDEPENDENT_AMBULATORY_CARE_PROVIDER_SITE_OTHER): Payer: Self-pay | Admitting: Neurology

## 2016-01-14 ENCOUNTER — Ambulatory Visit (INDEPENDENT_AMBULATORY_CARE_PROVIDER_SITE_OTHER): Payer: Medicare Other | Admitting: Neurology

## 2016-01-14 DIAGNOSIS — R202 Paresthesia of skin: Secondary | ICD-10-CM | POA: Diagnosis not present

## 2016-01-14 DIAGNOSIS — M79609 Pain in unspecified limb: Secondary | ICD-10-CM

## 2016-01-14 DIAGNOSIS — R55 Syncope and collapse: Secondary | ICD-10-CM

## 2016-01-14 NOTE — Progress Notes (Signed)
Please refer to EMG and nerve conduction study procedure note. 

## 2016-01-14 NOTE — Procedures (Signed)
     HISTORY:  Kurt Williams is a 65 year old gentleman with a two-month history of intermittent paresthesias and numbness and discomfort down the right arm from the shoulder to the hand. He indicates that the episodes are transient and brief, lasting about 15 minutes. He has not had an episode in greater than one week. He does report some neck discomfort. He is being evaluated for a possible neuropathy or a cervical radiculopathy.  NERVE CONDUCTION STUDIES:  Nerve conduction studies were performed on both upper extremities. The distal motor latencies and motor amplitudes for the median and ulnar nerves were within normal limits. The F wave latencies and nerve conduction velocities for these nerves were also normal. The sensory latencies for the median and ulnar nerves were normal.   EMG STUDIES:  EMG study was performed on the right upper extremity:  The first dorsal interosseous muscle reveals 2 to 4 K units with full recruitment. No fibrillations or positive waves were noted. The abductor pollicis brevis muscle reveals 2 to 4 K units with full recruitment. No fibrillations or positive waves were noted. The extensor indicis proprius muscle reveals 1 to 3 K units with full recruitment. No fibrillations or positive waves were noted. The pronator teres muscle reveals 2 to 3 K units with full recruitment. No fibrillations or positive waves were noted. The biceps muscle reveals 1 to 2 K units with full recruitment. No fibrillations or positive waves were noted. The triceps muscle reveals 2 to 4 K units with full recruitment. No fibrillations or positive waves were noted. The anterior deltoid muscle reveals 2 to 3 K units with full recruitment. No fibrillations or positive waves were noted. The cervical paraspinal muscles were tested at 2 levels. No abnormalities of insertional activity were seen at the lower level tested. 1+ positive waves were seen at the upper level. There was good  relaxation.   IMPRESSION:  Nerve conduction studies done on both upper extremities were within normal limits. No evidence of a neuropathy is seen. EMG evaluation of the right upper extremity is relatively unremarkable, with exception that there is isolated mild denervation of the upper cervical paraspinal muscles. This is of unclear clinical significance, but could be a manifestation of a low-grade cervical radiculopathy of indeterminate level. Clinical correlation is required.  Jill Alexanders MD 01/14/2016 4:20 PM  [facility] [address] [place], [place] [address]  Phone [phone] Fax [phone]

## 2016-03-08 DIAGNOSIS — Z0000 Encounter for general adult medical examination without abnormal findings: Secondary | ICD-10-CM | POA: Diagnosis not present

## 2016-03-29 DIAGNOSIS — H6983 Other specified disorders of Eustachian tube, bilateral: Secondary | ICD-10-CM | POA: Diagnosis not present

## 2016-03-29 DIAGNOSIS — H903 Sensorineural hearing loss, bilateral: Secondary | ICD-10-CM | POA: Insufficient documentation

## 2016-07-21 DIAGNOSIS — R7309 Other abnormal glucose: Secondary | ICD-10-CM | POA: Diagnosis not present

## 2016-07-21 DIAGNOSIS — D509 Iron deficiency anemia, unspecified: Secondary | ICD-10-CM | POA: Diagnosis not present

## 2016-07-21 DIAGNOSIS — I10 Essential (primary) hypertension: Secondary | ICD-10-CM | POA: Diagnosis not present

## 2016-10-05 DIAGNOSIS — J309 Allergic rhinitis, unspecified: Secondary | ICD-10-CM | POA: Diagnosis not present

## 2016-10-05 DIAGNOSIS — Z0000 Encounter for general adult medical examination without abnormal findings: Secondary | ICD-10-CM | POA: Diagnosis not present

## 2016-10-05 DIAGNOSIS — Z8679 Personal history of other diseases of the circulatory system: Secondary | ICD-10-CM | POA: Diagnosis not present

## 2016-10-05 DIAGNOSIS — I10 Essential (primary) hypertension: Secondary | ICD-10-CM | POA: Diagnosis not present

## 2017-07-14 DIAGNOSIS — R7303 Prediabetes: Secondary | ICD-10-CM | POA: Diagnosis not present

## 2017-07-14 DIAGNOSIS — I10 Essential (primary) hypertension: Secondary | ICD-10-CM | POA: Diagnosis not present

## 2017-07-14 DIAGNOSIS — J309 Allergic rhinitis, unspecified: Secondary | ICD-10-CM | POA: Diagnosis not present

## 2017-07-14 DIAGNOSIS — M25511 Pain in right shoulder: Secondary | ICD-10-CM | POA: Diagnosis not present

## 2017-07-22 ENCOUNTER — Ambulatory Visit
Admission: RE | Admit: 2017-07-22 | Discharge: 2017-07-22 | Disposition: A | Discharge status: Home / Self Care | Payer: Medicare Other | Source: Ambulatory Visit | Attending: Internal Medicine | Admitting: Internal Medicine

## 2017-07-22 ENCOUNTER — Other Ambulatory Visit: Payer: Self-pay | Admitting: Internal Medicine

## 2017-07-22 ENCOUNTER — Encounter: Payer: Self-pay | Admitting: Family Medicine

## 2017-07-22 DIAGNOSIS — Z0000 Encounter for general adult medical examination without abnormal findings: Secondary | ICD-10-CM | POA: Diagnosis not present

## 2017-07-22 DIAGNOSIS — M19011 Primary osteoarthritis, right shoulder: Secondary | ICD-10-CM | POA: Diagnosis not present

## 2017-07-22 DIAGNOSIS — M25511 Pain in right shoulder: Secondary | ICD-10-CM

## 2017-07-26 DIAGNOSIS — B351 Tinea unguium: Secondary | ICD-10-CM | POA: Insufficient documentation

## 2017-08-16 DIAGNOSIS — R7303 Prediabetes: Secondary | ICD-10-CM | POA: Diagnosis not present

## 2017-08-16 DIAGNOSIS — I10 Essential (primary) hypertension: Secondary | ICD-10-CM | POA: Diagnosis not present

## 2017-08-16 DIAGNOSIS — N182 Chronic kidney disease, stage 2 (mild): Secondary | ICD-10-CM | POA: Diagnosis not present

## 2017-08-16 DIAGNOSIS — M19011 Primary osteoarthritis, right shoulder: Secondary | ICD-10-CM | POA: Diagnosis not present

## 2017-11-22 DIAGNOSIS — J45909 Unspecified asthma, uncomplicated: Secondary | ICD-10-CM | POA: Diagnosis not present

## 2017-11-22 DIAGNOSIS — J309 Allergic rhinitis, unspecified: Secondary | ICD-10-CM | POA: Diagnosis not present

## 2017-11-22 DIAGNOSIS — R918 Other nonspecific abnormal finding of lung field: Secondary | ICD-10-CM | POA: Diagnosis not present

## 2017-11-22 DIAGNOSIS — H6691 Otitis media, unspecified, right ear: Secondary | ICD-10-CM | POA: Diagnosis not present

## 2017-11-22 DIAGNOSIS — R05 Cough: Secondary | ICD-10-CM | POA: Diagnosis not present

## 2017-11-24 DIAGNOSIS — J069 Acute upper respiratory infection, unspecified: Secondary | ICD-10-CM | POA: Diagnosis not present

## 2017-11-24 DIAGNOSIS — J40 Bronchitis, not specified as acute or chronic: Secondary | ICD-10-CM | POA: Diagnosis not present

## 2017-11-24 DIAGNOSIS — J189 Pneumonia, unspecified organism: Secondary | ICD-10-CM | POA: Diagnosis not present

## 2017-11-24 DIAGNOSIS — R05 Cough: Secondary | ICD-10-CM | POA: Diagnosis not present

## 2017-11-24 DIAGNOSIS — R0602 Shortness of breath: Secondary | ICD-10-CM | POA: Diagnosis not present

## 2017-12-23 DIAGNOSIS — Z125 Encounter for screening for malignant neoplasm of prostate: Secondary | ICD-10-CM | POA: Diagnosis not present

## 2017-12-23 DIAGNOSIS — Z5181 Encounter for therapeutic drug level monitoring: Secondary | ICD-10-CM | POA: Diagnosis not present

## 2017-12-23 DIAGNOSIS — Z131 Encounter for screening for diabetes mellitus: Secondary | ICD-10-CM | POA: Diagnosis not present

## 2017-12-23 DIAGNOSIS — Z01118 Encounter for examination of ears and hearing with other abnormal findings: Secondary | ICD-10-CM | POA: Diagnosis not present

## 2017-12-23 DIAGNOSIS — E559 Vitamin D deficiency, unspecified: Secondary | ICD-10-CM | POA: Diagnosis not present

## 2017-12-23 DIAGNOSIS — Z136 Encounter for screening for cardiovascular disorders: Secondary | ICD-10-CM | POA: Diagnosis not present

## 2017-12-23 DIAGNOSIS — Z0000 Encounter for general adult medical examination without abnormal findings: Secondary | ICD-10-CM | POA: Diagnosis not present

## 2017-12-23 DIAGNOSIS — H9209 Otalgia, unspecified ear: Secondary | ICD-10-CM | POA: Diagnosis not present

## 2017-12-23 DIAGNOSIS — I2510 Atherosclerotic heart disease of native coronary artery without angina pectoris: Secondary | ICD-10-CM | POA: Diagnosis not present

## 2017-12-23 DIAGNOSIS — Z113 Encounter for screening for infections with a predominantly sexual mode of transmission: Secondary | ICD-10-CM | POA: Diagnosis not present

## 2017-12-28 DIAGNOSIS — G4710 Hypersomnia, unspecified: Secondary | ICD-10-CM | POA: Diagnosis not present

## 2018-01-04 DIAGNOSIS — G4733 Obstructive sleep apnea (adult) (pediatric): Secondary | ICD-10-CM | POA: Diagnosis not present

## 2018-01-19 DIAGNOSIS — G4733 Obstructive sleep apnea (adult) (pediatric): Secondary | ICD-10-CM | POA: Diagnosis not present

## 2018-01-19 DIAGNOSIS — Z6834 Body mass index (BMI) 34.0-34.9, adult: Secondary | ICD-10-CM | POA: Diagnosis not present

## 2018-01-19 DIAGNOSIS — G4719 Other hypersomnia: Secondary | ICD-10-CM | POA: Diagnosis not present

## 2018-01-19 DIAGNOSIS — E6609 Other obesity due to excess calories: Secondary | ICD-10-CM | POA: Diagnosis not present

## 2018-01-20 DIAGNOSIS — E785 Hyperlipidemia, unspecified: Secondary | ICD-10-CM | POA: Diagnosis not present

## 2018-01-20 DIAGNOSIS — N289 Disorder of kidney and ureter, unspecified: Secondary | ICD-10-CM | POA: Diagnosis not present

## 2018-01-20 DIAGNOSIS — I2510 Atherosclerotic heart disease of native coronary artery without angina pectoris: Secondary | ICD-10-CM | POA: Diagnosis not present

## 2018-01-20 DIAGNOSIS — R7303 Prediabetes: Secondary | ICD-10-CM | POA: Diagnosis not present

## 2018-01-20 DIAGNOSIS — G4733 Obstructive sleep apnea (adult) (pediatric): Secondary | ICD-10-CM | POA: Diagnosis not present

## 2018-01-20 DIAGNOSIS — E559 Vitamin D deficiency, unspecified: Secondary | ICD-10-CM | POA: Diagnosis not present

## 2018-02-03 DIAGNOSIS — I2510 Atherosclerotic heart disease of native coronary artery without angina pectoris: Secondary | ICD-10-CM | POA: Diagnosis not present

## 2018-02-03 DIAGNOSIS — R9431 Abnormal electrocardiogram [ECG] [EKG]: Secondary | ICD-10-CM | POA: Diagnosis not present

## 2018-03-16 DIAGNOSIS — G4733 Obstructive sleep apnea (adult) (pediatric): Secondary | ICD-10-CM | POA: Diagnosis not present

## 2018-03-16 DIAGNOSIS — Z6835 Body mass index (BMI) 35.0-35.9, adult: Secondary | ICD-10-CM | POA: Diagnosis not present

## 2018-03-16 DIAGNOSIS — G4719 Other hypersomnia: Secondary | ICD-10-CM | POA: Diagnosis not present

## 2018-03-16 DIAGNOSIS — E6609 Other obesity due to excess calories: Secondary | ICD-10-CM | POA: Diagnosis not present

## 2018-03-17 DIAGNOSIS — I2510 Atherosclerotic heart disease of native coronary artery without angina pectoris: Secondary | ICD-10-CM | POA: Diagnosis not present

## 2018-03-17 DIAGNOSIS — J329 Chronic sinusitis, unspecified: Secondary | ICD-10-CM | POA: Diagnosis not present

## 2018-03-17 DIAGNOSIS — N289 Disorder of kidney and ureter, unspecified: Secondary | ICD-10-CM | POA: Diagnosis not present

## 2018-03-17 DIAGNOSIS — R7303 Prediabetes: Secondary | ICD-10-CM | POA: Diagnosis not present

## 2018-03-17 DIAGNOSIS — D649 Anemia, unspecified: Secondary | ICD-10-CM | POA: Diagnosis not present

## 2018-03-17 DIAGNOSIS — G4733 Obstructive sleep apnea (adult) (pediatric): Secondary | ICD-10-CM | POA: Diagnosis not present

## 2018-03-17 DIAGNOSIS — E785 Hyperlipidemia, unspecified: Secondary | ICD-10-CM | POA: Diagnosis not present

## 2018-03-17 DIAGNOSIS — E559 Vitamin D deficiency, unspecified: Secondary | ICD-10-CM | POA: Diagnosis not present

## 2018-03-31 DIAGNOSIS — E559 Vitamin D deficiency, unspecified: Secondary | ICD-10-CM | POA: Diagnosis not present

## 2018-03-31 DIAGNOSIS — E785 Hyperlipidemia, unspecified: Secondary | ICD-10-CM | POA: Diagnosis not present

## 2018-03-31 DIAGNOSIS — I2510 Atherosclerotic heart disease of native coronary artery without angina pectoris: Secondary | ICD-10-CM | POA: Diagnosis not present

## 2018-03-31 DIAGNOSIS — G4733 Obstructive sleep apnea (adult) (pediatric): Secondary | ICD-10-CM | POA: Diagnosis not present

## 2018-03-31 DIAGNOSIS — N183 Chronic kidney disease, stage 3 (moderate): Secondary | ICD-10-CM | POA: Diagnosis not present

## 2018-03-31 DIAGNOSIS — R7303 Prediabetes: Secondary | ICD-10-CM | POA: Diagnosis not present

## 2018-04-25 DIAGNOSIS — G4733 Obstructive sleep apnea (adult) (pediatric): Secondary | ICD-10-CM | POA: Diagnosis not present

## 2018-04-25 DIAGNOSIS — N183 Chronic kidney disease, stage 3 (moderate): Secondary | ICD-10-CM | POA: Diagnosis not present

## 2018-04-25 DIAGNOSIS — I2510 Atherosclerotic heart disease of native coronary artery without angina pectoris: Secondary | ICD-10-CM | POA: Diagnosis not present

## 2018-04-25 DIAGNOSIS — R7303 Prediabetes: Secondary | ICD-10-CM | POA: Diagnosis not present

## 2018-04-25 DIAGNOSIS — E785 Hyperlipidemia, unspecified: Secondary | ICD-10-CM | POA: Diagnosis not present

## 2018-04-25 DIAGNOSIS — E559 Vitamin D deficiency, unspecified: Secondary | ICD-10-CM | POA: Diagnosis not present

## 2018-04-25 DIAGNOSIS — R300 Dysuria: Secondary | ICD-10-CM | POA: Diagnosis not present

## 2018-05-02 DIAGNOSIS — Z125 Encounter for screening for malignant neoplasm of prostate: Secondary | ICD-10-CM | POA: Diagnosis not present

## 2018-05-02 DIAGNOSIS — R300 Dysuria: Secondary | ICD-10-CM | POA: Diagnosis not present

## 2018-05-12 DIAGNOSIS — Z125 Encounter for screening for malignant neoplasm of prostate: Secondary | ICD-10-CM | POA: Diagnosis not present

## 2018-05-12 DIAGNOSIS — R300 Dysuria: Secondary | ICD-10-CM | POA: Diagnosis not present

## 2018-06-07 DIAGNOSIS — J069 Acute upper respiratory infection, unspecified: Secondary | ICD-10-CM | POA: Diagnosis not present

## 2018-06-14 ENCOUNTER — Ambulatory Visit (INDEPENDENT_AMBULATORY_CARE_PROVIDER_SITE_OTHER): Payer: Medicare Other | Admitting: Family Medicine

## 2018-06-14 ENCOUNTER — Encounter: Payer: Self-pay | Admitting: Family Medicine

## 2018-06-14 VITALS — BP 118/70 | HR 50 | Temp 98.4°F | Ht 72.0 in | Wt 250.2 lb

## 2018-06-14 DIAGNOSIS — G4733 Obstructive sleep apnea (adult) (pediatric): Secondary | ICD-10-CM | POA: Insufficient documentation

## 2018-06-14 DIAGNOSIS — Z9989 Dependence on other enabling machines and devices: Secondary | ICD-10-CM | POA: Diagnosis not present

## 2018-06-14 DIAGNOSIS — I2510 Atherosclerotic heart disease of native coronary artery without angina pectoris: Secondary | ICD-10-CM | POA: Diagnosis not present

## 2018-06-14 DIAGNOSIS — J309 Allergic rhinitis, unspecified: Secondary | ICD-10-CM

## 2018-06-14 NOTE — Progress Notes (Signed)
Chief Complaint  Patient presents with  . New Patient (Initial Visit)       New Patient Visit SUBJECTIVE: HPI: Kurt Williams is an 67 y.o.male who is being seen for establishing care.  The patient was previously seen at different office.  Duration: 10 days  Associated symptoms: sinus congestion, rhinorrhea and sinus pressure Denies: sinus pain, itchy watery eyes, ear pain, ear drainage, sore throat, wheezing, shortness of breath, myalgia and fevers Treatment to date: Claritin- has been helpful Sick contacts: No  Hx of CAD also. Does not take a statin. Stent placed almost 20 years ago.   No Known Allergies  Past Medical History:  Diagnosis Date  . Coronary artery disease   . GERD (gastroesophageal reflux disease)   . Headache(784.0)   . Migraine   . OSA on CPAP    Past Surgical History:  Procedure Laterality Date  . CORONARY ANGIOPLASTY WITH STENT PLACEMENT  2000  . KNEE ARTHROSCOPY  1963   on Right Knee with removal of pieces of shattered bone  . KNEE ARTHROSCOPY W/ ACL RECONSTRUCTION  1973   Left knee  . OTHER SURGICAL HISTORY  2011   to lower organs on left side that were pressing on diaphragm   Family History  Problem Relation Age of Onset  . Hypertension Mother   . Cancer Mother        multiple meyloma  . Cancer Father        stomach cancer  . Diabetes Father    No Known Allergies  Current Outpatient Medications:  .  aspirin 81 MG chewable tablet, Chew 81 mg by mouth daily., Disp: , Rfl:  .  ASTAXANTHIN PO, Take 12 mg by mouth daily., Disp: , Rfl:  .  [facility] Liver Oil CAPS, Take 1 capsule by mouth daily., Disp: , Rfl:  .  NITROSTAT 0.4 MG SL tablet, , Disp: , Rfl:  .  OVER THE COUNTER MEDICATION, Take 1 tablet by mouth daily as needed. Allergy medication OTC, Disp: , Rfl:   ROS Cardiovascular: Denies chest pain  Respiratory: Denies dyspnea   OBJECTIVE: BP 118/70 (BP Location: Left Arm, Patient Position: Sitting, Cuff Size: Large)   Pulse (!) 50   Temp  98.4 F (36.9 C) (Oral)   Ht 6' (1.829 m)   Wt 250 lb 4 oz (113.5 kg)   SpO2 96%   BMI 33.94 kg/m   Constitutional: -  VS reviewed -  Well developed, well nourished, appears stated age -  No apparent distress  Psychiatric: -  Oriented to person, place, and time -  Memory intact -  Affect and mood normal -  Fluent conversation, good eye contact -  Judgment and insight age appropriate  Eye: -  Conjunctivae clear, no discharge -  Pupils symmetric, round, reactive to light  ENMT: -  MMM    Pharynx moist, no exudate, no erythema -  Ears neg b/l -  Nares patent, no edema or d/c -  Sinuses not ttp  Neck: -  No gross swelling, no palpable masses -  Thyroid midline, not enlarged, mobile, no palpable masses  Cardiovascular: -  Bradycardic, reg rhythm -  No LE edema  Respiratory: -  Normal respiratory effort, no accessory muscle use, no retraction -  Breath sounds equal, no wheezes, no ronchi, no crackles  Musculoskeletal: -  No clubbing, no cyanosis -  Gait normal  Skin: -  No significant lesion on inspection -  Warm and dry to palpation  ASSESSMENT/PLAN: Coronary artery disease involving native heart, angina presence unspecified, unspecified vessel or lesion type  OSA on CPAP  Allergic rhinitis, unspecified seasonality, unspecified trigger  Patient instructed to sign release of records form from his previous PCP. I would like to see records as he questions his diagnosis of CAD.  If he does have CAD, I will recommend a statin. Can use CPAP. Claritin, discussed other over-the-counter options.  He does appear to be getting better though. Patient should return in 6 mo or prn. AWV afterwards. The patient voiced understanding and agreement to the plan.   [place], DO 06/14/18  4:56 PM

## 2018-06-14 NOTE — Patient Instructions (Signed)
Claritin (loratadine), Allegra (fexofenadine), Zyrtec (cetirizine); these are listed in order from weakest to strongest. Generic, and therefore cheaper, options are in the parentheses.   Flonase (fluticasone); nasal spray that is over the counter. 2 sprays each nostril, once daily. Aim towards the same side eye when you spray.  There are available OTC, and the generic versions, which may be cheaper, are in parentheses. Show this to a pharmacist if you have trouble finding any of these items.  Give me time to review your records. I will likely recommend a statin drug to lower your risk of heart attack and/or stroke.  Let us know if you need anything.

## 2018-08-07 ENCOUNTER — Telehealth: Payer: Self-pay

## 2018-08-07 NOTE — Telephone Encounter (Signed)
08/07/18. Informed patient via answering machine that the record we have are from Dr. Jearl Klinefelter -[place],    Copied from [place]. Topic: Inquiry >> Aug 07, 2018  1:06 PM Kurt Williams, NT wrote: Reason for CRM: patient is calling and would like to confirm that his records have been transferred from his previous doctors.

## 2018-08-09 ENCOUNTER — Encounter: Payer: Self-pay | Admitting: Family Medicine

## 2018-08-09 ENCOUNTER — Ambulatory Visit (INDEPENDENT_AMBULATORY_CARE_PROVIDER_SITE_OTHER): Payer: Medicare Other | Admitting: Family Medicine

## 2018-08-09 VITALS — BP 118/70 | HR 52 | Temp 98.1°F | Ht 72.0 in | Wt 252.1 lb

## 2018-08-09 DIAGNOSIS — L989 Disorder of the skin and subcutaneous tissue, unspecified: Secondary | ICD-10-CM | POA: Diagnosis not present

## 2018-08-09 DIAGNOSIS — N529 Male erectile dysfunction, unspecified: Secondary | ICD-10-CM | POA: Diagnosis not present

## 2018-08-09 MED ORDER — NITROGLYCERIN 0.4 MG SL SUBL: 0.4000 mg | SUBLINGUAL_TABLET | SUBLINGUAL | 0 refills | Status: AC | PRN

## 2018-08-09 MED ORDER — SILDENAFIL CITRATE 20 MG PO TABS: ORAL_TABLET | ORAL | 1 refills | Status: AC

## 2018-08-09 NOTE — Addendum Note (Signed)
Addended by: Ames Coupe on: 08/09/2018 10:14 AM   Modules accepted: Level of Service

## 2018-08-09 NOTE — Progress Notes (Signed)
Pre visit review using our clinic review tool, if applicable. No additional management support is needed unless otherwise documented below in the visit note. 

## 2018-08-09 NOTE — Patient Instructions (Addendum)
Let's keep an eye on things for now. This does not look like anything sinister.   Don't take the sildenafil and nitro at the same time.  If there are any changes, let us know.

## 2018-08-09 NOTE — Progress Notes (Signed)
Chief Complaint  Patient presents with  . Mass    shoulder    Kurt Williams is a 67 y.o. male here for a skin complaint.  Duration: 30 years Location: shoulders Pruritic? No Painful? No Drainage? No Sick contacts? No Other associated symptoms: None Therapies tried thus far: None  Hx of ED. Was given sildenafil in the past. Hx of being on SL nitro, has not used in >5 years.   ROS:  Const: No fevers Skin: As noted in HPI  Past Medical History:  Diagnosis Date  . Coronary artery disease   . GERD (gastroesophageal reflux disease)   . Headache(784.0)   . Migraine   . OSA on CPAP    No Known Allergies   BP 118/70 (BP Location: Left Arm, Patient Position: Sitting, Cuff Size: Large)   Pulse (!) 52   Temp 98.1 F (36.7 C) (Oral)   Ht 6' (1.829 m)   Wt 252 lb 2 oz (114.4 kg)   SpO2 96%   BMI 34.19 kg/m  Gen: awake, alert, appearing stated age Lungs: No accessory muscle use Skin: Just cephelad to the R medial clavicle, there is an enlarged lesion that is raised and feels fleshy. It is freely moveable. No drainage, erythema, TTP, fluctuance, excoriation Psych: Age appropriate judgment and insight  Skin lesion  Erectile dysfunction, unspecified erectile dysfunction type - Plan: sildenafil (REVATIO) 20 MG tablet  #1- watchful waiting. Offered [place], but since it has been going on for 30 yrs, unlikely to be anything sinister. He will let us know if anything changes. #2- refill med. Encouraged him to avoid taking this if taking Nitro. He has not taken nitro in >5 years though and specifically denies CP or sob. F/u prn. The patient voiced understanding and agreement to the plan.  [place], DO 08/09/18 10:12 AM

## 2018-09-04 ENCOUNTER — Ambulatory Visit (INDEPENDENT_AMBULATORY_CARE_PROVIDER_SITE_OTHER): Payer: Medicare Other | Admitting: Medical

## 2018-09-04 ENCOUNTER — Encounter: Payer: Self-pay | Admitting: Medical

## 2018-09-04 VITALS — BP 112/71 | HR 62 | Temp 98.4°F | Ht 72.0 in | Wt 249.6 lb

## 2018-09-04 DIAGNOSIS — R42 Dizziness and giddiness: Secondary | ICD-10-CM

## 2018-09-04 DIAGNOSIS — I2510 Atherosclerotic heart disease of native coronary artery without angina pectoris: Secondary | ICD-10-CM | POA: Diagnosis not present

## 2018-09-04 DIAGNOSIS — Z8679 Personal history of other diseases of the circulatory system: Secondary | ICD-10-CM | POA: Diagnosis not present

## 2018-09-04 LAB — CBC WITH DIFFERENTIAL/PLATELET
BASOS PCT: 0.6 % (ref 0.0–3.0)
Basophils Absolute: 0 10*3/uL (ref 0.0–0.1)
EOS ABS: 0.1 10*3/uL (ref 0.0–0.7)
Eosinophils Relative: 0.9 % (ref 0.0–5.0)
HEMATOCRIT: 39.4 % (ref 39.0–52.0)
Hemoglobin: 13.1 g/dL (ref 13.0–17.0)
LYMPHS PCT: 27.3 % (ref 12.0–46.0)
Lymphs Abs: 1.6 10*3/uL (ref 0.7–4.0)
MCHC: 33.1 g/dL (ref 30.0–36.0)
MCV: 92 fl (ref 78.0–100.0)
MONOS PCT: 13 % — AB (ref 3.0–12.0)
Monocytes Absolute: 0.8 10*3/uL (ref 0.1–1.0)
NEUTROS ABS: 3.4 10*3/uL (ref 1.4–7.7)
Neutrophils Relative %: 58.2 % (ref 43.0–77.0)
PLATELETS: 193 10*3/uL (ref 150.0–400.0)
RBC: 4.29 Mil/uL (ref 4.22–5.81)
RDW: 14 % (ref 11.5–15.5)
WBC: 5.8 10*3/uL (ref 4.0–10.5)

## 2018-09-04 LAB — COMPREHENSIVE METABOLIC PANEL
ALT: 14 U/L (ref 0–53)
AST: 21 U/L (ref 0–37)
Albumin: 4.2 g/dL (ref 3.5–5.2)
Alkaline Phosphatase: 59 U/L (ref 39–117)
BILIRUBIN TOTAL: 0.6 mg/dL (ref 0.2–1.2)
BUN: 14 mg/dL (ref 6–23)
CHLORIDE: 104 meq/L (ref 96–112)
CO2: 30 meq/L (ref 19–32)
Calcium: 9.4 mg/dL (ref 8.4–10.5)
Creatinine, Ser: 1.28 mg/dL (ref 0.40–1.50)
GFR: 71.57 mL/min (ref >60.00)
GLUCOSE: 75 mg/dL (ref 70–99)
Potassium: 4.2 mEq/L (ref 3.5–5.1)
Sodium: 138 mEq/L (ref 135–145)
Total Protein: 7.1 g/dL (ref 6.0–8.3)

## 2018-09-04 MED ORDER — MECLIZINE HCL 12.5 MG PO TABS: 12.5000 mg | ORAL_TABLET | Freq: Three times a day (TID) | ORAL | 0 refills | Status: DC | PRN

## 2018-09-04 NOTE — Patient Instructions (Addendum)
You had had some recent very minimal lightheaded sensation.  You had very good neurologic exam with absence of gross motor or sensory function deficits.  We will get a CBC and metabolic panel today.  We will make sure that you do not have any anemia or any electrolyte abnormality that might be contributing factor to your slight lightheadedness.  I did go ahead and make meclizine available today.  You could try this over the next 24 hours and then just use it on an as-needed basis for constant dizziness.  Based on examination today decided not to do CT of the head without contrast.  But will follow you closely and see how you do.  If you get any gross motor or sensory function deficit type symptoms as we discussed/I explained to recommend ED evaluation for imaging studies.  You have a history of coronary artery disease and stent.  No recent EKG except for 2014 in our chart so wanted to get new baseline EKG.  EKG shows sinus bradycardia.  No acute findings.  Very similar to EKG done in 2014.  Rates a little slow today but you had been resting and initially your pulse was 62 when we checked your vitals.  I want you to be cautious when you change positions going from lying to sitting or sitting to standing.  Make sure that you gain your balance before ambulating.  Follow-up in 7 days or as needed.  We will update you on lab results.  Also see how you are doing.  Might decide to later get CT of the head without contrast.  I do not think that is indicated presently.

## 2018-09-04 NOTE — Progress Notes (Signed)
Pre visit review using our clinic tool,if applicable. No additional management support is needed unless otherwise documented below in the visit note.  

## 2018-09-04 NOTE — Progress Notes (Signed)
Subjective:    Patient ID: Kurt Williams, male    DOB: 1951/09/15, 67 y.o.   MRN: [id number]  HPI  Pt in for some recent light headed sensation since around noon. He states constant mild light headed sensation. Pt denies that when he turns his head he has any spinning sensation. He states he feel like he is on verge of dizziness but not quite there.  Pt states on Saturday 103/53. Then 30 minutes later bp was 120/71. No gross motor or sensory function deficits.  Denies chest pain or palpitations.  Pt states since Wednesday he had larger normal movement. He states normally has 2 bm's a day.Since Thursday one bowel movement a day.  No abdomen pain.  No frequent urination. He admits not drinking enough water. At most 32 oz of water recently.     Pt update he has been using cpap machine. He states he feels well rested when he does use.   Review of Systems  Constitutional: Negative for chills, fatigue and fever.  Respiratory: Negative for apnea, choking and wheezing.   Cardiovascular: Negative for chest pain and palpitations.  Gastrointestinal: Negative for abdominal pain.  Musculoskeletal: Negative for back pain.  Neurological: Positive for light-headedness. Negative for dizziness, speech difficulty and weakness.  Hematological: Negative for adenopathy. Does not bruise/bleed easily.  Psychiatric/Behavioral: Negative for behavioral problems and confusion.    Past Medical History:  Diagnosis Date  . Coronary artery disease   . GERD (gastroesophageal reflux disease)   . Headache(784.0)   . Migraine   . OSA on CPAP      Social History   Socioeconomic History  . Marital status: Married    Spouse name: Not on file  . Number of children: Not on file  . Years of education: Not on file  . Highest education level: Not on file  Occupational History  . Not on file  Social Needs  . Financial resource strain: Not on file  . Food insecurity:    Worry: Not on file    Inability: Not  on file  . Transportation needs:    Medical: Not on file    Non-medical: Not on file  Tobacco Use  . Smoking status: Never Smoker  Substance and Sexual Activity  . Alcohol use: Yes    Comment: occasionaly  . Drug use: No  . Sexual activity: Yes  Lifestyle  . Physical activity:    Days per week: Not on file    Minutes per session: Not on file  . Stress: Not on file  Relationships  . Social connections:    Talks on phone: Not on file    Gets together: Not on file    Attends religious service: Not on file    Active member of club or organization: Not on file    Attends meetings of clubs or organizations: Not on file    Relationship status: Not on file  . Intimate partner violence:    Fear of current or ex partner: Not on file    Emotionally abused: Not on file    Physically abused: Not on file    Forced sexual activity: Not on file  Other Topics Concern  . Not on file  Social History Narrative  . Not on file    Past Surgical History:  Procedure Laterality Date  . CORONARY ANGIOPLASTY WITH STENT PLACEMENT  2000  . KNEE ARTHROSCOPY  1963   on Right Knee with removal of pieces of shattered bone  .  KNEE ARTHROSCOPY W/ ACL RECONSTRUCTION  1973   Left knee  . OTHER SURGICAL HISTORY  2011   to lower organs on left side that were pressing on diaphragm    Family History  Problem Relation Age of Onset  . Hypertension Mother   . Cancer Mother        multiple meyloma  . Cancer Father        stomach cancer  . Diabetes Father     No Known Allergies  Current Outpatient Medications on File Prior to Visit  Medication Sig Dispense Refill  . ASTAXANTHIN PO Take 12 mg by mouth daily.    Marland Kitchen Cod Liver Oil CAPS Take 1 capsule by mouth daily.    . nitroGLYCERIN (NITROSTAT) 0.4 MG SL tablet Place 1 tablet (0.4 mg total) under the tongue every 5 (five) minutes as needed for chest pain. 30 tablet 0  . OVER THE COUNTER MEDICATION Take 1 tablet by mouth daily as needed. Allergy  medication OTC    . sildenafil (REVATIO) 20 MG tablet Take 2-3 tabs as needed for erectile dysfunction. 30 tablet 1  . aspirin 81 MG chewable tablet Chew 81 mg by mouth daily.     No current facility-administered medications on file prior to visit.     BP 112/71   Pulse 62   Temp 98.4 F (36.9 C)   Ht 6' (1.829 m)   Wt 249 lb 9.6 oz (113.2 kg)   SpO2 99%   BMI 33.85 kg/m       Objective:   Physical Exam  General Mental Status- Alert. General Appearance- Not in acute distress.   Skin General: Color- Normal Color. Moisture- Normal Moisture.  Neck Carotid Arteries- Normal color. Moisture- Normal Moisture. No carotid bruits. No JVD.  Chest and Lung Exam Auscultation: Breath Sounds:-Normal.  Cardiovascular Auscultation:Rythm- Regular. Murmurs & Other Heart Sounds:Auscultation of the heart reveals- No Murmurs.  Abdomen Inspection:-Inspeection Normal. Palpation/Percussion:Note:No mass. Palpation and Percussion of the abdomen reveal- Non Tender, Non Distended + BS, no rebound or guarding.    Neurologic Cranial Nerve exam:- CN III-XII intact(No nystagmus), symmetric smile. Drift Test:- No drift. Romberg Exam:- Negative.  Heal to Toe Gait exam:-very good overall. Slight difficulty mid testing.  Finger to Nose:- Normal/Intact Strength:- 5/5 equal and symmetric strength both upper and lower extremities. Changing position supine to sitting one second or so feeling slight more light headed then states feels better.       Assessment & Plan:  You had had some recent very minimal lightheaded sensation.  You had very good neurologic exam with absence of gross motor or sensory function deficits.  We will get a CBC and metabolic panel today.  We will make sure that you do not have any anemia or any electrolyte abnormality that might be contributing factor to your slight lightheadedness.  I did go ahead and make meclizine available today.  You could try this over the next 24 hours  and then just use it on an as-needed basis for constant dizziness.  Based on examination today decided not to do CT of the head without contrast.  But will follow you closely and see how you do.  If you get any gross motor or sensory function deficit type symptoms as we discussed/I explained to recommend ED evaluation for imaging studies.  You have a history of coronary artery disease and stent.  No recent EKG except for 2014 in our chart so wanted to get new baseline EKG.  EKG shows  sinus bradycardia.  No acute findings.  Very similar to EKG done in 2014.  Rates a little slow today but you had been resting and initially your pulse was 62 when we checked your vitals.  I want you to be cautious when you change positions going from lying to sitting or sitting to standing.  Make sure that you gain your balance before ambulating.  Follow-up in 7 days or as needed.  We will update you on lab results.  Also see how you are doing.  Might decide to later get CT of the head without contrast.  I do not think that is indicated presently.  40 minute spent with pt 50% of time spent counseling with patient today. Extra time spent based on light headed complaint as well as known coronary artery disease.   Mackie Pai, PA-C

## 2018-09-19 ENCOUNTER — Telehealth: Payer: Self-pay | Admitting: Family Medicine

## 2018-09-19 NOTE — Telephone Encounter (Signed)
Copied from [place] [id number]. Topic: General - Other >> Sep 19, 2018  3:33 PM Margot Ables wrote: Reason for CRM: requesting copy of most recent OV  - fax to [phone] Attn: Freda Munro

## 2018-09-20 NOTE — Telephone Encounter (Signed)
Faxed most recent office notes as instructed

## 2018-11-17 ENCOUNTER — Ambulatory Visit (INDEPENDENT_AMBULATORY_CARE_PROVIDER_SITE_OTHER): Payer: Medicare Other | Admitting: Family Medicine

## 2018-11-17 ENCOUNTER — Encounter: Payer: Self-pay | Admitting: Family Medicine

## 2018-11-17 DIAGNOSIS — J069 Acute upper respiratory infection, unspecified: Secondary | ICD-10-CM

## 2018-11-17 NOTE — Patient Instructions (Signed)
Upper Respiratory Infection, Adult Most upper respiratory infections (URIs) are caused by a virus. A URI affects the nose, throat, and upper air passages. The most common type of URI is often called "the common cold." Follow these instructions at home:  Take medicines only as told by your doctor.  Gargle warm saltwater or take cough drops to comfort your throat as told by your doctor.  Use a humidifier or inhale steam from a shower to increase air moisture. This may make it easier to breathe.  Drink enough fluid to keep your pee (urine) clear or pale yellow.  Eat soups and other clear broths.  Have a healthy diet.  Rest as needed.  Go back to work when your fever is gone or your doctor says it is okay. ? You may need to stay home longer to avoid giving your URI to others. ? You can also wear a face mask and wash your hands often to prevent spread of the virus.  Use your inhaler more if you have asthma.  Do not use any tobacco products, including cigarettes, chewing tobacco, or electronic cigarettes. If you need help quitting, ask your doctor. Contact a doctor if:  You are getting worse, not better.  Your symptoms are not helped by medicine.  You have chills.  You are getting more short of breath.  You have brown or red mucus.  You have yellow or brown discharge from your nose.  You have pain in your face, especially when you bend forward.  You have a fever.  You have puffy (swollen) neck glands.  You have pain while swallowing.  You have white areas in the back of your throat. Get help right away if:  You have very bad or constant: ? Headache. ? Ear pain. ? Pain in your forehead, behind your eyes, and over your cheekbones (sinus pain). ? Chest pain.  You have long-lasting (chronic) lung disease and any of the following: ? Wheezing. ? Long-lasting cough. ? Coughing up blood. ? A change in your usual mucus.  You have a stiff neck.  You have changes in  your: ? Vision. ? Hearing. ? Thinking. ? Mood. This information is not intended to replace advice given to you by your health care provider. Make sure you discuss any questions you have with your health care provider. Document Released: 05/17/2008 Document Revised: 08/01/2016 Document Reviewed: 03/06/2014 Elsevier Interactive Patient Education  2018 [employer].

## 2018-11-17 NOTE — Progress Notes (Signed)
Kurt Williams - 67 y.o. male MRN [id number]  Date of birth: December 21, 1950  Subjective Chief Complaint  Patient presents with  . Cough    drainage     HPI Kurt Williams is a 67 y.o. male who complains of congestion, sore throat, post nasal drip and dry cough for 3 days. He denies a history of chest pain, chills, fatigue, fevers, myalgias, nausea, shortness of breath, vomiting, wheezing and sputum production and denies a history of asthma. Patient does not smoke cigarettes.  He is using claritin as needed without much improvement.   ROS:  A comprehensive ROS was completed and negative except as noted per HPI     No Known Allergies  Past Medical History:  Diagnosis Date  . Coronary artery disease   . GERD (gastroesophageal reflux disease)   . Headache(784.0)   . Migraine   . OSA on CPAP     Past Surgical History:  Procedure Laterality Date  . CORONARY ANGIOPLASTY WITH STENT PLACEMENT  2000  . KNEE ARTHROSCOPY  1963   on Right Knee with removal of pieces of shattered bone  . KNEE ARTHROSCOPY W/ ACL RECONSTRUCTION  1973   Left knee  . OTHER SURGICAL HISTORY  2011   to lower organs on left side that were pressing on diaphragm    Social History   Socioeconomic History  . Marital status: Married    Spouse name: Not on file  . Number of children: Not on file  . Years of education: Not on file  . Highest education level: Not on file  Occupational History  . Not on file  Social Needs  . Financial resource strain: Not on file  . Food insecurity:    Worry: Not on file    Inability: Not on file  . Transportation needs:    Medical: Not on file    Non-medical: Not on file  Tobacco Use  . Smoking status: Never Smoker  Substance and Sexual Activity  . Alcohol use: Yes    Comment: occasionaly  . Drug use: No  . Sexual activity: Yes  Lifestyle  . Physical activity:    Days per week: Not on file    Minutes per session: Not on file  . Stress: Not on file  Relationships  .  Social connections:    Talks on phone: Not on file    Gets together: Not on file    Attends religious service: Not on file    Active member of club or organization: Not on file    Attends meetings of clubs or organizations: Not on file    Relationship status: Not on file  Other Topics Concern  . Not on file  Social History Narrative  . Not on file    Family History  Problem Relation Age of Onset  . Hypertension Mother   . Cancer Mother        multiple meyloma  . Cancer Father        stomach cancer  . Diabetes Father     Health Maintenance  Topic Date Due  . Hepatitis C Screening  08/01/1951  . TETANUS/TDAP  03/23/1968  . COLONOSCOPY  03/24/1999  . PNA vac Low Risk Adult (1 of 2 - PCV13) 03/23/2014  . INFLUENZA VACCINE  07/13/2018    ----------------------------------------------------------------------------------------------------------------------------------------------------------------------------------------------------------------- Physical Exam BP 110/70   Pulse (!) 56   Temp 98.1 F (36.7 C)   Wt 253 lb 12.8 oz (115.1 kg)   SpO2 97%  BMI 34.42 kg/m   Physical Exam  Constitutional: He is oriented to person, place, and time. He appears well-nourished. No distress.  HENT:  Head: Normocephalic and atraumatic.  Mouth/Throat: Oropharynx is clear and moist.  Eyes: No scleral icterus.  Neck: Neck supple. No thyromegaly present.  Cardiovascular: Normal rate, regular rhythm and normal heart sounds.  Pulmonary/Chest: Effort normal and breath sounds normal.  Lymphadenopathy:    He has no cervical adenopathy.  Neurological: He is alert and oriented to person, place, and time.  Skin: Skin is warm and dry.  Psychiatric: He has a normal mood and affect. His behavior is normal.     ------------------------------------------------------------------------------------------------------------------------------------------------------------------------------------------------------------------- Assessment and Plan  URI (upper respiratory infection)  Symptomatic therapy suggested: push fluids, rest, use vaporizer or mist prn and return office visit prn if symptoms persist or worsen. Lack of antibiotic effectiveness discussed with him. Call or return to clinic prn if these symptoms worsen or fail to improve as anticipated.

## 2018-11-17 NOTE — Assessment & Plan Note (Signed)
  Symptomatic therapy suggested: push fluids, rest, use vaporizer or mist prn and return office visit prn if symptoms persist or worsen. Lack of antibiotic effectiveness discussed with him. Call or return to clinic prn if these symptoms worsen or fail to improve as anticipated.

## 2018-12-12 NOTE — Progress Notes (Deleted)
Subjective:   Kurt Williams is a 67 y.o. male who presents for an Initial Medicare Annual Wellness Visit.  Review of Systems No ROS.  Medicare Wellness Visit. Additional risk factors are reflected in the social history.   Sleep patterns:  Home Safety/Smoke Alarms: Feels safe in home. Smoke alarms in place.   Male:   CCS-     PSA- No results found for: PSA     Objective:    There were no vitals filed for this visit. There is no height or weight on file to calculate BMI.  Advanced Directives 01/13/2015 05/21/2012  Does Patient Have a Medical Advance Directive? No Patient does not have advance directive    Current Medications (verified) Outpatient Encounter Medications as of 12/14/2018  Medication Sig  . aspirin 81 MG chewable tablet Chew 81 mg by mouth daily.  . ASTAXANTHIN PO Take 12 mg by mouth daily.  Marland Kitchen Cod Liver Oil CAPS Take 1 capsule by mouth daily.  . meclizine (ANTIVERT) 12.5 MG tablet Take 1 tablet (12.5 mg total) by mouth 3 (three) times daily as needed for dizziness.  . nitroGLYCERIN (NITROSTAT) 0.4 MG SL tablet Place 1 tablet (0.4 mg total) under the tongue every 5 (five) minutes as needed for chest pain.  Marland Kitchen OVER THE COUNTER MEDICATION Take 1 tablet by mouth daily as needed. Allergy medication OTC  . sildenafil (REVATIO) 20 MG tablet Take 2-3 tabs as needed for erectile dysfunction.   No facility-administered encounter medications on file as of 12/14/2018.     Allergies (verified) Patient has no known allergies.   History: Past Medical History:  Diagnosis Date  . Coronary artery disease   . GERD (gastroesophageal reflux disease)   . Headache(784.0)   . Migraine   . OSA on CPAP    Past Surgical History:  Procedure Laterality Date  . CORONARY ANGIOPLASTY WITH STENT PLACEMENT  2000  . KNEE ARTHROSCOPY  1963   on Right Knee with removal of pieces of shattered bone  . KNEE ARTHROSCOPY W/ ACL RECONSTRUCTION  1973   Left knee  . OTHER SURGICAL HISTORY  2011   to lower organs on left side that were pressing on diaphragm   Family History  Problem Relation Age of Onset  . Hypertension Mother   . Cancer Mother        multiple meyloma  . Cancer Father        stomach cancer  . Diabetes Father    Social History   Socioeconomic History  . Marital status: Married    Spouse name: Not on file  . Number of children: Not on file  . Years of education: Not on file  . Highest education level: Not on file  Occupational History  . Not on file  Social Needs  . Financial resource strain: Not on file  . Food insecurity:    Worry: Not on file    Inability: Not on file  . Transportation needs:    Medical: Not on file    Non-medical: Not on file  Tobacco Use  . Smoking status: Never Smoker  Substance and Sexual Activity  . Alcohol use: Yes    Comment: occasionaly  . Drug use: No  . Sexual activity: Yes  Lifestyle  . Physical activity:    Days per week: Not on file    Minutes per session: Not on file  . Stress: Not on file  Relationships  . Social connections:    Talks on phone: Not  on file    Gets together: Not on file    Attends religious service: Not on file    Active member of club or organization: Not on file    Attends meetings of clubs or organizations: Not on file    Relationship status: Not on file  Other Topics Concern  . Not on file  Social History Narrative  . Not on file   Tobacco Counseling Counseling given: Not Answered   Clinical Intake:                       Activities of Daily Living No flowsheet data found.   Immunizations and Health Maintenance  There is no immunization history on file for this patient. Health Maintenance Due  Topic Date Due  . Hepatitis C Screening  02-Aug-1951  . TETANUS/TDAP  03/23/1968  . COLONOSCOPY  03/24/1999  . PNA vac Low Risk Adult (1 of 2 - PCV13) 03/23/2014  . INFLUENZA VACCINE  07/13/2018    Patient Care Team: Shelda Pal, DO as PCP - General  (Family Medicine) Leeroy Cha, MD as Consulting Physician (Neurosurgery)  Indicate any recent Medical Services you may have received from other than [facility] providers in the past year (date may be approximate).    Assessment:   This is a routine wellness examination for Kurt Williams. Physical assessment deferred to PCP.   Hearing/Vision screen No exam data present  Dietary issues and exercise activities discussed:   Diet (meal preparation, eat out, water intake, caffeinated beverages, dairy products, fruits and vegetables): {Desc; diets:16563} Breakfast: Lunch:  Dinner:      Goals   None    Depression Screen No flowsheet data found.  Fall Risk No flowsheet data found.    Cognitive Function:        Screening Tests Health Maintenance  Topic Date Due  . Hepatitis C Screening  October 11, 1951  . TETANUS/TDAP  03/23/1968  . COLONOSCOPY  03/24/1999  . PNA vac Low Risk Adult (1 of 2 - PCV13) 03/23/2014  . INFLUENZA VACCINE  07/13/2018        Plan:   ***  I have personally reviewed and noted the following in the patient's chart:   . Medical and social history . Use of alcohol, tobacco or illicit drugs  . Current medications and supplements . Functional ability and status . Nutritional status . Physical activity . Advanced directives . List of other physicians . Hospitalizations, surgeries, and ER visits in previous 12 months . Vitals . Screenings to include cognitive, depression, and falls . Referrals and appointments  In addition, I have reviewed and discussed with patient certain preventive protocols, quality metrics, and best practice recommendations. A written personalized care plan for preventive services as well as general preventive health recommendations were provided to patient.     Shela Nevin, [place]   12/12/2018

## 2018-12-14 ENCOUNTER — Ambulatory Visit: Payer: Medicare Other | Admitting: *Deleted

## 2018-12-14 ENCOUNTER — Ambulatory Visit: Payer: Medicare Other | Admitting: Family Medicine

## 2018-12-20 NOTE — Progress Notes (Signed)
Subjective:   Kurt Williams is a 68 y.o. male who presents for an Initial Medicare Annual Wellness Visit.  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   Describes health as fair, good or great? great   Review of Systems  No ROS.  Medicare Wellness Visit. Additional risk factors are reflected in the social history.  Sleep patterns: wears Cpap. Sleeps  Well 7hrs.  Home Safety/Smoke Alarms: Feels safe in home. Smoke alarms in place. Lives in 2 story home with wife, daughter, and grandson.    Male:   CCS-   5 yrs ago per pt at [facility].   PSA- No results found for: PSA     Objective:    Today's Vitals   12/21/18 1437  BP: 122/78  Pulse: (!) 54  SpO2: 96%  Weight: 256 lb (116.1 kg)   Body mass index is 34.72 kg/m.  Advanced Directives 12/21/2018 01/13/2015 05/21/2012  Does Patient Have a Medical Advance Directive? No No Patient does not have advance directive  Would patient like information on creating a medical advance directive? Yes (MAU/Ambulatory/Procedural Areas - Information given) - -    Current Medications (verified) Outpatient Encounter Medications as of 12/21/2018  Medication Sig  . ASTAXANTHIN PO Take 12 mg by mouth daily.  Marland Kitchen atorvastatin (LIPITOR) 40 MG tablet Take 1 tablet (40 mg total) by mouth daily.  Marland Kitchen Cod Liver Oil CAPS Take 1 capsule by mouth daily.  Marland Kitchen OVER THE COUNTER MEDICATION Take 1 tablet by mouth daily as needed. Allergy medication OTC  . sildenafil (REVATIO) 20 MG tablet Take 2-3 tabs as needed for erectile dysfunction.  Marland Kitchen aspirin 81 MG chewable tablet Chew 81 mg by mouth daily.  . nitroGLYCERIN (NITROSTAT) 0.4 MG SL tablet Place 1 tablet (0.4 mg total) under the tongue every 5 (five) minutes as needed for chest pain. (Patient not taking:  Reported on 12/21/2018)  . [DISCONTINUED] meclizine (ANTIVERT) 12.5 MG tablet Take 1 tablet (12.5 mg total) by mouth 3 (three) times daily as needed for dizziness.   No facility-administered encounter medications on file as of 12/21/2018.     Allergies (verified) Patient has no known allergies.   History: Past Medical History:  Diagnosis Date  . Coronary artery disease   . GERD (gastroesophageal reflux disease)   . Headache(784.0)   . Migraine   . OSA on CPAP    Past Surgical History:  Procedure Laterality Date  . CORONARY ANGIOPLASTY WITH STENT PLACEMENT  2000  . KNEE ARTHROSCOPY  1963   on Right Knee with removal of pieces of shattered bone  . KNEE ARTHROSCOPY W/ ACL RECONSTRUCTION  1973   Left knee  . OTHER SURGICAL HISTORY  2011   to lower organs on left side that were pressing on diaphragm   Family History  Problem Relation Age of Onset  . Hypertension Mother   . Cancer Mother        multiple meyloma  . Cancer Father        stomach cancer  . Diabetes Father   . Cancer Brother        throat   Social History   Socioeconomic History  . Marital status: Married    Spouse name: Not on file  . Number of children: Not on file  . Years  of education: Not on file  . Highest education level: Not on file  Occupational History  . Not on file  Social Needs  . Financial resource strain: Not on file  . Food insecurity:    Worry: Not on file    Inability: Not on file  . Transportation needs:    Medical: Not on file    Non-medical: Not on file  Tobacco Use  . Smoking status: Never Smoker  Substance and Sexual Activity  . Alcohol use: Yes    Comment: occasionaly   . Drug use: No  . Sexual activity: Yes  Lifestyle  . Physical activity:    Days per week: Not on file    Minutes per session: Not on file  . Stress: Not on file  Relationships  . Social connections:    Talks on phone: Not on file    Gets together: Not on file    Attends religious service: Not on file     Active member of club or organization: Not on file    Attends meetings of clubs or organizations: Not on file    Relationship status: Not on file  Other Topics Concern  . Not on file  Social History Narrative  . Not on file   Tobacco Counseling Counseling given: Not Answered   Clinical Intake: Pain : No/denies pain   Activities of Daily Living In your present state of health, do you have any difficulty performing the following activities: 12/21/2018  Hearing? N  Vision? N  Difficulty concentrating or making decisions? N  Comment Mentors to boys group  Walking or climbing stairs? Y  Comment bad knees  Dressing or bathing? N  Doing errands, shopping? N  Preparing Food and eating ? N  Using the Toilet? N  In the past six months, have you accidently leaked urine? N  Comment urgency  Do you have problems with loss of bowel control? N  Managing your Medications? N  Managing your Finances? N  Housekeeping or managing your Housekeeping? N  Some recent data might be hidden     Immunizations and Health Maintenance Immunization History  Administered Date(s) Administered  . Tdap 12/21/2018   Health Maintenance Due  Topic Date Due  . Hepatitis C Screening  Jun 21, 1951  . TETANUS/TDAP  03/23/1968  . PNA vac Low Risk Adult (2 of 2 - PPSV23) 12/17/2018    Patient Care Team: Shelda Pal, DO as PCP - General (Family Medicine) Leeroy Cha, MD as Consulting Physician (Neurosurgery)  Indicate any recent Medical Services you may have received from other than [facility] providers in the past year (date may be approximate).    Assessment:   This is a routine wellness examination for Kurt Williams. Physical assessment deferred to PCP.  Hearing/Vision screen  Hearing Screening   125Hz  250Hz  500Hz  1000Hz  2000Hz  3000Hz  4000Hz  6000Hz  8000Hz   Right ear:   Pass Pass Pass  Pass    Left ear:   Pass Pass Pass  Pass      Visual Acuity Screening   Right eye Left eye Both eyes  Without  correction: 20/63 20/40 20/32   With correction:       Dietary issues and exercise activities discussed: Current Exercise Habits: Home exercise routine, Type of exercise: stretching, Time (Minutes): 30, Frequency (Times/Week): 5, Weekly Exercise (Minutes/Week): 150, Intensity: Mild, Exercise limited by: None identified Diet (meal preparation, eat out, water intake, caffeinated beverages, dairy products, fruits and vegetables): well balanced, on average, 2 meals per day  Goals    . Increase physical activity      Depression Screen PHQ 2/9 Scores 12/21/2018  PHQ - 2 Score 0    Fall Risk Fall Risk  12/21/2018  Falls in the past year? 1  Number falls in past yr: 0  Injury with Fall? 0     Cognitive Function: MMSE - Mini Mental State Exam 12/21/2018  Orientation to time 5  Orientation to Place 5  Registration 3  Attention/ Calculation 5  Recall 3  Language- name 2 objects 2  Language- repeat 1  Language- follow 3 step command 3  Language- read & follow direction 1  Write a sentence 1  Copy design 1  Total score 30        Screening Tests Health Maintenance  Topic Date Due  . Hepatitis C Screening  01-14-1951  . TETANUS/TDAP  03/23/1968  . PNA vac Low Risk Adult (2 of 2 - PPSV23) 12/17/2018  . COLONOSCOPY  03/27/2025  . INFLUENZA VACCINE  Discontinued         Plan:    Please schedule your next medicare wellness visit with me in 1 yr.  Continue to eat heart healthy diet (full of fruits, vegetables, whole grains, lean protein, water--limit salt, fat, and sugar intake) and increase physical activity as tolerated.  Continue doing brain stimulating activities (puzzles, reading, adult coloring books, staying active) to keep memory sharp.   Bring a copy of your living will and/or healthcare power of attorney to your next office visit.    I have personally reviewed and noted the following in the patient's chart:   . Medical and social history . Use of alcohol, tobacco  or illicit drugs  . Current medications and supplements . Functional ability and status . Nutritional status . Physical activity . Advanced directives . List of other physicians . Hospitalizations, surgeries, and ER visits in previous 12 months . Vitals . Screenings to include cognitive, depression, and falls . Referrals and appointments  In addition, I have reviewed and discussed with patient certain preventive protocols, quality metrics, and best practice recommendations. A written personalized care plan for preventive services as well as general preventive health recommendations were provided to patient.     Shela Nevin, [place]   12/21/2018

## 2018-12-21 ENCOUNTER — Encounter: Payer: Self-pay | Admitting: Family Medicine

## 2018-12-21 ENCOUNTER — Ambulatory Visit (HOSPITAL_BASED_OUTPATIENT_CLINIC_OR_DEPARTMENT_OTHER)
Admission: RE | Admit: 2018-12-21 | Discharge: 2018-12-21 | Disposition: A | Discharge status: Home / Self Care | Payer: Medicare Other | Source: Ambulatory Visit | Attending: Family Medicine | Admitting: Family Medicine

## 2018-12-21 ENCOUNTER — Ambulatory Visit (INDEPENDENT_AMBULATORY_CARE_PROVIDER_SITE_OTHER): Payer: Medicare Other | Admitting: Family Medicine

## 2018-12-21 ENCOUNTER — Ambulatory Visit (INDEPENDENT_AMBULATORY_CARE_PROVIDER_SITE_OTHER): Payer: Medicare Other | Admitting: *Deleted

## 2018-12-21 ENCOUNTER — Encounter: Payer: Self-pay | Admitting: *Deleted

## 2018-12-21 VITALS — BP 122/78 | HR 54 | Ht 72.0 in | Wt 256.0 lb

## 2018-12-21 VITALS — BP 110/78 | HR 60 | Temp 98.5°F | Ht 72.0 in | Wt 257.2 lb

## 2018-12-21 DIAGNOSIS — Z1159 Encounter for screening for other viral diseases: Secondary | ICD-10-CM | POA: Diagnosis not present

## 2018-12-21 DIAGNOSIS — Z0000 Encounter for general adult medical examination without abnormal findings: Secondary | ICD-10-CM | POA: Diagnosis not present

## 2018-12-21 DIAGNOSIS — R109 Unspecified abdominal pain: Secondary | ICD-10-CM | POA: Insufficient documentation

## 2018-12-21 DIAGNOSIS — I2510 Atherosclerotic heart disease of native coronary artery without angina pectoris: Secondary | ICD-10-CM

## 2018-12-21 DIAGNOSIS — Z23 Encounter for immunization: Secondary | ICD-10-CM

## 2018-12-21 LAB — COMPREHENSIVE METABOLIC PANEL
ALT: 20 U/L (ref 0–53)
AST: 25 U/L (ref 0–37)
Albumin: 4 g/dL (ref 3.5–5.2)
Alkaline Phosphatase: 63 U/L (ref 39–117)
BILIRUBIN TOTAL: 0.4 mg/dL (ref 0.2–1.2)
BUN: 9 mg/dL (ref 6–23)
CALCIUM: 9.4 mg/dL (ref 8.4–10.5)
CHLORIDE: 105 meq/L (ref 96–112)
CO2: 27 meq/L (ref 19–32)
CREATININE: 1.31 mg/dL (ref 0.40–1.50)
GFR: 69.62 mL/min (ref >60.00)
Glucose, Bld: 95 mg/dL (ref 70–99)
Potassium: 4.2 mEq/L (ref 3.5–5.1)
SODIUM: 138 meq/L (ref 135–145)
Total Protein: 6.7 g/dL (ref 6.0–8.3)

## 2018-12-21 LAB — LIPID PANEL
CHOL/HDL RATIO: 3
Cholesterol: 158 mg/dL (ref 0–200)
HDL: 45.4 mg/dL (ref >39.00)
LDL CALC: 100 mg/dL — AB (ref 0–99)
NonHDL: 112.23
TRIGLYCERIDES: 60 mg/dL (ref 0.0–149.0)
VLDL: 12 mg/dL (ref 0.0–40.0)

## 2018-12-21 MED ORDER — ATORVASTATIN CALCIUM 40 MG PO TABS: 40.0000 mg | ORAL_TABLET | Freq: Every day | ORAL | 3 refills | Status: DC

## 2018-12-21 NOTE — Progress Notes (Signed)
Pre visit review using our clinic review tool, if applicable. No additional management support is needed unless otherwise documented below in the visit note. 

## 2018-12-21 NOTE — Progress Notes (Signed)
Chief Complaint  Patient presents with  . Follow-up    Subjective: Patient is a 68 y.o. male here for f/u.   CAD Hx of CAD, was rx'd Lipitor in past, not on currently. Denies CP or SOB. Diet is fair, stays active.   L sided abd pain since 2010. BM's seem to make it bette.r Had a surgery in 2010 and it started afterwards. Was told he had his organs in the abd pushing against his L lung/diaphragm. CTS performed surgery.   PCV23- 1 year ago Tdap- unsure Colonoscopy- 2016  ROS: Heart: Denies chest pain  Lungs: Denies SOB   Past Medical History:  Diagnosis Date  . Coronary artery disease   . GERD (gastroesophageal reflux disease)   . Headache(784.0)   . Migraine   . OSA on CPAP     Objective: BP 110/78 (BP Location: Left Arm, Patient Position: Sitting, Cuff Size: Large)   Pulse 60   Temp 98.5 F (36.9 C) (Oral)   Ht 6' (1.829 m)   Wt 257 lb 4 oz (116.7 kg)   SpO2 97%   BMI 34.89 kg/m  General: Awake, appears stated age HEENT: MMM, EOMi Heart: RRR, no murmurs Lungs: CTAB, no rales, wheezes or rhonchi. No accessory muscle use Abd: BS+, s, nd, mild ttp over L side, no masses or organomegaly.  Psych: Age appropriate judgment and insight, normal affect and mood  Assessment and Plan: Coronary artery disease involving native heart without angina pectoris, unspecified vessel or lesion type - Plan: atorvastatin (LIPITOR) 40 MG tablet, Comprehensive metabolic panel, Lipid panel  Encounter for hepatitis C screening test for low risk patient - Plan: Hepatitis C antibody  Left lateral abdominal pain - Plan: DG Abd 2 Views  Orders as above. Add statin. No CP or SOB.  Ck XR, if nml, will have him discuss case with his surgeon. Ck CT if unremarkable though.  F/u in 6 mo otherwise.  The patient voiced understanding and agreement to the plan.  [place], DO 12/21/18  9:09 AM

## 2018-12-21 NOTE — Addendum Note (Signed)
Addended by: Sharon Seller B on: 12/21/2018 09:18 AM   Modules accepted: Orders

## 2018-12-21 NOTE — Patient Instructions (Signed)
Please schedule your next medicare wellness visit with me in 1 yr.  Continue to eat heart healthy diet (full of fruits, vegetables, whole grains, lean protein, water--limit salt, fat, and sugar intake) and increase physical activity as tolerated.  Continue doing brain stimulating activities (puzzles, reading, adult coloring books, staying active) to keep memory sharp.   Bring a copy of your living will and/or healthcare power of attorney to your next office visit.   Kurt Williams , Thank you for taking time to come for your Medicare Wellness Visit. I appreciate your ongoing commitment to your health goals. Please review the following plan we discussed and let me know if I can assist you in the future.   These are the goals we discussed: Goals    . Increase physical activity       This is a list of the screening recommended for you and due dates:  Health Maintenance  Topic Date Due  .  Hepatitis C: One time screening is recommended by Center for Disease Control  (CDC) for  adults born from [birth year] through 1967.   23-Feb-1951  . Tetanus Vaccine  03/23/1968  . Pneumonia vaccines (2 of 2 - PPSV23) 12/17/2018  . Colon Cancer Screening  03/27/2025  . Flu Shot  Discontinued     Health Maintenance After Age 75 After age 77, you are at a higher risk for certain long-term diseases and infections as well as injuries from falls. Falls are a major cause of broken bones and head injuries in people who are older than age 47. Getting regular preventive care can help to keep you healthy and well. Preventive care includes getting regular testing and making lifestyle changes as recommended by your health care provider. Talk with your health care provider about:  Which screenings and tests you should have. A screening is a test that checks for a disease when you have no symptoms.  A diet and exercise plan that is right for you. What should I know about screenings and tests to prevent falls? Screening and  testing are the best ways to find a health problem early. Early diagnosis and treatment give you the best chance of managing medical conditions that are common after age 76. Certain conditions and lifestyle choices may make you more likely to have a fall. Your health care provider may recommend:  Regular vision checks. Poor vision and conditions such as cataracts can make you more likely to have a fall. If you wear glasses, make sure to get your prescription updated if your vision changes.  Medicine review. Work with your health care provider to regularly review all of the medicines you are taking, including over-the-counter medicines. Ask your health care provider about any side effects that may make you more likely to have a fall. Tell your health care provider if any medicines that you take make you feel dizzy or sleepy.  Osteoporosis screening. Osteoporosis is a condition that causes the bones to get weaker. This can make the bones weak and cause them to break more easily.  Blood pressure screening. Blood pressure changes and medicines to control blood pressure can make you feel dizzy.  Strength and balance checks. Your health care provider may recommend certain tests to check your strength and balance while standing, walking, or changing positions.  Foot health exam. Foot pain and numbness, as well as not wearing proper footwear, can make you more likely to have a fall.  Depression screening. You may be more likely to have a  fall if you have a fear of falling, feel emotionally low, or feel unable to do activities that you used to do.  Alcohol use screening. Using too much alcohol can affect your balance and may make you more likely to have a fall. What actions can I take to lower my risk of falls? General instructions  Talk with your health care provider about your risks for falling. Tell your health care provider if: ? You fall. Be sure to tell your health care provider about all falls,  even ones that seem minor. ? You feel dizzy, sleepy, or off-balance.  Take over-the-counter and prescription medicines only as told by your health care provider. These include any supplements.  Eat a healthy diet and maintain a healthy weight. A healthy diet includes low-fat dairy products, low-fat (lean) meats, and fiber from whole grains, beans, and lots of fruits and vegetables. Home safety  Remove any tripping hazards, such as rugs, cords, and clutter.  Install safety equipment such as grab bars in bathrooms and safety rails on stairs.  Keep rooms and walkways well-lit. Activity   Follow a regular exercise program to stay fit. This will help you maintain your balance. Ask your health care provider what types of exercise are appropriate for you.  If you need a cane or walker, use it as recommended by your health care provider.  Wear supportive shoes that have nonskid soles. Lifestyle  Do not drink alcohol if your health care provider tells you not to drink.  If you drink alcohol, limit how much you have: ? 0-1 drink a day for women. ? 0-2 drinks a day for men.  Be aware of how much alcohol is in your drink. In the [place] one drink equals one typical bottle of beer (12 oz), one-half glass of wine (5 oz), or one shot of hard liquor (1 oz).  Do not use any products that contain nicotine or tobacco, such as cigarettes and e-cigarettes. If you need help quitting, ask your health care provider. Summary  Having a healthy lifestyle and getting preventive care can help to protect your health and wellness after age 53.  Screening and testing are the best way to find a health problem early and help you avoid having a fall. Early diagnosis and treatment give you the best chance for managing medical conditions that are more common for people who are older than age 27.  Falls are a major cause of broken bones and head injuries in people who are older than age 52. Take precautions to  prevent a fall at home.  Work with your health care provider to learn what changes you can make to improve your health and wellness and to prevent falls. This information is not intended to replace advice given to you by your health care provider. Make sure you discuss any questions you have with your health care provider. Document Released: 10/12/2017 Document Revised: 10/12/2017 Document Reviewed: 10/12/2017 Elsevier Interactive Patient Education  2019 [employer].

## 2018-12-21 NOTE — Patient Instructions (Addendum)
Keep the diet clean and stay active.  Give [place] 2-3 business days to get the results of your labs back.   Go on the Lipitor to decrease your risk of heart attack/stroke.  We will be in touch regarding the results of your X-ray. If it is normal, I would recommend getting in touch with Dr. Lorelei Pont office. If they refuse to manage this, let me know.  Let us know if you need anything.

## 2018-12-21 NOTE — Progress Notes (Signed)
Noted. Agree with above.  [place], DO 12/21/18 5:01 PM

## 2018-12-22 LAB — HEPATITIS C ANTIBODY
Hepatitis C Ab: NONREACTIVE
SIGNAL TO CUT-OFF: 0.04 (ref <1.00)

## 2019-01-09 ENCOUNTER — Telehealth: Payer: Self-pay | Admitting: Family Medicine

## 2019-01-09 NOTE — Telephone Encounter (Signed)
That's fine. I'd rather he be on a lower dose than nothing at all.

## 2019-01-09 NOTE — Telephone Encounter (Signed)
Copied from [place] [id number]. 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 He states is was recommended that he start on 20mg . Please advise.   Has the patient contacted their pharmacy?  yes Preferred Pharmacy (with phone number or street name): [id number] - [place], [place] - [address] AT [place] [phone] (Phone) [phone] (Fax)

## 2019-01-10 MED ORDER — ATORVASTATIN CALCIUM 20 MG PO TABS: 20.0000 mg | ORAL_TABLET | Freq: Every day | ORAL | 1 refills | Status: DC

## 2019-01-10 NOTE — Telephone Encounter (Signed)
Patient informed and updated list and sent to pharmacy

## 2019-01-18 ENCOUNTER — Ambulatory Visit (INDEPENDENT_AMBULATORY_CARE_PROVIDER_SITE_OTHER): Payer: Medicare Other | Admitting: Family Medicine

## 2019-01-18 ENCOUNTER — Encounter: Payer: Self-pay | Admitting: Family Medicine

## 2019-01-18 VITALS — BP 120/64 | HR 79 | Temp 101.5°F | Ht 72.0 in | Wt 256.0 lb

## 2019-01-18 DIAGNOSIS — R6889 Other general symptoms and signs: Secondary | ICD-10-CM | POA: Diagnosis not present

## 2019-01-18 MED ORDER — OSELTAMIVIR PHOSPHATE 75 MG PO CAPS: 75.0000 mg | ORAL_CAPSULE | Freq: Two times a day (BID) | ORAL | 0 refills | Status: AC

## 2019-01-18 MED ORDER — PROMETHAZINE-DM 6.25-15 MG/5ML PO SYRP: 5.0000 mL | ORAL_SOLUTION | Freq: Four times a day (QID) | ORAL | 0 refills | Status: DC | PRN

## 2019-01-18 NOTE — Patient Instructions (Addendum)
Continue to push fluids, practice good hand hygiene, and cover your mouth if you cough.  If you start having increasing fevers, shaking or shortness of breath, seek immediate care.  OK to take Tylenol 1000 mg (2 extra strength tabs) or 975 mg (3 regular strength tabs) every 6 hours as needed.  Let us know if you need anything.

## 2019-01-18 NOTE — Progress Notes (Signed)
Chief Complaint  Patient presents with  . Fever    2 days  . Generalized Body Aches    Kurt Williams here for URI complaints.  Duration: 2 d Associated symptoms: subjective fever, rhinorrhea, ear fullness, shortness of breath, myalgia and cough Denies: sinus congestion, sinus pain, itchy watery eyes, ear pain, ear drainage, sore throat and wheezing Treatment to date: Mucinex, Tylenol Sick contacts: Yes- daughter  ROS:  Const: +chills HEENT: As noted in HPI Lungs: +cough  Past Medical History:  Diagnosis Date  . Coronary artery disease   . GERD (gastroesophageal reflux disease)   . Headache(784.0)   . Migraine   . OSA on CPAP     BP 120/64 (BP Location: Left Arm, Patient Position: Sitting, Cuff Size: Large)   Pulse 79   Temp (!) 101.5 F (38.6 C) (Oral)   Ht 6' (1.829 m)   Wt 256 lb (116.1 kg)   SpO2 93%   BMI 34.72 kg/m  General: Awake, alert, appears stated age HEENT: AT, [place], ears patent b/l and TM's neg, nares patent w/o discharge, pharynx pink and without exudates, MMM Neck: No masses or asymmetry Heart: RRR Lungs: CTAB, no accessory muscle use Psych: Age appropriate judgment and insight, normal mood and affect  Flu-like symptoms - Plan: promethazine-dextromethorphan (PROMETHAZINE-DM) 6.25-15 MG/5ML syrup, oseltamivir (TAMIFLU) 75 MG capsule  Orders as above. 3 major s/s's of flu. Tylenol.  Continue to push fluids, practice good hand hygiene, cover mouth when coughing. F/u prn. If starting to experience increasing fevers, shaking, or shortness of breath, seek immediate care. Pt voiced understanding and agreement to the plan.  [place], DO 01/18/19 3:37 PM

## 2019-01-29 ENCOUNTER — Encounter: Payer: Self-pay | Admitting: Family Medicine

## 2019-01-29 ENCOUNTER — Ambulatory Visit (INDEPENDENT_AMBULATORY_CARE_PROVIDER_SITE_OTHER): Payer: Medicare Other | Admitting: Family Medicine

## 2019-01-29 VITALS — BP 120/78 | HR 64 | Temp 98.7°F | Ht 72.0 in | Wt 252.1 lb

## 2019-01-29 DIAGNOSIS — R0989 Other specified symptoms and signs involving the circulatory and respiratory systems: Secondary | ICD-10-CM | POA: Diagnosis not present

## 2019-01-29 MED ORDER — METHYLPREDNISOLONE ACETATE 80 MG/ML IJ SUSP
80.0000 mg | Freq: Once | INTRAMUSCULAR | Status: AC
  Administered 2019-01-29: 80 mg via INTRAMUSCULAR

## 2019-01-29 MED ORDER — FLUTICASONE PROPIONATE 50 MCG/ACT NA SUSP: 2.0000 | Freq: Every day | NASAL | 2 refills | Status: DC

## 2019-01-29 NOTE — Addendum Note (Signed)
Addended by: Sharon Seller B on: 01/29/2019 04:23 PM   Modules accepted: Orders

## 2019-01-29 NOTE — Patient Instructions (Addendum)
Start using a nasal spray like Flonase. This is available over the counter, but we will also try to call it in.   Let me know if you start having fevers or new symptoms.   Let us know if you need anything.

## 2019-01-29 NOTE — Progress Notes (Signed)
Chief Complaint  Patient presents with  . Nasal Congestion  . Cough    Kurt Williams here for URI complaints.  Duration: 1 day  Associated symptoms: some cough, sinus fullness Denies: sinus congestion, sinus pain, rhinorrhea, itchy watery eyes, ear pain, ear drainage, sore throat, wheezing, shortness of breath, myalgia and fevers Treatment to date: Tylenol Sick contacts: No  ROS:  Const: Denies fevers HEENT: As noted in HPI Lungs: No SOB  Past Medical History:  Diagnosis Date  . Coronary artery disease   . GERD (gastroesophageal reflux disease)   . Headache(784.0)   . Migraine   . OSA on CPAP     BP 120/78 (BP Location: Left Arm, Patient Position: Sitting, Cuff Size: Large)   Pulse 64   Temp 98.7 F (37.1 C) (Oral)   Ht 6' (1.829 m)   Wt 252 lb 2 oz (114.4 kg)   SpO2 96%   BMI 34.19 kg/m  General: Awake, alert, appears stated age HEENT: AT, [place], ears patent b/l and TM's neg, nares patent w/o discharge, no sinus ttp; pharynx pink and without exudates, MMM Neck: No masses or asymmetry Heart: RRR Lungs: CTAB, no accessory muscle use Psych: Age appropriate judgment and insight, normal mood and affect  Sinus complaint - Plan: fluticasone (FLONASE) 50 MCG/ACT nasal spray  INCS, IM depo for sinus fullness. If no improvement, RTC.  Continue to push fluids, practice good hand hygiene, cover mouth when coughing. F/u prn. If starting to experience fevers, shaking, or shortness of breath, seek immediate care. Pt voiced understanding and agreement to the plan.  [place], DO 01/29/19 4:11 PM

## 2019-03-27 DIAGNOSIS — G4719 Other hypersomnia: Secondary | ICD-10-CM | POA: Diagnosis not present

## 2019-03-27 DIAGNOSIS — E6609 Other obesity due to excess calories: Secondary | ICD-10-CM | POA: Diagnosis not present

## 2019-03-27 DIAGNOSIS — G4733 Obstructive sleep apnea (adult) (pediatric): Secondary | ICD-10-CM | POA: Diagnosis not present

## 2019-03-27 DIAGNOSIS — J3089 Other allergic rhinitis: Secondary | ICD-10-CM | POA: Diagnosis not present

## 2019-03-27 DIAGNOSIS — Z6834 Body mass index (BMI) 34.0-34.9, adult: Secondary | ICD-10-CM | POA: Diagnosis not present

## 2019-06-13 ENCOUNTER — Telehealth: Payer: Self-pay

## 2019-06-13 NOTE — Telephone Encounter (Signed)
Copied from [place] [id number]. Topic: Quick Communication - Rx Refill/Question >> Jun 13, 2019  3:01 PM Erick Blinks wrote: Medication: Pt requesting an EpiPen. Please advise Best contact: [phone]

## 2019-06-13 NOTE — Telephone Encounter (Signed)
OK 

## 2019-06-19 MED ORDER — EPINEPHRINE 0.3 MG/0.3ML IJ SOAJ: 0.3000 mg | INTRAMUSCULAR | 1 refills | Status: AC | PRN

## 2019-06-19 NOTE — Addendum Note (Signed)
Addended by: Sharon Seller B on: 06/19/2019 08:54 AM   Modules accepted: Orders

## 2019-06-19 NOTE — Telephone Encounter (Signed)
done

## 2019-06-25 ENCOUNTER — Ambulatory Visit: Payer: Medicare Other | Admitting: Family Medicine

## 2019-07-02 ENCOUNTER — Encounter: Payer: Self-pay | Admitting: Family Medicine

## 2019-07-02 ENCOUNTER — Ambulatory Visit (INDEPENDENT_AMBULATORY_CARE_PROVIDER_SITE_OTHER): Payer: Medicare Other | Admitting: Family Medicine

## 2019-07-02 ENCOUNTER — Other Ambulatory Visit: Payer: Self-pay

## 2019-07-02 VITALS — BP 110/68 | HR 68 | Temp 98.6°F | Ht 72.0 in | Wt 252.4 lb

## 2019-07-02 DIAGNOSIS — I2510 Atherosclerotic heart disease of native coronary artery without angina pectoris: Secondary | ICD-10-CM | POA: Diagnosis not present

## 2019-07-02 DIAGNOSIS — T7840XD Allergy, unspecified, subsequent encounter: Secondary | ICD-10-CM

## 2019-07-02 LAB — COMPREHENSIVE METABOLIC PANEL
ALT: 13 U/L (ref 0–53)
AST: 20 U/L (ref 0–37)
Albumin: 4.2 g/dL (ref 3.5–5.2)
Alkaline Phosphatase: 60 U/L (ref 39–117)
BUN: 14 mg/dL (ref 6–23)
CO2: 26 mEq/L (ref 19–32)
Calcium: 9.1 mg/dL (ref 8.4–10.5)
Chloride: 106 mEq/L (ref 96–112)
Creatinine, Ser: 1.39 mg/dL (ref 0.40–1.50)
GFR: 61.08 mL/min (ref >60.00)
Glucose, Bld: 94 mg/dL (ref 70–99)
Potassium: 4 mEq/L (ref 3.5–5.1)
Sodium: 139 mEq/L (ref 135–145)
Total Bilirubin: 0.5 mg/dL (ref 0.2–1.2)
Total Protein: 6.7 g/dL (ref 6.0–8.3)

## 2019-07-02 LAB — LIPID PANEL
Cholesterol: 150 mg/dL (ref 0–200)
HDL: 47.4 mg/dL (ref >39.00)
LDL Cholesterol: 93 mg/dL (ref 0–99)
NonHDL: 102.77
Total CHOL/HDL Ratio: 3
Triglycerides: 48 mg/dL (ref 0.0–149.0)
VLDL: 9.6 mg/dL (ref 0.0–40.0)

## 2019-07-02 NOTE — Patient Instructions (Signed)
Give us 2-3 business days to get the results of your labs back.   Keep the diet clean and stay active.  Let us know if you need anything. 

## 2019-07-02 NOTE — Progress Notes (Signed)
Chief Complaint  Patient presents with  . Follow-up    Subjective: Hyperlipidemia Patient presents for Hyperlipidemia/CAD follow up. Currently taking atorvastatin 20 mg/d and compliance with treatment thus far has been good. He denies myalgias. He is usually adhering to a healthy diet. Exercise: stretching The patient is known to have coexisting coronary artery disease.  Allergies well controlled. Not needing the INCS currently, though the Flonase is helpful when s/s's flare.   ROS: Heart: Denies chest pain or palpitations Lungs: Denies SOB or cough  Past Medical History:  Diagnosis Date  . Coronary artery disease   . GERD (gastroesophageal reflux disease)   . Headache(784.0)   . Migraine   . OSA on CPAP     Objective: BP 110/68 (BP Location: Left Arm, Patient Position: Sitting, Cuff Size: Large)   Pulse 68   Temp 98.6 F (37 C) (Oral)   Ht 6' (1.829 m)   Wt 252 lb 6 oz (114.5 kg)   SpO2 98%   BMI 34.23 kg/m  General: Awake, appears stated age HEENT: MMM Heart: RRR, no LE edema, no bruits Lungs: CTAB, no rales, wheezes or rhonchi. No accessory muscle use Psych: Age appropriate judgment and insight, normal affect and mood  Assessment and Plan: Coronary artery disease involving native heart without angina pectoris, unspecified vessel or lesion type - Plan: Lipid panel, Comprehensive metabolic panel  Allergic state, subsequent encounter - Cont INCS and PO antihistamine prn.   Orders as above. F/u in 6 mo. The patient voiced understanding and agreement to the plan.  [place], DO 07/02/19  1:49 PM

## 2019-12-17 DIAGNOSIS — M67911 Unspecified disorder of synovium and tendon, right shoulder: Secondary | ICD-10-CM | POA: Diagnosis not present

## 2019-12-25 ENCOUNTER — Other Ambulatory Visit: Payer: Self-pay

## 2019-12-25 ENCOUNTER — Ambulatory Visit: Payer: Medicare Other | Admitting: *Deleted

## 2019-12-25 ENCOUNTER — Ambulatory Visit (INDEPENDENT_AMBULATORY_CARE_PROVIDER_SITE_OTHER): Payer: Medicare Other | Admitting: Family Medicine

## 2019-12-25 VITALS — BP 108/68 | HR 61 | Temp 97.4°F | Ht 72.0 in | Wt 261.0 lb

## 2019-12-25 DIAGNOSIS — I2510 Atherosclerotic heart disease of native coronary artery without angina pectoris: Secondary | ICD-10-CM | POA: Diagnosis not present

## 2019-12-25 DIAGNOSIS — H6123 Impacted cerumen, bilateral: Secondary | ICD-10-CM | POA: Diagnosis not present

## 2019-12-25 DIAGNOSIS — Z125 Encounter for screening for malignant neoplasm of prostate: Secondary | ICD-10-CM

## 2019-12-25 NOTE — Progress Notes (Signed)
CC: F/u  Subjective: Hyperlipidemia/CAD Patient presents for Hyperlipidemia/CAD follow up. Currently taking Lipitor 20 mg/d and compliance with treatment thus far has been good. He denies myalgias. He is adhering to a healthy diet. Exercise: stretching Taking ASA 81 mg/d.  ROS: Heart: Denies chest pain or palpitations Lungs: Denies SOB or cough  Past Medical History:  Diagnosis Date  . Coronary artery disease   . GERD (gastroesophageal reflux disease)   . Headache(784.0)   . Migraine   . OSA on CPAP     Objective: BP 108/68 (BP Location: Left Arm, Patient Position: Sitting, Cuff Size: Large)   Pulse 61   Temp (!) 97.4 F (36.3 C) (Temporal)   Ht 6' (1.829 m)   Wt 261 lb (118.4 kg)   SpO2 94%   BMI 35.40 kg/m  General: Awake, appears stated age HEENT: MMM; ears 100% obstructed w cerumen b/l.  Heart: RRR, no LE edema, no bruits Lungs: CTAB, no rales, wheezes or rhonchi. No accessory muscle use Psych: Age appropriate judgment and insight, normal affect and mood  Assessment and Plan: Coronary artery disease involving native heart without angina pectoris, unspecified vessel or lesion type - Plan: Comp Met (CMET), Lipid Profile  Screening PSA (prostate specific antigen) - Plan: PSA, Medicare ( [facility] only)  Bilateral impacted cerumen  Counseled on risks and benefits of prostate cancer screening, he agrees to undergo testing.  Ck labs. Counseled on diet and exercise. Ears irrigated w removal of wax today.  F/u in 6 mo for med ck. The patient voiced understanding and agreement to the plan.  [place], DO 12/25/19  3:28 PM

## 2019-12-25 NOTE — Patient Instructions (Addendum)
Give us 2-3 business days to get the results of your labs back.   Keep the diet clean and stay active.  OK to use Debrox (peroxide) in the ear to loosen up wax. Also recommend using a bulb syringe (for removing boogers from baby's noses) to flush through warm water. Do not use Q-tips as this can impact wax further.  Let us know if you need anything. 

## 2019-12-26 LAB — COMPREHENSIVE METABOLIC PANEL
ALT: 16 U/L (ref 0–53)
AST: 22 U/L (ref 0–37)
Albumin: 4.3 g/dL (ref 3.5–5.2)
Alkaline Phosphatase: 62 U/L (ref 39–117)
BUN: 14 mg/dL (ref 6–23)
CO2: 29 mEq/L (ref 19–32)
Calcium: 9.6 mg/dL (ref 8.4–10.5)
Chloride: 102 mEq/L (ref 96–112)
Creatinine, Ser: 1.41 mg/dL (ref 0.40–1.50)
GFR: 60 mL/min — ABNORMAL LOW (ref >60.00)
Glucose, Bld: 91 mg/dL (ref 70–99)
Potassium: 4.1 mEq/L (ref 3.5–5.1)
Sodium: 138 mEq/L (ref 135–145)
Total Bilirubin: 0.7 mg/dL (ref 0.2–1.2)
Total Protein: 7.2 g/dL (ref 6.0–8.3)

## 2019-12-26 LAB — LIPID PANEL
Cholesterol: 176 mg/dL (ref 0–200)
HDL: 58.6 mg/dL (ref >39.00)
LDL Cholesterol: 101 mg/dL — ABNORMAL HIGH (ref 0–99)
NonHDL: 117.67
Total CHOL/HDL Ratio: 3
Triglycerides: 82 mg/dL (ref 0.0–149.0)
VLDL: 16.4 mg/dL (ref 0.0–40.0)

## 2019-12-26 LAB — PSA, MEDICARE: PSA: 0.76 ng/ml (ref 0.10–4.00)

## 2019-12-27 NOTE — Progress Notes (Signed)
Virtual Visit via Audio Note  I connected with patient on 12/28/19 at  1:00 PM EST by audio enabled telemedicine application and verified that I am speaking with the correct person using two identifiers.   THIS ENCOUNTER IS A VIRTUAL VISIT DUE TO COVID-19 - PATIENT WAS NOT SEEN IN THE OFFICE. PATIENT HAS CONSENTED TO VIRTUAL VISIT / TELEMEDICINE VISIT   Location of patient: home  Location of provider: office  I discussed the limitations of evaluation and management by telemedicine and the availability of in person appointments. The patient expressed understanding and agreed to proceed.   Subjective:   SAMBA MADEIRA is a 69 y.o. male who presents for Medicare Annual/Subsequent preventive examination.  Pt founded Haematologist to [employer]  Review of Systems:   Home Safety/Smoke Alarms: Feels safe in home. Smoke alarms in place.  Lives in 2 story home w/ wife, dtr, and grandson. Has stair lift he can use if needed.  Male:   CCS- per pt done 6 yrs ago at [facility]. Will request records.   PSA-  Lab Results  Component Value Date   PSA 0.76 12/25/2019       Objective:    Vitals: Unable to assess. This visit is enabled though telemedicine due to Covid 19.   Advanced Directives 12/28/2019 12/21/2018 01/13/2015 05/21/2012  Does Patient Have a Medical Advance Directive? No No No Patient does not have advance directive  Would patient like information on creating a medical advance directive? No - Patient declined Yes (MAU/Ambulatory/Procedural Areas - Information given) - -    Tobacco Social History   Tobacco Use  Smoking Status Never Smoker  Smokeless Tobacco Never Used     Counseling given: Not Answered   Clinical Intake: Pain : No/denies pain   Past Medical History:  Diagnosis Date  . Coronary artery disease   . GERD (gastroesophageal reflux disease)   . Headache(784.0)   . Migraine   . OSA on CPAP    Past Surgical History:    Procedure Laterality Date  . CORONARY ANGIOPLASTY WITH STENT PLACEMENT  2000  . KNEE ARTHROSCOPY  1963   on Right Knee with removal of pieces of shattered bone  . KNEE ARTHROSCOPY W/ ACL RECONSTRUCTION  1973   Left knee  . OTHER SURGICAL HISTORY  2011   to lower organs on left side that were pressing on diaphragm   Family History  Problem Relation Age of Onset  . Hypertension Mother   . Cancer Mother        multiple meyloma  . Cancer Father        stomach cancer  . Diabetes Father   . Cancer Brother        throat   Social History   Socioeconomic History  . Marital status: Married    Spouse name: Not on file  . Number of children: Not on file  . Years of education: Not on file  . Highest education level: Not on file  Occupational History  . Not on file  Tobacco Use  . Smoking status: Never Smoker  . Smokeless tobacco: Never Used  Substance and Sexual Activity  . Alcohol use: Yes    Comment: occasionaly   . Drug use: No  . Sexual activity: Yes  Other Topics Concern  . Not on file  Social History Narrative  . Not on file   Social Determinants of Health   Financial Resource Strain:   . Difficulty of Paying  Living Expenses: Not on file  Food Insecurity:   . Worried About Charity fundraiser in the Last Year: Not on file  . Ran Out of Food in the Last Year: Not on file  Transportation Needs:   . Lack of Transportation (Medical): Not on file  . Lack of Transportation (Non-Medical): Not on file  Physical Activity:   . Days of Exercise per Week: Not on file  . Minutes of Exercise per Session: Not on file  Stress:   . Feeling of Stress : Not on file  Social Connections:   . Frequency of Communication with Friends and Family: Not on file  . Frequency of Social Gatherings with Friends and Family: Not on file  . Attends Religious Services: Not on file  . Active Member of Clubs or Organizations: Not on file  . Attends Archivist Meetings: Not on file  .  Marital Status: Not on file    Outpatient Encounter Medications as of 12/28/2019  Medication Sig  . aspirin 81 MG chewable tablet Chew 81 mg by mouth daily.  . ASTAXANTHIN PO Take 12 mg by mouth daily.  Marland Kitchen Cod Liver Oil CAPS Take 1 capsule by mouth daily.  Marland Kitchen ELDERBERRY PO Take by mouth.  . Garlic 10 MG CAPS Take by mouth.  Marland Kitchen OVER THE COUNTER MEDICATION Take 1 tablet by mouth daily as needed. Allergy medication OTC  . Zinc 15 MG CAPS Take by mouth.  Marland Kitchen atorvastatin (LIPITOR) 20 MG tablet Take 1 tablet (20 mg total) by mouth daily. (Patient not taking: Reported on 12/28/2019)  . EPINEPHrine 0.3 mg/0.3 mL IJ SOAJ injection Inject 0.3 mLs (0.3 mg total) into the muscle as needed for anaphylaxis. (Patient not taking: Reported on 12/28/2019)  . nitroGLYCERIN (NITROSTAT) 0.4 MG SL tablet Place 1 tablet (0.4 mg total) under the tongue every 5 (five) minutes as needed for chest pain. (Patient not taking: Reported on 12/28/2019)  . sildenafil (REVATIO) 20 MG tablet Take 2-3 tabs as needed for erectile dysfunction. (Patient not taking: Reported on 12/28/2019)   No facility-administered encounter medications on file as of 12/28/2019.    Activities of Daily Living In your present state of health, do you have any difficulty performing the following activities: 12/28/2019  Hearing? N  Vision? N  Difficulty concentrating or making decisions? N  Walking or climbing stairs? N  Dressing or bathing? N  Doing errands, shopping? N  Preparing Food and eating ? N  Using the Toilet? N  In the past six months, have you accidently leaked urine? N  Do you have problems with loss of bowel control? N  Managing your Medications? N  Managing your Finances? N  Housekeeping or managing your Housekeeping? N  Some recent data might be hidden    Patient Care Team: Shelda Pal, DO as PCP - General (Family Medicine) Leeroy Cha, MD as Consulting Physician (Neurosurgery) Belva Crome, MD as Consulting  Physician (Cardiology)   Assessment:   This is a routine wellness examination for [place]. Physical assessment deferred to PCP.  Exercise Activities and Dietary recommendations Current Exercise Habits: Home exercise routine, Type of exercise: treadmill, Frequency (Times/Week): 3, Intensity: Mild, Exercise limited by: None identified Diet (meal preparation, eat out, water intake, caffeinated beverages, dairy products, fruits and vegetables): 24 hr recall Breakfast: bacon, egg, cheese biscuit Lunch: chocolate cake, vegetable soup, drumette Dinner:  Bobbye Riggs, broccoli, rice.   Goals    . DIET - REDUCE FAT INTAKE    .  Increase physical activity       Fall Risk Fall Risk  12/28/2019 12/21/2018  Falls in the past year? 0 1  Number falls in past yr: 0 0  Injury with Fall? 1 0  Follow up Education provided;Falls prevention discussed -   Depression Screen PHQ 2/9 Scores 12/28/2019 12/21/2018  PHQ - 2 Score 0 0    Cognitive Function Ad8 score reviewed for issues:  Issues making decisions:no  Less interest in hobbies / activities:no  Repeats questions, stories (family complaining):no  Trouble using ordinary gadgets (microwave, computer, phone):no  Forgets the month or year: no  Mismanaging finances: no  Remembering appts:no  Daily problems with thinking and/or memory:no Ad8 score is=0     MMSE - Mini Mental State Exam 12/21/2018  Orientation to time 5  Orientation to Place 5  Registration 3  Attention/ Calculation 5  Recall 3  Language- name 2 objects 2  Language- repeat 1  Language- follow 3 step command 3  Language- read & follow direction 1  Write a sentence 1  Copy design 1  Total score 30        Immunization History  Administered Date(s) Administered  . Tdap 12/21/2018     Screening Tests Health Maintenance  Topic Date Due  . COLONOSCOPY  03/27/2025  . TETANUS/TDAP  12/21/2028  . Hepatitis C Screening  Completed  . PNA vac Low Risk Adult  Completed    . INFLUENZA VACCINE  Discontinued       Plan:    Please schedule your next medicare wellness visit with me in 1 yr.  Eat heart healthy diet (full of fruits, vegetables, whole grains, lean protein, water--limit salt, fat, and sugar intake) and increase physical activity as tolerated.  Continue doing brain stimulating activities (puzzles, reading, adult coloring books, staying active) to keep memory sharp.   Bring a copy of your living will and/or healthcare power of attorney to your next office visit.     I have personally reviewed and noted the following in the patient's chart:   . Medical and social history . Use of alcohol, tobacco or illicit drugs  . Current medications and supplements . Functional ability and status . Nutritional status . Physical activity . Advanced directives . List of other physicians . Hospitalizations, surgeries, and ER visits in previous 12 months . Vitals . Screenings to include cognitive, depression, and falls . Referrals and appointments  In addition, I have reviewed and discussed with patient certain preventive protocols, quality metrics, and best practice recommendations. A written personalized care plan for preventive services as well as general preventive health recommendations were provided to patient.     Shela Nevin, [place]  12/28/2019

## 2019-12-28 ENCOUNTER — Ambulatory Visit (INDEPENDENT_AMBULATORY_CARE_PROVIDER_SITE_OTHER): Payer: Medicare Other | Admitting: *Deleted

## 2019-12-28 ENCOUNTER — Other Ambulatory Visit: Payer: Self-pay

## 2019-12-28 ENCOUNTER — Encounter: Payer: Self-pay | Admitting: *Deleted

## 2019-12-28 DIAGNOSIS — Z0000 Encounter for general adult medical examination without abnormal findings: Secondary | ICD-10-CM

## 2019-12-28 NOTE — Patient Instructions (Signed)
Please schedule your next medicare wellness visit with me in 1 yr.  Eat heart healthy diet (full of fruits, vegetables, whole grains, lean protein, water--limit salt, fat, and sugar intake) and increase physical activity as tolerated.  Continue doing brain stimulating activities (puzzles, reading, adult coloring books, staying active) to keep memory sharp.   Bring a copy of your living will and/or healthcare power of attorney to your next office visit.   Mr. Kurt Williams , Thank you for taking time to come for your Medicare Wellness Visit. I appreciate your ongoing commitment to your health goals. Please review the following plan we discussed and let me know if I can assist you in the future.   These are the goals we discussed: Goals    . DIET - REDUCE FAT INTAKE    . Increase physical activity       This is a list of the screening recommended for you and due dates:  Health Maintenance  Topic Date Due  . Colon Cancer Screening  03/27/2025  . Tetanus Vaccine  12/21/2028  .  Hepatitis C: One time screening is recommended by Center for Disease Control  (CDC) for  adults born from [birth year] through 1967.   Completed  . Pneumonia vaccines  Completed  . Flu Shot  Discontinued    Preventive Care 56 Years and Older, Male Preventive care refers to lifestyle choices and visits with your health care provider that can promote health and wellness. This includes:  A yearly physical exam. This is also called an annual well check.  Regular dental and eye exams.  Immunizations.  Screening for certain conditions.  Healthy lifestyle choices, such as diet and exercise. What can I expect for my preventive care visit? Physical exam Your health care provider will check:  Height and weight. These may be used to calculate body mass index (BMI), which is a measurement that tells if you are at a healthy weight.  Heart rate and blood pressure.  Your skin for abnormal spots. Counseling Your health care  provider may ask you questions about:  Alcohol, tobacco, and drug use.  Emotional well-being.  Home and relationship well-being.  Sexual activity.  Eating habits.  History of falls.  Memory and ability to understand (cognition).  Work and work Statistician. What immunizations do I need?  Influenza (flu) vaccine  This is recommended every year. Tetanus, diphtheria, and pertussis (Tdap) vaccine  You may need a Td booster every 10 years. Varicella (chickenpox) vaccine  You may need this vaccine if you have not already been vaccinated. Zoster (shingles) vaccine  You may need this after age 26. Pneumococcal conjugate (PCV13) vaccine  One dose is recommended after age 34. Pneumococcal polysaccharide (PPSV23) vaccine  One dose is recommended after age 16. Measles, mumps, and rubella (MMR) vaccine  You may need at least one dose of MMR if you were born in 1957 or later. You may also need a second dose. Meningococcal conjugate (MenACWY) vaccine  You may need this if you have certain conditions. Hepatitis A vaccine  You may need this if you have certain conditions or if you travel or work in places where you may be exposed to hepatitis A. Hepatitis B vaccine  You may need this if you have certain conditions or if you travel or work in places where you may be exposed to hepatitis B. Haemophilus influenzae type b (Hib) vaccine  You may need this if you have certain conditions. You may receive vaccines as individual doses or  as more than one vaccine together in one shot (combination vaccines). Talk with your health care provider about the risks and benefits of combination vaccines. What tests do I need? Blood tests  Lipid and cholesterol levels. These may be checked every 5 years, or more frequently depending on your overall health.  Hepatitis C test.  Hepatitis B test. Screening  Lung cancer screening. You may have this screening every year starting at age 62 if you  have a 30-pack-year history of smoking and currently smoke or have quit within the past 15 years.  Colorectal cancer screening. All adults should have this screening starting at age 67 and continuing until age 1. Your health care provider may recommend screening at age 62 if you are at increased risk. You will have tests every 1-10 years, depending on your results and the type of screening test.  Prostate cancer screening. Recommendations will vary depending on your family history and other risks.  Diabetes screening. This is done by checking your blood sugar (glucose) after you have not eaten for a while (fasting). You may have this done every 1-3 years.  Abdominal aortic aneurysm (AAA) screening. You may need this if you are a current or former smoker.  Sexually transmitted disease (STD) testing. Follow these instructions at home: Eating and drinking  Eat a diet that includes fresh fruits and vegetables, whole grains, lean protein, and low-fat dairy products. Limit your intake of foods with high amounts of sugar, saturated fats, and salt.  Take vitamin and mineral supplements as recommended by your health care provider.  Do not drink alcohol if your health care provider tells you not to drink.  If you drink alcohol: ? Limit how much you have to 0-2 drinks a day. ? Be aware of how much alcohol is in your drink. In the [place] one drink equals one 12 oz bottle of beer (355 mL), one 5 oz glass of wine (148 mL), or one 1 oz glass of hard liquor (44 mL). Lifestyle  Take daily care of your teeth and gums.  Stay active. Exercise for at least 30 minutes on 5 or more days each week.  Do not use any products that contain nicotine or tobacco, such as cigarettes, e-cigarettes, and chewing tobacco. If you need help quitting, ask your health care provider.  If you are sexually active, practice safe sex. Use a condom or other form of protection to prevent STIs (sexually transmitted  infections).  Talk with your health care provider about taking a low-dose aspirin or statin. What's next?  Visit your health care provider once a year for a well check visit.  Ask your health care provider how often you should have your eyes and teeth checked.  Stay up to date on all vaccines. This information is not intended to replace advice given to you by your health care provider. Make sure you discuss any questions you have with your health care provider. Document Revised: 11/23/2018 Document Reviewed: 11/23/2018 Elsevier Patient Education  2020 [employer].

## 2020-01-24 ENCOUNTER — Encounter: Payer: Self-pay | Admitting: Family Medicine

## 2020-01-28 DIAGNOSIS — Z20828 Contact with and (suspected) exposure to other viral communicable diseases: Secondary | ICD-10-CM | POA: Diagnosis not present

## 2020-01-28 DIAGNOSIS — Z20822 Contact with and (suspected) exposure to covid-19: Secondary | ICD-10-CM | POA: Diagnosis not present

## 2020-02-12 NOTE — Progress Notes (Signed)
Cardiology Office Note:    Date:  02/13/2020   ID:  Clementeen Hoof, DOB 06-08-1951, MRN [id number]  PCP:  Shelda Pal, DO  Cardiologist:  No primary care provider on file.   Referring MD: Shelda Pal*   Chief Complaint  Patient presents with  . Coronary Artery Disease  . Hyperlipidemia    History of Present Illness:    XIANG REUM is a 69 y.o. male with a hx of OSA, CAD with remote circumflex BMS 2000 and shown patent 2001, prior syncope, GERD, hyperlipidemia, and morbid obesity.  He had coronary stent implantation with bare metal device in 2000.  A repeat catheterization greater than 10 years ago demonstrated widely patent coronary arteries.  He never had symptoms prior to stent implantation.  I am not sure what the indication was as the procedure was done by someone outside our practice.  He currently feels well.  He is here to have a heart check.  He says I saw his wife last year.  He has some dyspnea on exertion that he feels is related to deconditioning.  He has difficulty with ambulation because of bilateral severe knee arthritis.  He denies orthopnea, PND, claudication, exertional chest tightness, and peripheral edema.  He does calisthenics to and occasionally 3 times per week without difficulty with breathing or chest discomfort.\  He is 1 of 12 siblings.  1 brother died related to diabetes complications.  There is no family history of CAD.  The patient's history is personally negative for hypertension, diabetes, and hyperlipidemia.  He smokes an occasional cigarette.  Past Medical History:  Diagnosis Date  . Coronary artery disease   . GERD (gastroesophageal reflux disease)   . Headache(784.0)   . Migraine   . OSA on CPAP     Past Surgical History:  Procedure Laterality Date  . CORONARY ANGIOPLASTY WITH STENT PLACEMENT  2000  . KNEE ARTHROSCOPY  1963   on Right Knee with removal of pieces of shattered bone  . KNEE ARTHROSCOPY W/ ACL  RECONSTRUCTION  1973   Left knee  . OTHER SURGICAL HISTORY  2011   to lower organs on left side that were pressing on diaphragm    Current Medications: Current Meds  Medication Sig  . aspirin 81 MG chewable tablet Chew 81 mg by mouth daily.  . ASTAXANTHIN PO Take 12 mg by mouth daily.  Marland Kitchen Cod Liver Oil CAPS Take 1 capsule by mouth daily.  Marland Kitchen ELDERBERRY PO Take by mouth.  . EPINEPHrine 0.3 mg/0.3 mL IJ SOAJ injection Inject 0.3 mLs (0.3 mg total) into the muscle as needed for anaphylaxis.  . Garlic 10 MG CAPS Take by mouth.  . nitroGLYCERIN (NITROSTAT) 0.4 MG SL tablet Place 1 tablet (0.4 mg total) under the tongue every 5 (five) minutes as needed for chest pain.  Marland Kitchen OVER THE COUNTER MEDICATION Take 1 tablet by mouth daily as needed. Allergy medication OTC  . sildenafil (REVATIO) 20 MG tablet Take 2-3 tabs as needed for erectile dysfunction.  . Vitamin D, Cholecalciferol, 50 MCG (2000 UT) CAPS Take 2,000 Units by mouth daily.  . Zinc 15 MG CAPS Take by mouth.     Allergies:   Patient has no known allergies.   Social History   Socioeconomic History  . Marital status: Married    Spouse name: Not on file  . Number of children: Not on file  . Years of education: Not on file  . Highest education level: Not on  file  Occupational History  . Not on file  Tobacco Use  . Smoking status: Never Smoker  . Smokeless tobacco: Never Used  Substance and Sexual Activity  . Alcohol use: Yes    Comment: occasionaly   . Drug use: No  . Sexual activity: Yes  Other Topics Concern  . Not on file  Social History Narrative  . Not on file   Social Determinants of Health   Financial Resource Strain: Low Risk   . Difficulty of Paying Living Expenses: Not hard at all  Food Insecurity:   . Worried About Charity fundraiser in the Last Year: Not on file  . Ran Out of Food in the Last Year: Not on file  Transportation Needs:   . Lack of Transportation (Medical): Not on file  . Lack of  Transportation (Non-Medical): Not on file  Physical Activity:   . Days of Exercise per Week: Not on file  . Minutes of Exercise per Session: Not on file  Stress:   . Feeling of Stress : Not on file  Social Connections:   . Frequency of Communication with Friends and Family: Not on file  . Frequency of Social Gatherings with Friends and Family: Not on file  . Attends Religious Services: Not on file  . Active Member of Clubs or Organizations: Not on file  . Attends Archivist Meetings: Not on file  . Marital Status: Not on file     Family History: The patient's family history includes Cancer in his brother, father, and mother; Diabetes in his father; Hypertension in his mother.  ROS:   Please see the history of present illness.    He is COVID-19 vaccine averse.  He feels the vaccines have been produced too quickly and therefore is concerned that history may repeat itself with missed treatment on minorities. He stopped taking statin therapy and prefers homeopathic/naturopathic agents.  His current regimen includes cod liver oil, elderberry, garlic, C. Miles, and vitamin D 2000 IU/day.  All other systems reviewed and are negative.  EKGs/Labs/Other Studies Reviewed:    The following studies were reviewed today: No recent cardiac evaluation of any sort  EKG:  EKG sinus bradycardia at 58 bpm with nonspecific ST abnormality.  Recent Labs: 12/25/2019: ALT 16; BUN 14; Creatinine, Ser 1.41; Potassium 4.1; Sodium 138  Recent Lipid Panel    Component Value Date/Time   CHOL 176 12/25/2019 1453   TRIG 82.0 12/25/2019 1453   HDL 58.60 12/25/2019 1453   CHOLHDL 3 12/25/2019 1453   VLDL 16.4 12/25/2019 1453   LDLCALC 101 (H) 12/25/2019 1453    Physical Exam:    VS:  BP 124/76   Pulse (!) 58   Ht 6' (1.829 m)   Wt 260 lb 3.2 oz (118 kg)   SpO2 98%   BMI 35.29 kg/m     Wt Readings from Last 3 Encounters:  02/13/20 260 lb 3.2 oz (118 kg)  12/25/19 261 lb (118.4 kg)   07/02/19 252 lb 6 oz (114.5 kg)     GEN: Moderate obesity. No acute distress HEENT: Normal NECK: No JVD. LYMPHATICS: No lymphadenopathy CARDIAC:  RRR without murmur, gallop, or edema. VASCULAR:  Normal Pulses. No bruits. RESPIRATORY:  Clear to auscultation without rales, wheezing or rhonchi  ABDOMEN: Soft, non-tender, non-distended, No pulsatile mass, MUSCULOSKELETAL: No deformity  SKIN: Warm and dry NEUROLOGIC:  Alert and oriented x 3 PSYCHIATRIC:  Normal affect   ASSESSMENT:    1. Coronary artery disease  involving native heart with other form of angina pectoris, unspecified vessel or lesion type ([place])   2. OSA on CPAP   3. Essential hypertension   4. Morbid obesity ([place])   5. Educated about COVID-19 virus infection   6. DOE (dyspnea on exertion)   7. Screening for diabetes mellitus    PLAN:    In order of problems listed above:  1. Clinically negative for symptoms consistent with angina.  Secondary prevention discussed.  He falls short in many areas including suboptimal lipid management, sedentary lifestyle due to bilateral knee discomfort, and obesity. 2. He is compliant with CPAP. 3. Blood pressure is well controlled 4. Needs to lose weight.  Encourage aerobic activity and reduction in carbohydrate consumption. 5. Discussed the COVID-19 vaccine and encouraged the patient to take it.  He is pretty much a definite no on the vaccine because he is suspicious the vaccine is a big lie. 6. He does have dyspnea on exertion, possibly related to deconditioning.  With history of prior circumflex stenting, a coronary CT angiogram with FFR if indicated will be performed to exclude progression of CAD. 7. Hemoglobin A1c will be obtained when the basic metabolic panel to allow CT scanning is done.  Overall education and awareness concerning primary/secondary risk prevention was discussed in detail: LDL less than 70, hemoglobin A1c less than 7, blood pressure target less than 130/80 mmHg,  >150 minutes of moderate aerobic activity per week, avoidance of smoking, weight control (via diet and exercise), and continued surveillance/management of/for obstructive sleep apnea.    Medication Adjustments/Labs and Tests Ordered: Current medicines are reviewed at length with the patient today.  Concerns regarding medicines are outlined above.  Orders Placed This Encounter  Procedures  . CT CORONARY MORPH W/CTA COR W/SCORE W/CA W/CM &/OR WO/CM  . CT CORONARY FRACTIONAL FLOW RESERVE DATA PREP  . CT CORONARY FRACTIONAL FLOW RESERVE FLUID ANALYSIS  . HgB A1c  . Basic metabolic panel  . EKG 12-Lead   Meds ordered this encounter  Medications  . metoprolol tartrate (LOPRESSOR) 50 MG tablet    Sig: Take one tablet by mouth 2 hours prior to CT    Dispense:  1 tablet    Refill:  0    Patient Instructions  Medication Instructions:  Your physician recommends that you continue on your current medications as directed. Please refer to the Current Medication list given to you today.  *If you need a refill on your cardiac medications before your next appointment, please call your pharmacy*   Lab Work: A1C and BMET prior to Coronary CT  If you have labs (blood work) drawn today and your tests are completely normal, you will receive your results only by: Marland Kitchen MyChart Message (if you have MyChart) OR . A paper copy in the mail If you have any lab test that is abnormal or we need to change your treatment, we will call you to review the results.   Testing/Procedures: Your physician recommends that you have a Coronary CT performed.    Follow-Up: At [facility], you and your health needs are our priority.  As part of our continuing mission to provide you with exceptional heart care, we have created designated Provider Care Teams.  These Care Teams include your primary Cardiologist (physician) and Advanced Practice Providers (APPs -  Physician Assistants and Nurse Practitioners) who all work  together to provide you with the care you need, when you need it.  We recommend signing up for the patient portal  called "MyChart".  Sign up information is provided on this After Visit Summary.  MyChart is used to connect with patients for Virtual Visits (Telemedicine).  Patients are able to view lab/test results, encounter notes, upcoming appointments, etc.  Non-urgent messages can be sent to your provider as well.   To learn more about what you can do with MyChart, go to [email or web address].    Your next appointment:   12 month(s)  The format for your next appointment:   In Person  Provider:   You may see Dr. Daneen Schick or one of the following Advanced Practice Providers on your designated Care Team:    Truitt Merle, NP  Cecilie Kicks, NP  Kathyrn Drown, NP    Other Instructions  Your cardiac CT will be scheduled at one of the below locations:   [facility] [address] [place], [place] [address] [phone]  [place] [address] [place], [place] [address] [phone]  If scheduled at [facility] please arrive at the [facility] main entrance of [facility] 30 minutes prior to test start time. Proceed to the [facility] Radiology Department (first floor) to check-in and test prep.  If scheduled at [facility], please arrive 15 mins early for check-in and test prep.  Please follow these instructions carefully (unless otherwise directed):  Hold all erectile dysfunction medications at least 3 days (72 hrs) prior to test.  On the Night Before the Test: . Be sure to Drink plenty of water. . Do not consume any caffeinated/decaffeinated beverages or chocolate 12 hours prior to your test. . Do not take any antihistamines 12 hours prior to your test. . If you take Metformin do not take 24 hours prior to test.  On the Day of the Test: . Drink plenty of  water. Do not drink any water within one hour of the test. . Do not eat any food 4 hours prior to the test. . You may take your regular medications prior to the test.  . Take metoprolol (Lopressor) two hours prior to test. . HOLD Furosemide/Hydrochlorothiazide morning of the test.        After the Test: . Drink plenty of water. . After receiving IV contrast, you may experience a mild flushed feeling. This is normal. . On occasion, you may experience a mild rash up to 24 hours after the test. This is not dangerous. If this occurs, you can take Benadryl 25 mg and increase your fluid intake. . If you experience trouble breathing, this can be serious. If it is severe call 911 IMMEDIATELY. If it is mild, please call our office. . If you take any of these medications: Glipizide/Metformin, Avandament, Glucavance, please do not take 48 hours after completing test unless otherwise instructed.   Once we have confirmed authorization from your insurance company, we will call you to set up a date and time for your test.   For non-scheduling related questions, please contact the cardiac imaging nurse navigator should you have any questions/concerns: Marchia Bond, RN Navigator Cardiac Imaging [facility] [phone] mobile       Signed, Sinclair Grooms, MD  02/13/2020 3:45 PM    [place]

## 2020-02-13 ENCOUNTER — Encounter: Payer: Self-pay | Admitting: Interventional Cardiology

## 2020-02-13 ENCOUNTER — Ambulatory Visit (INDEPENDENT_AMBULATORY_CARE_PROVIDER_SITE_OTHER): Payer: Medicare Other | Admitting: Interventional Cardiology

## 2020-02-13 ENCOUNTER — Other Ambulatory Visit: Payer: Self-pay

## 2020-02-13 VITALS — BP 124/76 | HR 58 | Ht 72.0 in | Wt 260.2 lb

## 2020-02-13 DIAGNOSIS — Z7189 Other specified counseling: Secondary | ICD-10-CM

## 2020-02-13 DIAGNOSIS — Z131 Encounter for screening for diabetes mellitus: Secondary | ICD-10-CM

## 2020-02-13 DIAGNOSIS — R0600 Dyspnea, unspecified: Secondary | ICD-10-CM | POA: Diagnosis not present

## 2020-02-13 DIAGNOSIS — G4733 Obstructive sleep apnea (adult) (pediatric): Secondary | ICD-10-CM

## 2020-02-13 DIAGNOSIS — I25118 Atherosclerotic heart disease of native coronary artery with other forms of angina pectoris: Secondary | ICD-10-CM

## 2020-02-13 DIAGNOSIS — Z9989 Dependence on other enabling machines and devices: Secondary | ICD-10-CM | POA: Diagnosis not present

## 2020-02-13 DIAGNOSIS — I10 Essential (primary) hypertension: Secondary | ICD-10-CM | POA: Diagnosis not present

## 2020-02-13 DIAGNOSIS — R0609 Other forms of dyspnea: Secondary | ICD-10-CM

## 2020-02-13 MED ORDER — METOPROLOL TARTRATE 50 MG PO TABS: ORAL_TABLET | ORAL | 0 refills | Status: DC

## 2020-02-13 NOTE — Patient Instructions (Addendum)
Medication Instructions:  Your physician recommends that you continue on your current medications as directed. Please refer to the Current Medication list given to you today.  *If you need a refill on your cardiac medications before your next appointment, please call your pharmacy*   Lab Work: A1C and BMET prior to Coronary CT  If you have labs (blood work) drawn today and your tests are completely normal, you will receive your results only by: Marland Kitchen MyChart Message (if you have MyChart) OR . A paper copy in the mail If you have any lab test that is abnormal or we need to change your treatment, we will call you to review the results.   Testing/Procedures: Your physician recommends that you have a Coronary CT performed.    Follow-Up: At [facility], you and your health needs are our priority.  As part of our continuing mission to provide you with exceptional heart care, we have created designated Provider Care Teams.  These Care Teams include your primary Cardiologist (physician) and Advanced Practice Providers (APPs -  Physician Assistants and Nurse Practitioners) who all work together to provide you with the care you need, when you need it.  We recommend signing up for the patient portal called "MyChart".  Sign up information is provided on this After Visit Summary.  MyChart is used to connect with patients for Virtual Visits (Telemedicine).  Patients are able to view lab/test results, encounter notes, upcoming appointments, etc.  Non-urgent messages can be sent to your provider as well.   To learn more about what you can do with MyChart, go to [email or web address].    Your next appointment:   12 month(s)  The format for your next appointment:   In Person  Provider:   You may see Dr. Daneen Schick or one of the following Advanced Practice Providers on your designated Care Team:    Truitt Merle, NP  Cecilie Kicks, NP  Kathyrn Drown, NP    Other Instructions  Your cardiac  CT will be scheduled at one of the below locations:   [facility] [address] [place], [place] [address] [phone]  [place] [address] [place], [place] [address] [phone]  If scheduled at [facility], please arrive at the [facility] main entrance of [facility] 30 minutes prior to test start time. Proceed to the [facility] Radiology Department (first floor) to check-in and test prep.  If scheduled at [facility], please arrive 15 mins early for check-in and test prep.  Please follow these instructions carefully (unless otherwise directed):  Hold all erectile dysfunction medications at least 3 days (72 hrs) prior to test.  On the Night Before the Test: . Be sure to Drink plenty of water. . Do not consume any caffeinated/decaffeinated beverages or chocolate 12 hours prior to your test. . Do not take any antihistamines 12 hours prior to your test. . If you take Metformin do not take 24 hours prior to test.  On the Day of the Test: . Drink plenty of water. Do not drink any water within one hour of the test. . Do not eat any food 4 hours prior to the test. . You may take your regular medications prior to the test.  . Take metoprolol (Lopressor) two hours prior to test. . HOLD Furosemide/Hydrochlorothiazide morning of the test.        After the Test: . Drink plenty of water. Marland Kitchen  After receiving IV contrast, you may experience a mild flushed feeling. This is normal. . On occasion, you may experience a mild rash up to 24 hours after the test. This is not dangerous. If this occurs, you can take Benadryl 25 mg and increase your fluid intake. . If you experience trouble breathing, this can be serious. If it is severe call 911 IMMEDIATELY. If it is mild, please call our office. . If you take any of these medications: Glipizide/Metformin, Avandament, Glucavance,  please do not take 48 hours after completing test unless otherwise instructed.   Once we have confirmed authorization from your insurance company, we will call you to set up a date and time for your test.   For non-scheduling related questions, please contact the cardiac imaging nurse navigator should you have any questions/concerns: Marchia Bond, RN Navigator Cardiac Imaging [facility] [phone] mobile

## 2020-02-15 ENCOUNTER — Other Ambulatory Visit: Payer: Self-pay | Admitting: *Deleted

## 2020-02-15 DIAGNOSIS — Z131 Encounter for screening for diabetes mellitus: Secondary | ICD-10-CM

## 2020-02-15 DIAGNOSIS — I25118 Atherosclerotic heart disease of native coronary artery with other forms of angina pectoris: Secondary | ICD-10-CM

## 2020-02-15 DIAGNOSIS — R0609 Other forms of dyspnea: Secondary | ICD-10-CM

## 2020-02-15 DIAGNOSIS — I10 Essential (primary) hypertension: Secondary | ICD-10-CM

## 2020-02-22 DIAGNOSIS — Z20822 Contact with and (suspected) exposure to covid-19: Secondary | ICD-10-CM | POA: Diagnosis not present

## 2020-02-29 ENCOUNTER — Telehealth: Payer: Self-pay | Admitting: Interventional Cardiology

## 2020-02-29 NOTE — Telephone Encounter (Signed)
Spoke with pt and he inquired about other possible testing to replace the CT.  Advised there is another test we could do to check for blockages but this gives [place] more information.  Pt agreeable to continue with plan for CT.

## 2020-02-29 NOTE — Telephone Encounter (Signed)
Patient states she is requesting too speak with Dr. Thompson Caul nurse in regards to [place] scheduled for 03/10/20. Please return call to discuss.

## 2020-03-03 DIAGNOSIS — I25118 Atherosclerotic heart disease of native coronary artery with other forms of angina pectoris: Secondary | ICD-10-CM | POA: Diagnosis not present

## 2020-03-03 DIAGNOSIS — I10 Essential (primary) hypertension: Secondary | ICD-10-CM | POA: Diagnosis not present

## 2020-03-03 DIAGNOSIS — R0600 Dyspnea, unspecified: Secondary | ICD-10-CM | POA: Diagnosis not present

## 2020-03-03 DIAGNOSIS — Z131 Encounter for screening for diabetes mellitus: Secondary | ICD-10-CM | POA: Diagnosis not present

## 2020-03-04 LAB — HEMOGLOBIN A1C
Est. average glucose Bld gHb Est-mCnc: 123 mg/dL
Hgb A1c MFr Bld: 5.9 % — ABNORMAL HIGH (ref 4.8–5.6)

## 2020-03-04 LAB — BASIC METABOLIC PANEL
BUN/Creatinine Ratio: 7 — ABNORMAL LOW (ref 10–24)
BUN: 9 mg/dL (ref 8–27)
CO2: 21 mmol/L (ref 20–29)
Calcium: 9.6 mg/dL (ref 8.6–10.2)
Chloride: 104 mmol/L (ref 96–106)
Creatinine, Ser: 1.29 mg/dL — ABNORMAL HIGH (ref 0.76–1.27)
GFR calc Af Amer: 65 mL/min/{1.73_m2} (ref >59)
GFR calc non Af Amer: 56 mL/min/{1.73_m2} — ABNORMAL LOW (ref >59)
Glucose: 94 mg/dL (ref 65–99)
Potassium: 3.9 mmol/L (ref 3.5–5.2)
Sodium: 139 mmol/L (ref 134–144)

## 2020-03-06 ENCOUNTER — Telehealth: Payer: Self-pay | Admitting: Interventional Cardiology

## 2020-03-06 NOTE — Telephone Encounter (Signed)
Attempted to contact pt.  Phone rang several times and then a recording came on asking for a remote access code and then disconnected the call.

## 2020-03-06 NOTE — Telephone Encounter (Signed)
Attempted to contact pt again.  Still no answer or VM.

## 2020-03-06 NOTE — Telephone Encounter (Signed)
° °  Pt would like to apeak with Dr. Tamala Julian nurse regarding his upcoming CT on 03/10/20. He said he has some questions   Please call

## 2020-03-07 NOTE — Telephone Encounter (Signed)
Spoke with pt and he said he thought he remembered being told to take a medication prior to his CT.  Advised pt a one time dose of Metoprolol was sent in to his pharmacy at the time of his appt.  Advised pt to take this 2 hours prior to his CT.  Pt verbalized understanding and was appreciative for call.

## 2020-03-09 ENCOUNTER — Telehealth (HOSPITAL_COMMUNITY): Payer: Self-pay | Admitting: Emergency Medicine

## 2020-03-09 ENCOUNTER — Encounter (HOSPITAL_COMMUNITY): Payer: Self-pay

## 2020-03-09 NOTE — Telephone Encounter (Signed)
Attempted to call patient regarding upcoming cardiac CT appointment. °Left message on voicemail with name and callback number °Nicholette Dolson RN Navigator Cardiac Imaging °[facility] °[phone] Office °[phone] Cell ° °

## 2020-03-10 ENCOUNTER — Other Ambulatory Visit: Payer: Self-pay

## 2020-03-10 ENCOUNTER — Telehealth (HOSPITAL_COMMUNITY): Payer: Self-pay | Admitting: Emergency Medicine

## 2020-03-10 ENCOUNTER — Encounter (HOSPITAL_COMMUNITY): Payer: Self-pay

## 2020-03-10 ENCOUNTER — Ambulatory Visit (HOSPITAL_COMMUNITY)
Admission: RE | Admit: 2020-03-10 | Discharge: 2020-03-10 | Disposition: A | Discharge status: Home / Self Care | Payer: Medicare Other | Source: Ambulatory Visit | Attending: Interventional Cardiology | Admitting: Interventional Cardiology

## 2020-03-10 DIAGNOSIS — I25118 Atherosclerotic heart disease of native coronary artery with other forms of angina pectoris: Secondary | ICD-10-CM | POA: Diagnosis not present

## 2020-03-10 DIAGNOSIS — R0600 Dyspnea, unspecified: Secondary | ICD-10-CM | POA: Diagnosis not present

## 2020-03-10 DIAGNOSIS — R0609 Other forms of dyspnea: Secondary | ICD-10-CM

## 2020-03-10 MED ORDER — IOHEXOL 350 MG/ML SOLN
80.0000 mL | Freq: Once | INTRAVENOUS | Status: AC | PRN
  Administered 2020-03-10: 100 mL via INTRAVENOUS

## 2020-03-10 MED ORDER — NITROGLYCERIN 0.4 MG SL SUBL
SUBLINGUAL_TABLET | SUBLINGUAL | Status: AC
  Filled 2020-03-10: qty 2

## 2020-03-10 MED ORDER — NITROGLYCERIN 0.4 MG SL SUBL
0.8000 mg | SUBLINGUAL_TABLET | Freq: Once | SUBLINGUAL | Status: AC
  Administered 2020-03-10: 0.8 mg via SUBLINGUAL

## 2020-03-10 NOTE — Telephone Encounter (Signed)
Pt returning phone call regarding upcoming cardiac imaging study; pt verbalizes understanding of appt date/time, parking situation and where to check in, pre-test NPO status and medications ordered, and verified current allergies; name and call back number provided for further questions should they arise Marchia Bond RN Navigator Cardiac Imaging [facility] [phone] office [phone] cell  Verified with pt when to take PO metoprolol. States he will take right now. Thanked me for the [place]

## 2020-03-26 DIAGNOSIS — E669 Obesity, unspecified: Secondary | ICD-10-CM | POA: Diagnosis not present

## 2020-03-26 DIAGNOSIS — G4733 Obstructive sleep apnea (adult) (pediatric): Secondary | ICD-10-CM | POA: Diagnosis not present

## 2020-05-13 DIAGNOSIS — M79675 Pain in left toe(s): Secondary | ICD-10-CM | POA: Diagnosis not present

## 2020-05-13 DIAGNOSIS — M79674 Pain in right toe(s): Secondary | ICD-10-CM | POA: Diagnosis not present

## 2020-05-13 DIAGNOSIS — B351 Tinea unguium: Secondary | ICD-10-CM | POA: Diagnosis not present

## 2020-06-24 ENCOUNTER — Ambulatory Visit: Payer: Medicare Other | Admitting: Family Medicine

## 2020-06-24 DIAGNOSIS — R001 Bradycardia, unspecified: Secondary | ICD-10-CM | POA: Diagnosis not present

## 2020-06-24 DIAGNOSIS — R918 Other nonspecific abnormal finding of lung field: Secondary | ICD-10-CM | POA: Diagnosis not present

## 2020-06-24 DIAGNOSIS — R42 Dizziness and giddiness: Secondary | ICD-10-CM | POA: Diagnosis not present

## 2020-06-24 DIAGNOSIS — J168 Pneumonia due to other specified infectious organisms: Secondary | ICD-10-CM | POA: Diagnosis not present

## 2020-06-24 DIAGNOSIS — J189 Pneumonia, unspecified organism: Secondary | ICD-10-CM | POA: Diagnosis not present

## 2020-06-24 DIAGNOSIS — Z0289 Encounter for other administrative examinations: Secondary | ICD-10-CM

## 2020-06-25 DIAGNOSIS — M6281 Muscle weakness (generalized): Secondary | ICD-10-CM | POA: Diagnosis not present

## 2020-06-25 DIAGNOSIS — Z1152 Encounter for screening for COVID-19: Secondary | ICD-10-CM | POA: Diagnosis not present

## 2020-06-27 ENCOUNTER — Other Ambulatory Visit: Payer: Self-pay

## 2020-06-27 ENCOUNTER — Ambulatory Visit (INDEPENDENT_AMBULATORY_CARE_PROVIDER_SITE_OTHER): Payer: Medicare Other | Admitting: Family Medicine

## 2020-06-27 ENCOUNTER — Other Ambulatory Visit: Payer: Self-pay | Admitting: Family Medicine

## 2020-06-27 ENCOUNTER — Encounter: Payer: Self-pay | Admitting: Family Medicine

## 2020-06-27 VITALS — BP 114/72 | HR 64 | Temp 98.1°F | Ht 72.0 in | Wt 251.5 lb

## 2020-06-27 DIAGNOSIS — I2583 Coronary atherosclerosis due to lipid rich plaque: Secondary | ICD-10-CM | POA: Diagnosis not present

## 2020-06-27 DIAGNOSIS — I2510 Atherosclerotic heart disease of native coronary artery without angina pectoris: Secondary | ICD-10-CM | POA: Diagnosis not present

## 2020-06-27 DIAGNOSIS — D649 Anemia, unspecified: Secondary | ICD-10-CM

## 2020-06-27 DIAGNOSIS — R42 Dizziness and giddiness: Secondary | ICD-10-CM | POA: Diagnosis not present

## 2020-06-27 DIAGNOSIS — R001 Bradycardia, unspecified: Secondary | ICD-10-CM | POA: Diagnosis not present

## 2020-06-27 DIAGNOSIS — Z955 Presence of coronary angioplasty implant and graft: Secondary | ICD-10-CM | POA: Diagnosis not present

## 2020-06-27 LAB — CBC
HCT: 37.5 % — ABNORMAL LOW (ref 39.0–52.0)
Hemoglobin: 12.4 g/dL — ABNORMAL LOW (ref 13.0–17.0)
MCHC: 33 g/dL (ref 30.0–36.0)
MCV: 92 fl (ref 78.0–100.0)
Platelets: 175 10*3/uL (ref 150.0–400.0)
RBC: 4.08 Mil/uL — ABNORMAL LOW (ref 4.22–5.81)
RDW: 14 % (ref 11.5–15.5)
WBC: 5.7 10*3/uL (ref 4.0–10.5)

## 2020-06-27 MED ORDER — ONDANSETRON 4 MG PO TBDP: 4.0000 mg | ORAL_TABLET | Freq: Three times a day (TID) | ORAL | 0 refills | Status: DC | PRN

## 2020-06-27 MED ORDER — ATORVASTATIN CALCIUM 40 MG PO TABS: 40.0000 mg | ORAL_TABLET | Freq: Every day | ORAL | 3 refills | Status: DC

## 2020-06-27 NOTE — Patient Instructions (Addendum)
Stay hydrated.   Give [place] 2-3 business days to get the results of your labs back.   Follow up with cardiology team.   Get up slowly.  Stop doxy after Monday.   Let us know if you need anything.   Orthostatic Hypotension Blood pressure is a measurement of how strongly, or weakly, your blood is pressing against the walls of your arteries. Orthostatic hypotension is a sudden drop in blood pressure that happens when you quickly change positions, such as when you get up from sitting or lying down. Arteries are blood vessels that carry blood from your heart throughout your body. When blood pressure is too low, you may not get enough blood to your brain or to the rest of your organs. This can cause weakness, light-headedness, rapid heartbeat, and fainting. This can last for just a few seconds or for up to a few minutes. Orthostatic hypotension is usually not a serious problem. However, if it happens frequently or gets worse, it may be a sign of something more serious. What are the causes? This condition may be caused by:  Sudden changes in posture, such as standing up quickly after you have been sitting or lying down.  Blood loss.  Loss of body fluids (dehydration).  Heart problems.  Hormone (endocrine) problems.  Pregnancy.  Severe infection.  Lack of certain nutrients.  Severe allergic reactions (anaphylaxis).  Certain medicines, such as blood pressure medicine or medicines that make the body lose excess fluids (diuretics). Sometimes, this condition can be caused by not taking medicine as directed, such as taking too much of a certain medicine. What increases the risk? The following factors may make you more likely to develop this condition:  Age. Risk increases as you get older.  Conditions that affect the heart or the central nervous system.  Taking certain medicines, such as blood pressure medicine or diuretics.  Being pregnant. What are the signs or symptoms? Symptoms  of this condition may include:  Weakness.  Light-headedness.  Dizziness.  Blurred vision.  Fatigue.  Rapid heartbeat.  Fainting, in severe cases. How is this diagnosed? This condition is diagnosed based on:  Your medical history.  Your symptoms.  Your blood pressure measurement. Your health care provider will check your blood pressure when you are: ? Lying down. ? Sitting. ? Standing. A blood pressure reading is recorded as two numbers, such as "120 over 80" (or 120/80). The first ("top") number is called the systolic pressure. It is a measure of the pressure in your arteries as your heart beats. The second ("bottom") number is called the diastolic pressure. It is a measure of the pressure in your arteries when your heart relaxes between beats. Blood pressure is measured in a unit called mm Hg. Healthy blood pressure for most adults is 120/80. If your blood pressure is below 90/60, you may be diagnosed with hypotension. Other information or tests that may be used to diagnose orthostatic hypotension include:  Your other vital signs, such as your heart rate and temperature.  Blood tests.  Tilt table test. For this test, you will be safely secured to a table that moves you from a lying position to an upright position. Your heart rhythm and blood pressure will be monitored during the test. How is this treated? This condition may be treated by:  Changing your diet. This may involve eating more salt (sodium) or drinking more water.  Taking medicines to raise your blood pressure.  Changing the dosage of certain medicines you  are taking that might be lowering your blood pressure.  Wearing compression stockings. These stockings help to prevent blood clots and reduce swelling in your legs. In some cases, you may need to go to the hospital for:  Fluid replacement. This means you will receive fluids through an IV.  Blood replacement. This means you will receive donated blood  through an IV (transfusion).  Treating an infection or heart problems, if this applies.  Monitoring. You may need to be monitored while medicines that you are taking wear off. Follow these instructions at home: Eating and drinking   Drink enough fluid to keep your urine pale yellow.  Eat a healthy diet, and follow instructions from your health care provider about eating or drinking restrictions. A healthy diet includes: ? Fresh fruits and vegetables. ? Whole grains. ? Lean meats. ? Low-fat dairy products.  Eat extra salt only as directed. Do not add extra salt to your diet unless your health care provider told you to do that.  Eat frequent, small meals.  Avoid standing up suddenly after eating. Medicines  Take over-the-counter and prescription medicines only as told by your health care provider. ? Follow instructions from your health care provider about changing the dosage of your current medicines, if this applies. ? Do not stop or adjust any of your medicines on your own. General instructions   Wear compression stockings as told by your health care provider.  Get up slowly from lying down or sitting positions. This gives your blood pressure a chance to adjust.  Avoid hot showers and excessive heat as directed by your health care provider.  Return to your normal activities as told by your health care provider. Ask your health care provider what activities are safe for you.  Do not use any products that contain nicotine or tobacco, such as cigarettes, e-cigarettes, and chewing tobacco. If you need help quitting, ask your health care provider.  Keep all follow-up visits as told by your health care provider. This is important. Contact a health care provider if you:  Vomit.  Have diarrhea.  Have a fever for more than 2-3 days.  Feel more thirsty than usual.  Feel weak and tired. Get help right away if you:  Have chest pain.  Have a fast or irregular  heartbeat.  Develop numbness in any part of your body.  Cannot move your arms or your legs.  Have trouble speaking.  Become sweaty or feel light-headed.  Faint.  Feel short of breath.  Have trouble staying awake.  Feel confused. Summary  Orthostatic hypotension is a sudden drop in blood pressure that happens when you quickly change positions.  Orthostatic hypotension is usually not a serious problem.  It is diagnosed by having your blood pressure taken lying down, sitting, and then standing.  It may be treated by changing your diet or adjusting your medicines. This information is not intended to replace advice given to you by your health care provider. Make sure you discuss any questions you have with your health care provider. Document Revised: 05/25/2018 Document Reviewed: 05/25/2018 Elsevier Patient Education  [place].

## 2020-06-27 NOTE — Progress Notes (Signed)
Chief Complaint  Patient presents with  . Follow-up    ED    Kurt Williams is 69 y.o. pt here for dizziness.  Duration: 6 days Pass out? No Spinning? Yes at first, now more lightheadedness No triggers for dizziness. Sometimes rising position can cause this.  Recent illness/fever? Dx'd w pneumonia via CXR on 7/13; no fevers Headache? No Neurologic signs? Yes Change in PO intake? No  Palpitations? No +associated nausea/stomach discomfort CT head neg in ED.  Has appt w cardiologist this afternoon.  Of note, he was supposed to be taking Lipitor 40 mg/d but has not since running out. Unsure when this happened.   ROS:  Neuro: As noted in HPI Eyes: No vision changes  Past Medical History:  Diagnosis Date  . Coronary artery disease   . GERD (gastroesophageal reflux disease)   . Headache(784.0)   . Migraine   . OSA on CPAP     Family History  Problem Relation Age of Onset  . Hypertension Mother   . Cancer Mother        multiple meyloma  . Cancer Father        stomach cancer  . Diabetes Father   . Cancer Brother        throat    Allergies as of 06/27/2020   No Known Allergies     Medication List       Accurate as of June 27, 2020  8:37 AM. If you have any questions, ask your nurse or doctor.        aspirin 81 MG chewable tablet Chew 81 mg by mouth daily.   ASTAXANTHIN PO Take 12 mg by mouth daily.   atorvastatin 40 MG tablet Commonly known as: LIPITOR Take 1 tablet (40 mg total) by mouth daily. Started by: Shelda Pal, DO   Cod Liver Oil Caps Take 1 capsule by mouth daily.   ELDERBERRY PO Take by mouth.   EPINEPHrine 0.3 mg/0.3 mL Soaj injection Commonly known as: EPI-PEN Inject 0.3 mLs (0.3 mg total) into the muscle as needed for anaphylaxis.   Garlic 10 MG Caps Take by mouth.   metoprolol tartrate 50 MG tablet Commonly known as: LOPRESSOR Take one tablet by mouth 2 hours prior to CT   nitroGLYCERIN 0.4 MG SL tablet Commonly  known as: Nitrostat Place 1 tablet (0.4 mg total) under the tongue every 5 (five) minutes as needed for chest pain.   ondansetron 4 MG disintegrating tablet Commonly known as: ZOFRAN-ODT Take 1 tablet (4 mg total) by mouth every 8 (eight) hours as needed for nausea or vomiting. Started by: Shelda Pal, DO   OVER THE COUNTER MEDICATION Take 1 tablet by mouth daily as needed. Allergy medication OTC   sildenafil 20 MG tablet Commonly known as: REVATIO Take 2-3 tabs as needed for erectile dysfunction.   Vitamin D (Cholecalciferol) 50 MCG (2000 UT) Caps Take 2,000 Units by mouth daily.   Zinc 15 MG Caps Take by mouth.       BP 120/70 (BP Location: Left Arm, Patient Position: Sitting, Cuff Size: Large)   Pulse 64   Temp 98.1 F (36.7 C) (Oral)   Ht 6' (1.829 m)   Wt 251 lb 8 oz (114.1 kg)   SpO2 99%   BMI 34.11 kg/m  General: Awake, alert, appears stated age Eyes: PERRLA, EOMi Ears: Patent, TM's neg b/l Heart: RRR, no murmurs, no carotid bruits Lungs: CTAB, no accessory muscle use MSK: 5/5 strength throughout, gait  normal Neuro: No cerebellar signs, patellar reflex 1/4 b/l wo clonus, calcaneal reflex 0/4 b/l wo clonus, biceps reflex 1/4 b/l wo clonus; Dix-Hall-Pike negative b/l. Psych: Age appropriate judgment and insight, normal mood and affect  Lightheadedness  Coronary artery disease involving native heart without angina pectoris, unspecified vessel or lesion type - Plan: atorvastatin (LIPITOR) 40 MG tablet  Anemia, unspecified type - Plan: CBC  F/u on labs. Pt has appt w cards this afternoon. Orthostatics neg. Stay hydrated, get up slowly. CT head neg.  Restart Lipitor. Follows w cards.  Pt voiced understanding and agreement to the plan.  [place], DO 06/27/20 8:37 AM

## 2020-07-25 ENCOUNTER — Other Ambulatory Visit (INDEPENDENT_AMBULATORY_CARE_PROVIDER_SITE_OTHER): Payer: Medicare Other

## 2020-07-25 ENCOUNTER — Other Ambulatory Visit: Payer: Self-pay

## 2020-07-25 DIAGNOSIS — D649 Anemia, unspecified: Secondary | ICD-10-CM | POA: Diagnosis not present

## 2020-07-28 LAB — CBC (INCLUDES DIFF/PLT) WITH PATHOLOGIST REVIEW
Absolute Monocytes: 813 cells/uL (ref 200–950)
Basophils Absolute: 32 cells/uL (ref 0–200)
Basophils Relative: 0.5 %
Eosinophils Absolute: 77 cells/uL (ref 15–500)
Eosinophils Relative: 1.2 %
HCT: 37.2 % — ABNORMAL LOW (ref 38.5–50.0)
Hemoglobin: 12.3 g/dL — ABNORMAL LOW (ref 13.2–17.1)
Lymphs Abs: 1926 cells/uL (ref 850–3900)
MCH: 30.1 pg (ref 27.0–33.0)
MCHC: 33.1 g/dL (ref 32.0–36.0)
MCV: 91.2 fL (ref 80.0–100.0)
MPV: 11.2 fL (ref 7.5–12.5)
Monocytes Relative: 12.7 %
Neutro Abs: 3552 cells/uL (ref 1500–7800)
Neutrophils Relative %: 55.5 %
Platelets: 190 10*3/uL (ref 140–400)
RBC: 4.08 10*6/uL — ABNORMAL LOW (ref 4.20–5.80)
RDW: 12.8 % (ref 11.0–15.0)
Total Lymphocyte: 30.1 %
WBC: 6.4 10*3/uL (ref 3.8–10.8)

## 2020-09-24 ENCOUNTER — Ambulatory Visit: Payer: Self-pay

## 2020-09-24 ENCOUNTER — Ambulatory Visit (INDEPENDENT_AMBULATORY_CARE_PROVIDER_SITE_OTHER): Payer: Medicare Other | Admitting: Orthopaedic Surgery

## 2020-09-24 ENCOUNTER — Telehealth: Payer: Self-pay

## 2020-09-24 DIAGNOSIS — M1711 Unilateral primary osteoarthritis, right knee: Secondary | ICD-10-CM | POA: Diagnosis not present

## 2020-09-24 DIAGNOSIS — M25562 Pain in left knee: Secondary | ICD-10-CM

## 2020-09-24 DIAGNOSIS — G8929 Other chronic pain: Secondary | ICD-10-CM | POA: Diagnosis not present

## 2020-09-24 DIAGNOSIS — M1712 Unilateral primary osteoarthritis, left knee: Secondary | ICD-10-CM | POA: Insufficient documentation

## 2020-09-24 DIAGNOSIS — I25118 Atherosclerotic heart disease of native coronary artery with other forms of angina pectoris: Secondary | ICD-10-CM | POA: Diagnosis not present

## 2020-09-24 DIAGNOSIS — M25561 Pain in right knee: Secondary | ICD-10-CM

## 2020-09-24 MED ORDER — LIDOCAINE HCL 1 % IJ SOLN
3.0000 mL | INTRAMUSCULAR | Status: AC | PRN
  Administered 2020-09-24: 3 mL

## 2020-09-24 MED ORDER — METHYLPREDNISOLONE ACETATE 40 MG/ML IJ SUSP
40.0000 mg | INTRAMUSCULAR | Status: AC | PRN
  Administered 2020-09-24: 40 mg via INTRA_ARTICULAR

## 2020-09-24 NOTE — Telephone Encounter (Signed)
Patient called in saying he is itching over his body ,  Wants to know what to do says to contact him on # below   [phone]

## 2020-09-24 NOTE — Telephone Encounter (Signed)
See below Had a cortisone injection today

## 2020-09-24 NOTE — Telephone Encounter (Signed)
He should take some Benadryl and that will help.  He can also take an over-the-counter Claritin or Zyrtec which will also help.

## 2020-09-24 NOTE — Telephone Encounter (Signed)
Noted  

## 2020-09-24 NOTE — Telephone Encounter (Signed)
Bilateral gel knees  

## 2020-09-24 NOTE — Progress Notes (Signed)
Office Visit Note   Patient: Kurt Williams           Date of Birth: Apr 21, 1951           MRN: [id number] Visit Date: 09/24/2020              Requested by: Shelda Pal, [place] [address] [place],  [place] [id number] PCP: Shelda Pal, DO   Assessment & Plan: Visit Diagnoses:  1. Chronic pain of left knee   2. Chronic pain of right knee   3. Unilateral primary osteoarthritis, left knee   4. Unilateral primary osteoarthritis, right knee     Plan:   I went over the patient's x-rays with him in detail today.  We are going to try conservative treatment first with steroid injections in his knees today.  He is interested in trying hyaluronic acid as well.  He does state that his wife had a knee replacement last year and did well.  He understands that that is likely in his future given the severity of his arthritis.  All questions and concerns were answered and addressed.  He tolerated the steroid injections well both knees today.  We will order hyaluronic acid for his knees.  I did give him a copy of his x-rays.  Follow-Up Instructions: Return in about 4 weeks (around 10/22/2020).   Orders:  Orders Placed This Encounter  Procedures  . Large Joint Inj  . Large Joint Inj  . XR Knee 1-2 Views Right  . XR Knee 1-2 Views Left   No orders of the defined types were placed in this encounter.     Procedures: Large Joint Inj: R knee on 09/24/2020 10:25 AM Indications: diagnostic evaluation and pain Details: 22 G 1.5 in needle, superolateral approach  Arthrogram: No  Medications: 3 mL lidocaine 1 %; 40 mg methylPREDNISolone acetate 40 MG/ML Outcome: tolerated well, no immediate complications Procedure, treatment alternatives, risks and benefits explained, specific risks discussed. Consent was given by the patient. Immediately prior to procedure a time out was called to verify the correct patient, procedure, equipment, support staff and site/side marked as  required. Patient was prepped and draped in the usual sterile fashion.   Large Joint Inj: L knee on 09/24/2020 10:26 AM Indications: diagnostic evaluation and pain Details: 22 G 1.5 in needle, superolateral approach  Arthrogram: No  Medications: 3 mL lidocaine 1 %; 40 mg methylPREDNISolone acetate 40 MG/ML Outcome: tolerated well, no immediate complications Procedure, treatment alternatives, risks and benefits explained, specific risks discussed. Consent was given by the patient. Immediately prior to procedure a time out was called to verify the correct patient, procedure, equipment, support staff and site/side marked as required. Patient was prepped and draped in the usual sterile fashion.       Clinical Data: No additional findings.   Subjective: Chief Complaint  Patient presents with  . Right Knee - Pain  . Left Knee - Pain  The patient comes in today for evaluation treatment of chronic knee pain.  He says both knees have been hurting him for about 45 years now.  He points the medial aspect of both knees as a source of his pain.  He actually had remote surgery when he was in his 75s on his left knee when he was in [place].  He was playing football and injured that knee.  He had had surgery at age 37 on his right knee.  There is no hardware in either  knee.  Both knees hurt on the medial joint line and laterally.  He has more problems going up and down stairs and keeping up with grandkids in general.  At this point his knee pain is definitely affecting his mobility, his quality of life and his actives daily living.  He is not a diabetic and not a smoker.  He is not on blood thinning medications.  HPI  Review of Systems He currently denies any headache, chest pain, shortness of breath, fever, chills, nausea, vomiting  Objective: Vital Signs: There were no vitals taken for this visit.  Physical Exam He is alert and orient x3 and in no acute distress Ortho Exam examination of both  knees shows varus malalignment of both knees.  Both knees have slight flexion contractures with significant patellofemoral crepitation.  Both knees have good range of motion and her ligaments are stable but painful throughout the arc of motion and globally tender. Specialty Comments:  No specialty comments available.  Imaging: XR Knee 1-2 Views Left  Result Date: 09/24/2020 2 views of the left knee show complete end-stage arthritis of all 3 compartments with significant joint space narrowing of all 3 compartments.  There are sclerotic changes and large osteophytes as well as loose bodies around the knee.  XR Knee 1-2 Views Right  Result Date: 09/24/2020 2 views of the right knee show severe osteoarthritis with varus malalignment, significant loss of cartilage in the medial joint space and complete loss of the joint space.  There is large para-articular osteophytes in all 3 compartments.    PMFS History: Patient Active Problem List   Diagnosis Date Noted  . Unilateral primary osteoarthritis, left knee 09/24/2020  . Unilateral primary osteoarthritis, right knee 09/24/2020  . Left lateral abdominal pain 12/21/2018  . URI (upper respiratory infection) 11/17/2018  . OSA on CPAP   . Coronary artery disease   . GERD (gastroesophageal reflux disease)   . Headache(784.0)   . Syncope 05/21/2012  . CAD (coronary artery disease) 05/21/2012   Past Medical History:  Diagnosis Date  . Coronary artery disease   . GERD (gastroesophageal reflux disease)   . Headache(784.0)   . Migraine   . OSA on CPAP     Family History  Problem Relation Age of Onset  . Hypertension Mother   . Cancer Mother        multiple meyloma  . Cancer Father        stomach cancer  . Diabetes Father   . Cancer Brother        throat    Past Surgical History:  Procedure Laterality Date  . CORONARY ANGIOPLASTY WITH STENT PLACEMENT  2000  . KNEE ARTHROSCOPY  1963   on Right Knee with removal of pieces of  shattered bone  . KNEE ARTHROSCOPY W/ ACL RECONSTRUCTION  1973   Left knee  . OTHER SURGICAL HISTORY  2011   to lower organs on left side that were pressing on diaphragm   Social History   Occupational History  . Not on file  Tobacco Use  . Smoking status: Never Smoker  . Smokeless tobacco: Never Used  Substance and Sexual Activity  . Alcohol use: Yes    Comment: occasionaly   . Drug use: No  . Sexual activity: Yes

## 2020-09-24 NOTE — Telephone Encounter (Signed)
Patient aware of the below message  

## 2020-10-06 ENCOUNTER — Telehealth: Payer: Self-pay

## 2020-10-06 NOTE — Telephone Encounter (Signed)
Submitted VOB, Monovisc, bilateral knee. 

## 2020-10-15 ENCOUNTER — Encounter: Payer: Self-pay | Admitting: Family Medicine

## 2020-10-15 ENCOUNTER — Other Ambulatory Visit: Payer: Self-pay

## 2020-10-15 ENCOUNTER — Ambulatory Visit (INDEPENDENT_AMBULATORY_CARE_PROVIDER_SITE_OTHER): Payer: Medicare Other | Admitting: Family Medicine

## 2020-10-15 VITALS — BP 108/68 | HR 54 | Temp 98.5°F | Ht 72.0 in | Wt 256.2 lb

## 2020-10-15 DIAGNOSIS — E860 Dehydration: Secondary | ICD-10-CM

## 2020-10-15 DIAGNOSIS — R2689 Other abnormalities of gait and mobility: Secondary | ICD-10-CM

## 2020-10-15 DIAGNOSIS — Z9119 Patient's noncompliance with other medical treatment and regimen: Secondary | ICD-10-CM

## 2020-10-15 DIAGNOSIS — I2510 Atherosclerotic heart disease of native coronary artery without angina pectoris: Secondary | ICD-10-CM | POA: Diagnosis not present

## 2020-10-15 DIAGNOSIS — Z91199 Patient's noncompliance with other medical treatment and regimen due to unspecified reason: Secondary | ICD-10-CM | POA: Insufficient documentation

## 2020-10-15 MED ORDER — ATORVASTATIN CALCIUM 40 MG PO TABS: 40.0000 mg | ORAL_TABLET | Freq: Every day | ORAL | 3 refills | Status: DC

## 2020-10-15 NOTE — Progress Notes (Signed)
Chief Complaint  Patient presents with  . balance problems    Kurt Williams is 69 y.o. pt here for balance issues.  Duration: 4 days Started veering to the R when he was walking in the bathroom that lasted around 20 s; this AM he reached forward and felt off balance that lasted 10-15 seconds. Pass out? No Spinning? No Recent illness/fever? No Headache? No Neurologic signs? No Change in PO intake? Yes- less water intake over 2 weeks; overall eating normal the past couple weeks  Past Medical History:  Diagnosis Date  . Coronary artery disease   . GERD (gastroesophageal reflux disease)   . Headache(784.0)   . Migraine   . OSA on CPAP     Family History  Problem Relation Age of Onset  . Hypertension Mother   . Cancer Mother        multiple meyloma  . Cancer Father        stomach cancer  . Diabetes Father   . Cancer Brother        throat    Allergies as of 10/15/2020   No Known Allergies     Medication List       Accurate as of October 15, 2020  4:17 PM. If you have any questions, ask your nurse or doctor.        aspirin 81 MG chewable tablet Chew 81 mg by mouth daily.   ASTAXANTHIN PO Take 12 mg by mouth daily.   atorvastatin 40 MG tablet Commonly known as: LIPITOR Take 1 tablet (40 mg total) by mouth daily.   Cod Liver Oil Caps Take 1 capsule by mouth daily.   ELDERBERRY PO Take by mouth.   EPINEPHrine 0.3 mg/0.3 mL Soaj injection Commonly known as: EPI-PEN Inject 0.3 mLs (0.3 mg total) into the muscle as needed for anaphylaxis.   Garlic 10 MG Caps Take by mouth.   metoprolol tartrate 50 MG tablet Commonly known as: LOPRESSOR Take one tablet by mouth 2 hours prior to CT   nitroGLYCERIN 0.4 MG SL tablet Commonly known as: Nitrostat Place 1 tablet (0.4 mg total) under the tongue every 5 (five) minutes as needed for chest pain.   ondansetron 4 MG disintegrating tablet Commonly known as: ZOFRAN-ODT Take 1 tablet (4 mg total) by mouth every 8  (eight) hours as needed for nausea or vomiting.   OVER THE COUNTER MEDICATION Take 1 tablet by mouth daily as needed. Allergy medication OTC   sildenafil 20 MG tablet Commonly known as: REVATIO Take 2-3 tabs as needed for erectile dysfunction.   vitamin D3 50 MCG (2000 UT) Caps Take 2,000 Units by mouth daily.   Zinc 15 MG Caps Take by mouth.       BP 108/68 (BP Location: Left Arm, Patient Position: Sitting, Cuff Size: Large)   Pulse (!) 54   Temp 98.5 F (36.9 C) (Oral)   Ht 6' (1.829 m)   Wt 256 lb 4 oz (116.2 kg)   SpO2 98%   BMI 34.75 kg/m  General: Awake, alert, appears stated age Eyes: PERRLA, EOMi Ears: Patent, TM's neg b/l Heart: reg rhythm, bardycardic, no murmurs, no carotid bruits Lungs: CTAB, no accessory muscle use MSK: 5/5 strength throughout, gait normal Neuro: No cerebellar signs, patellar reflex 0/4 b/l wo clonus, calcaneal reflex 0/4 b/l wo clonus, biceps reflex 0/4 b/l wo clonus; Dix-Hall-Pike negative b/l. Psych: Age appropriate judgment and insight, normal mood and affect  Coronary artery disease involving native heart without angina pectoris,  unspecified vessel or lesion type - Plan: atorvastatin (LIPITOR) 40 MG tablet  Balance problem  Dehydration  Noncompliance  1. Pt has not been taking his Lipitor or ASA. He states he will start now. I have counseled him on the importance of taking this in the past. He states he would like it sent in again and will start taking. 2. Likely related to #3 most recently. I question TIA for his initial episode. Offered MRI, though with no residual issues, would just want him to take statin and ASA.  3. Go back to at least 64 oz of water daily.  4. Start taking medication.  F/u prn. Pt voiced understanding and agreement to the plan.  [place], DO 10/15/20 4:17 PM

## 2020-10-15 NOTE — Patient Instructions (Signed)
Drink 64 oz water daily.  Take your Lipitor and aspirin daily. This is very important.  If you have another episode like you did at the bathroom, please let me know.   Let us know if you need anything.

## 2020-10-21 ENCOUNTER — Telehealth: Payer: Self-pay

## 2020-10-21 NOTE — Telephone Encounter (Signed)
Approved, Monovisc, bilateral knee. [place] deductible has been met Secondary insurance ([employer]) will pick up remaining eligible expenses at 100% No Co-pay No PA required  Appt. 10/22/2020 with Dr. Ninfa Linden

## 2020-10-22 ENCOUNTER — Encounter: Payer: Self-pay | Admitting: Orthopaedic Surgery

## 2020-10-22 ENCOUNTER — Ambulatory Visit (INDEPENDENT_AMBULATORY_CARE_PROVIDER_SITE_OTHER): Payer: Medicare Other | Admitting: Orthopaedic Surgery

## 2020-10-22 DIAGNOSIS — M1711 Unilateral primary osteoarthritis, right knee: Secondary | ICD-10-CM

## 2020-10-22 DIAGNOSIS — M1712 Unilateral primary osteoarthritis, left knee: Secondary | ICD-10-CM

## 2020-10-22 MED ORDER — HYALURONAN 88 MG/4ML IX SOSY
88.0000 mg | PREFILLED_SYRINGE | INTRA_ARTICULAR | Status: AC | PRN
  Administered 2020-10-22: 88 mg via INTRA_ARTICULAR

## 2020-10-22 NOTE — Progress Notes (Signed)
   Procedure Note  Patient: Kurt Williams             Date of Birth: 03-10-1951           MRN: [id number]             Visit Date: 10/22/2020 HPI: Mr. Baller returns today for scheduled Monovisc injections both knees.  He states that the cortisone injections have given him some relief with the pain he is having in both knees.  Said no new injuries.  Physical exam: Bilateral knees good range of motion no abnormal warmth erythema or effusion. Procedures: Visit Diagnoses:  1. Unilateral primary osteoarthritis, left knee   2. Unilateral primary osteoarthritis, right knee     Large Joint Inj: bilateral knee on 10/22/2020 10:16 AM Indications: pain Details: 22 G 1.5 in needle, superolateral approach  Arthrogram: No  Medications (Right): 88 mg Hyaluronan 88 MG/4ML Medications (Left): 88 mg Hyaluronan 88 MG/4ML Outcome: tolerated well, no immediate complications Procedure, treatment alternatives, risks and benefits explained, specific risks discussed. Consent was given by the patient. Immediately prior to procedure a time out was called to verify the correct patient, procedure, equipment, support staff and site/side marked as required. Patient was prepped and draped in the usual sterile fashion.     Plan: He will follow up with [place] as needed.  He understands to wait 3 months between cortisone injections and 6 months between supplemental injections.  Questions were encouraged and answered.

## 2020-12-30 ENCOUNTER — Ambulatory Visit: Payer: Self-pay | Admitting: *Deleted

## 2021-01-07 NOTE — Progress Notes (Signed)
Subjective:   Kurt Williams is a 70 y.o. male who presents for Medicare Annual/Subsequent preventive examination.  Review of Systems     Cardiac Risk Factors include: advanced age (>42men, >75 women);dyslipidemia;obesity (BMI >30kg/m2)     Objective:    Today's Vitals   01/08/21 1548  BP: (!) 146/78  Pulse: (!) 51  Resp: 16  Temp: 98 F (36.7 C)  TempSrc: Oral  SpO2: 97%  Weight: 255 lb (115.7 kg)  Height: 6' (1.829 m)   Body mass index is 34.58 kg/m.  Advanced Directives 01/08/2021 12/28/2019 12/21/2018 01/13/2015 05/21/2012  Does Patient Have a Medical Advance Directive? No No No No Patient does not have advance directive  Would patient like information on creating a medical advance directive? No - Patient declined No - Patient declined Yes (MAU/Ambulatory/Procedural Areas - Information given) - -    Current Medications (verified) Outpatient Encounter Medications as of 01/08/2021  Medication Sig   aspirin 81 MG chewable tablet Chew 81 mg by mouth daily.   ASTAXANTHIN PO Take 12 mg by mouth daily.   Black Elderberry,Berry-Flower, 575 MG CAPS Take by mouth.   Cod Liver Oil CAPS Take 1 capsule by mouth daily.   ELDERBERRY PO Take by mouth.   EPINEPHrine 0.3 mg/0.3 mL IJ SOAJ injection Inject 0.3 mLs (0.3 mg total) into the muscle as needed for anaphylaxis.   Garlic 10 MG CAPS Take by mouth.   metoprolol tartrate (LOPRESSOR) 50 MG tablet Take one tablet by mouth 2 hours prior to CT   nitroGLYCERIN (NITROSTAT) 0.4 MG SL tablet Place 1 tablet (0.4 mg total) under the tongue every 5 (five) minutes as needed for chest pain.   OVER THE COUNTER MEDICATION Take 1 tablet by mouth daily as needed. Allergy medication OTC   sildenafil (REVATIO) 20 MG tablet Take 2-3 tabs as needed for erectile dysfunction.   Vitamin D, Cholecalciferol, 50 MCG (2000 UT) CAPS Take 2,000 Units by mouth daily.   Zinc 15 MG CAPS Take by mouth.   atorvastatin (LIPITOR) 40 MG tablet Take 1 tablet  (40 mg total) by mouth daily. (Patient not taking: Reported on 01/08/2021)   ondansetron (ZOFRAN-ODT) 4 MG disintegrating tablet Take 1 tablet (4 mg total) by mouth every 8 (eight) hours as needed for nausea or vomiting. (Patient not taking: No sig reported)   No facility-administered encounter medications on file as of 01/08/2021.    Allergies (verified) Patient has no known allergies.   History: Past Medical History:  Diagnosis Date   Coronary artery disease    GERD (gastroesophageal reflux disease)    Headache(784.0)    Migraine    OSA on CPAP    Past Surgical History:  Procedure Laterality Date   CORONARY ANGIOPLASTY WITH STENT PLACEMENT  2000   KNEE ARTHROSCOPY  1963   on Right Knee with removal of pieces of shattered bone   KNEE ARTHROSCOPY W/ ACL RECONSTRUCTION  1973   Left knee   OTHER SURGICAL HISTORY  2011   to lower organs on left side that were pressing on diaphragm   Family History  Problem Relation Age of Onset   Hypertension Mother    Cancer Mother        multiple meyloma   Cancer Father        stomach cancer   Diabetes Father    Cancer Brother        throat   Social History   Socioeconomic History   Marital status: Married  Spouse name: Not on file   Number of children: Not on file   Years of education: Not on file   Highest education level: Not on file  Occupational History   Not on file  Tobacco Use   Smoking status: Never Smoker   Smokeless tobacco: Never Used  Substance and Sexual Activity   Alcohol use: Yes    Comment: occasionaly    Drug use: No   Sexual activity: Yes  Other Topics Concern   Not on file  Social History Narrative   Not on file   Social Determinants of Health   Financial Resource Strain: Low Risk    Difficulty of Paying Living Expenses: Not hard at all  Food Insecurity: No Food Insecurity   Worried About Programme researcher, broadcasting/film/video in the Last Year: Never true   Ran Out of Food in the Last  Year: Never true  Transportation Needs: No Transportation Needs   Lack of Transportation (Medical): No   Lack of Transportation (Non-Medical): No  Physical Activity: Insufficiently Active   Days of Exercise per Week: 4 days   Minutes of Exercise per Session: 30 min  Stress: No Stress Concern Present   Feeling of Stress : Not at all  Social Connections: Socially Integrated   Frequency of Communication with Friends and Family: More than three times a week   Frequency of Social Gatherings with Friends and Family: More than three times a week   Attends Religious Services: More than 4 times per year   Active Member of [employer] or Organizations: Yes   Attends Engineer, structural: More than 4 times per year   Marital Status: Married    Tobacco Counseling Counseling given: Not Answered   Clinical Intake:  Pre-visit preparation completed: Yes  Pain : No/denies pain     Nutritional Status: BMI > 30  Obese Nutritional Risks: None Diabetes: No  How often do you need to have someone help you when you read instructions, pamphlets, or other written materials from your doctor or pharmacy?: 1 - Never  Diabetic?No  Interpreter Needed?: No  Information entered by :: Thomasenia Sales LPN   Activities of Daily Living In your present state of health, do you have any difficulty performing the following activities: 01/08/2021  Hearing? N  Vision? N  Difficulty concentrating or making decisions? N  Walking or climbing stairs? N  Dressing or bathing? N  Doing errands, shopping? N  Preparing Food and eating ? N  Using the Toilet? N  In the past six months, have you accidently leaked urine? N  Do you have problems with loss of bowel control? N  Managing your Medications? N  Managing your Finances? N  Housekeeping or managing your Housekeeping? N  Some recent data might be hidden    Patient Care Team: Sharlene Dory, DO as PCP - General (Family  Medicine) Hilda Lias, MD as Consulting Physician (Neurosurgery) Lyn Records, MD as Consulting Physician (Cardiology)  Indicate any recent Medical Services you may have received from other than [facility] providers in the past year (date may be approximate).     Assessment:   This is a routine wellness examination for Kurt Williams.  Hearing/Vision screen  Hearing Screening   125Hz  250Hz  500Hz  1000Hz  2000Hz  3000Hz  4000Hz  6000Hz  8000Hz   Right ear:           Left ear:           Comments: States he is suppose to wear a hearing aid  Vision Screening Comments: Last eye exam-several years   Dietary issues and exercise activities discussed: Current Exercise Habits: Home exercise routine, Type of exercise: strength training/weights, Time (Minutes): 30, Frequency (Times/Week): 4, Weekly Exercise (Minutes/Week): 120, Intensity: Mild, Exercise limited by: None identified  Goals     Increase physical activity     Patient Stated     Continue low carb diet      Depression Screen PHQ 2/9 Scores 01/08/2021 12/28/2019 12/21/2018  PHQ - 2 Score 1 0 0    Fall Risk Fall Risk  01/08/2021 12/28/2019 12/21/2018  Falls in the past year? 0 0 1  Number falls in past yr: 0 0 0  Injury with Fall? 0 1 0  Follow up Falls prevention discussed Education provided;Falls prevention discussed -    FALL RISK PREVENTION PERTAINING TO THE HOME:  Any stairs in or around the home? No  Home free of loose throw rugs in walkways, pet beds, electrical cords, etc? Yes  Adequate lighting in your home to reduce risk of falls? Yes   ASSISTIVE DEVICES UTILIZED TO PREVENT FALLS:  Life alert? No  Use of a cane, walker or w/c? No  Grab bars in the bathroom? No  Shower chair or bench in shower? No  Elevated toilet seat or a handicapped toilet? No   TIMED UP AND GO:  Was the test performed? Yes .  Length of time to ambulate 10 feet: 10 sec.   Gait steady and fast without use of assistive device  Cognitive Function:Normal  cognitive status assessed by direct observation by this Nurse Health Advisor. No abnormalities found.   MMSE - Mini Mental State Exam 12/21/2018  Orientation to time 5  Orientation to Place 5  Registration 3  Attention/ Calculation 5  Recall 3  Language- name 2 objects 2  Language- repeat 1  Language- follow 3 step command 3  Language- read & follow direction 1  Write a sentence 1  Copy design 1  Total score 30        Immunizations Immunization History  Administered Date(s) Administered   Tdap 12/21/2018    TDAP status: Up to date  Flu Vaccine status: Declined, Education has been provided regarding the importance of this vaccine but patient still declined. Advised may receive this vaccine at local pharmacy or Health Dept. Aware to provide a copy of the vaccination record if obtained from local pharmacy or Health Dept. Verbalized acceptance and understanding.  Pneumococcal vaccine status: Declined,  Education has been provided regarding the importance of this vaccine but patient still declined. Advised may receive this vaccine at local pharmacy or Health Dept. Aware to provide a copy of the vaccination record if obtained from local pharmacy or Health Dept. Verbalized acceptance and understanding.   Covid-19 vaccine status: Declined, Education has been provided regarding the importance of this vaccine but patient still declined. Advised may receive this vaccine at local pharmacy or Health Dept.or vaccine clinic. Aware to provide a copy of the vaccination record if obtained from local pharmacy or Health Dept. Verbalized acceptance and understanding.  Qualifies for Shingles Vaccine? Yes   Zostavax completed No   Shingrix Completed?: No.    Education has been provided regarding the importance of this vaccine. Patient has been advised to call insurance company to determine out of pocket expense if they have not yet received this vaccine. Advised may also receive vaccine at local pharmacy  or Health Dept. Verbalized acceptance and understanding.  Screening Tests Health Maintenance  Topic  Date Due   COVID-19 Vaccine (1) Never done   COLONOSCOPY (Pts 45-75yrs Insurance coverage will need to be confirmed)  03/27/2025   TETANUS/TDAP  12/21/2028   Hepatitis C Screening  Completed   PNA vac Low Risk Adult  Completed   INFLUENZA VACCINE  Discontinued    Health Maintenance  Health Maintenance Due  Topic Date Due   COVID-19 Vaccine (1) Never done    Colorectal cancer screening: Type of screening: Colonoscopy. Completed 03/28/2015. Repeat every 10 years  Lung Cancer Screening: (Low Dose CT Chest recommended if Age 76-80 years, 30 pack-year currently smoking OR have quit w/in 15years.) does not qualif  Additional Screening:  Hepatitis C Screening: Completed 12/21/2018  Vision Screening: Recommended annual ophthalmology exams for early detection of glaucoma and other disorders of the eye. Is the patient up to date with their annual eye exam?  No  Who is the provider or what is the name of the office in which the patient attends annual eye exams? Unsure of name If pt is not established with a provider, would they like to be referred to a provider to establish care? No .   Dental Screening: Recommended annual dental exams for proper oral hygiene  Community Resource Referral / Chronic Care Management: CRR required this visit?  No   CCM required this visit?  No      Plan:     I have personally reviewed and noted the following in the patients chart:    Medical and social history  Use of alcohol, tobacco or illicit drugs   Current medications and supplements  Functional ability and status  Nutritional status  Physical activity  Advanced directives  List of other physicians  Hospitalizations, surgeries, and ER visits in previous 12 months  Vitals  Screenings to include cognitive, depression, and falls  Referrals and appointments  In addition, I  have reviewed and discussed with patient certain preventive protocols, quality metrics, and best practice recommendations. A written personalized care plan for preventive services as well as general preventive health recommendations were provided to patient.     Roanna Raider, LPN   [id number]  Nurse Health Advisor  Nurse Notes: Patient states he has been having some dizizness when standing off & on x several months.. Appt made with PCP for tomorrow at 1:30.

## 2021-01-08 ENCOUNTER — Other Ambulatory Visit: Payer: Self-pay

## 2021-01-08 ENCOUNTER — Ambulatory Visit (INDEPENDENT_AMBULATORY_CARE_PROVIDER_SITE_OTHER): Payer: Medicare Other

## 2021-01-08 VITALS — BP 146/78 | HR 51 | Temp 98.0°F | Resp 16 | Ht 72.0 in | Wt 255.0 lb

## 2021-01-08 DIAGNOSIS — Z0000 Encounter for general adult medical examination without abnormal findings: Secondary | ICD-10-CM | POA: Diagnosis not present

## 2021-01-08 NOTE — Patient Instructions (Signed)
Kurt Williams , Thank you for taking time to come for your Medicare Wellness Visit. I appreciate your ongoing commitment to your health goals. Please review the following plan we discussed and let me know if I can assist you in the future.   Screening recommendations/referrals: Colonoscopy: Completed 03/28/2015-Due 03/27/2025 Recommended yearly ophthalmology/optometry visit for glaucoma screening and checkup Recommended yearly dental visit for hygiene and checkup  Vaccinations: Influenza vaccine:Declined Pneumococcal vaccine: Declined Tdap vaccine: Up to date-Due-12/21/2028 Shingles vaccine: Declined Covid-19:Declined  Advanced directives: Declined information  Conditions/risks identified: See problem list  Next appointment: Follow up in one year for your annual wellness visit. 01/14/2022 @ 3:20  Preventive Care 65 Years and Older, Male Preventive care refers to lifestyle choices and visits with your health care provider that can promote health and wellness. What does preventive care include?  A yearly physical exam. This is also called an annual well check.  Dental exams once or twice a year.  Routine eye exams. Ask your health care provider how often you should have your eyes checked.  Personal lifestyle choices, including:  Daily care of your teeth and gums.  Regular physical activity.  Eating a healthy diet.  Avoiding tobacco and drug use.  Limiting alcohol use.  Practicing safe sex.  Taking low doses of aspirin every day.  Taking vitamin and mineral supplements as recommended by your health care provider. What happens during an annual well check? The services and screenings done by your health care provider during your annual well check will depend on your age, overall health, lifestyle risk factors, and family history of disease. Counseling  Your health care provider may ask you questions about your:  Alcohol use.  Tobacco use.  Drug use.  Emotional  well-being.  Home and relationship well-being.  Sexual activity.  Eating habits.  History of falls.  Memory and ability to understand (cognition).  Work and work Statistician. Screening  You may have the following tests or measurements:  Height, weight, and BMI.  Blood pressure.  Lipid and cholesterol levels. These may be checked every 5 years, or more frequently if you are over 41 years old.  Skin check.  Lung cancer screening. You may have this screening every year starting at age 60 if you have a 30-pack-year history of smoking and currently smoke or have quit within the past 15 years.  Fecal occult blood test (FOBT) of the stool. You may have this test every year starting at age 72.  Flexible sigmoidoscopy or colonoscopy. You may have a sigmoidoscopy every 5 years or a colonoscopy every 10 years starting at age 33.  Prostate cancer screening. Recommendations will vary depending on your family history and other risks.  Hepatitis C blood test.  Hepatitis B blood test.  Sexually transmitted disease (STD) testing.  Diabetes screening. This is done by checking your blood sugar (glucose) after you have not eaten for a while (fasting). You may have this done every 1-3 years.  Abdominal aortic aneurysm (AAA) screening. You may need this if you are a current or former smoker.  Osteoporosis. You may be screened starting at age 72 if you are at high risk. Talk with your health care provider about your test results, treatment options, and if necessary, the need for more tests. Vaccines  Your health care provider may recommend certain vaccines, such as:  Influenza vaccine. This is recommended every year.  Tetanus, diphtheria, and acellular pertussis (Tdap, Td) vaccine. You may need a Td booster every 10 years.  Zoster vaccine. You may need this after age 67.  Pneumococcal 13-valent conjugate (PCV13) vaccine. One dose is recommended after age 36.  Pneumococcal  polysaccharide (PPSV23) vaccine. One dose is recommended after age 70. Talk to your health care provider about which screenings and vaccines you need and how often you need them. This information is not intended to replace advice given to you by your health care provider. Make sure you discuss any questions you have with your health care provider. Document Released: 12/26/2015 Document Revised: 08/18/2016 Document Reviewed: 09/30/2015 Elsevier Interactive Patient Education  2017 [place] Prevention in the Home Falls can cause injuries. They can happen to people of all ages. There are many things you can do to make your home safe and to help prevent falls. What can I do on the outside of my home?  Regularly fix the edges of walkways and driveways and fix any cracks.  Remove anything that might make you trip as you walk through a door, such as a raised step or threshold.  Trim any bushes or trees on the path to your home.  Use bright outdoor lighting.  Clear any walking paths of anything that might make someone trip, such as rocks or tools.  Regularly check to see if handrails are loose or broken. Make sure that both sides of any steps have handrails.  Any raised decks and porches should have guardrails on the edges.  Have any leaves, snow, or ice cleared regularly.  Use sand or salt on walking paths during winter.  Clean up any spills in your garage right away. This includes oil or grease spills. What can I do in the bathroom?  Use night lights.  Install grab bars by the toilet and in the tub and shower. Do not use towel bars as grab bars.  Use non-skid mats or decals in the tub or shower.  If you need to sit down in the shower, use a plastic, non-slip stool.  Keep the floor dry. Clean up any water that spills on the floor as soon as it happens.  Remove soap buildup in the tub or shower regularly.  Attach bath mats securely with double-sided non-slip rug  tape.  Do not have throw rugs and other things on the floor that can make you trip. What can I do in the bedroom?  Use night lights.  Make sure that you have a light by your bed that is easy to reach.  Do not use any sheets or blankets that are too big for your bed. They should not hang down onto the floor.  Have a firm chair that has side arms. You can use this for support while you get dressed.  Do not have throw rugs and other things on the floor that can make you trip. What can I do in the kitchen?  Clean up any spills right away.  Avoid walking on wet floors.  Keep items that you use a lot in easy-to-reach places.  If you need to reach something above you, use a strong step stool that has a grab bar.  Keep electrical cords out of the way.  Do not use floor polish or wax that makes floors slippery. If you must use wax, use non-skid floor wax.  Do not have throw rugs and other things on the floor that can make you trip. What can I do with my stairs?  Do not leave any items on the stairs.  Make sure that there are  handrails on both sides of the stairs and use them. Fix handrails that are broken or loose. Make sure that handrails are as long as the stairways.  Check any carpeting to make sure that it is firmly attached to the stairs. Fix any carpet that is loose or worn.  Avoid having throw rugs at the top or bottom of the stairs. If you do have throw rugs, attach them to the floor with carpet tape.  Make sure that you have a light switch at the top of the stairs and the bottom of the stairs. If you do not have them, ask someone to add them for you. What else can I do to help prevent falls?  Wear shoes that:  Do not have high heels.  Have rubber bottoms.  Are comfortable and fit you well.  Are closed at the toe. Do not wear sandals.  If you use a stepladder:  Make sure that it is fully opened. Do not climb a closed stepladder.  Make sure that both sides of the  stepladder are locked into place.  Ask someone to hold it for you, if possible.  Clearly mark and make sure that you can see:  Any grab bars or handrails.  First and last steps.  Where the edge of each step is.  Use tools that help you move around (mobility aids) if they are needed. These include:  Canes.  Walkers.  Scooters.  Crutches.  Turn on the lights when you go into a dark area. Replace any light bulbs as soon as they burn out.  Set up your furniture so you have a clear path. Avoid moving your furniture around.  If any of your floors are uneven, fix them.  If there are any pets around you, be aware of where they are.  Review your medicines with your doctor. Some medicines can make you feel dizzy. This can increase your chance of falling. Ask your doctor what other things that you can do to help prevent falls. This information is not intended to replace advice given to you by your health care provider. Make sure you discuss any questions you have with your health care provider. Document Released: 09/25/2009 Document Revised: 05/06/2016 Document Reviewed: 01/03/2015 Elsevier Interactive Patient Education  2017 [employer].

## 2021-01-09 ENCOUNTER — Ambulatory Visit (INDEPENDENT_AMBULATORY_CARE_PROVIDER_SITE_OTHER): Payer: Medicare Other | Admitting: Family Medicine

## 2021-01-09 ENCOUNTER — Other Ambulatory Visit: Payer: Self-pay

## 2021-01-09 ENCOUNTER — Encounter: Payer: Self-pay | Admitting: Family Medicine

## 2021-01-09 VITALS — Resp 17 | Ht 72.0 in | Wt 253.0 lb

## 2021-01-09 DIAGNOSIS — R42 Dizziness and giddiness: Secondary | ICD-10-CM | POA: Diagnosis not present

## 2021-01-09 DIAGNOSIS — G720 Drug-induced myopathy: Secondary | ICD-10-CM

## 2021-01-09 DIAGNOSIS — T466X5A Adverse effect of antihyperlipidemic and antiarteriosclerotic drugs, initial encounter: Secondary | ICD-10-CM | POA: Diagnosis not present

## 2021-01-09 MED ORDER — ROSUVASTATIN CALCIUM 5 MG PO TABS: 5.0000 mg | ORAL_TABLET | Freq: Every day | ORAL | 3 refills | Status: AC

## 2021-01-09 NOTE — Patient Instructions (Addendum)
Stay hydrated. Stay active. Get up with caution.   We are changing the Lipitor to a different medication. Wait 2 full weeks before starting the new cholesterol medication.  Consider CoQ10, an over the counter supplement,   Keep the diet clean and stay active.  You have skin tags in your armpit.   Let us know if you need anything.

## 2021-01-09 NOTE — Progress Notes (Signed)
Chief Complaint  Patient presents with  . Dizziness    Worse when standing    Subjective: Patient is a 70 y.o. male here for lightheadedness.  Continues to be an issue, originally started 3 mo ago. He stays well hydrated. Not on any new meds. No Cp or SOB. He has not passed out since being evaluated by his cardiologist.   Aches and runny nose when on the Lipitor. He stopped taking it 5 d ago and the s/s's resolved.   Past Medical History:  Diagnosis Date  . Coronary artery disease   . GERD (gastroesophageal reflux disease)   . Headache(784.0)   . Migraine   . OSA on CPAP     Objective: 122/68  Resp 17   Ht 6' (1.829 m)   Wt 253 lb (114.8 kg)   SpO2 97%   BMI 34.31 kg/m  General: Awake, appears stated age HEENT: MMM, EOMi, ears neg b/l Heart: RRR, no murmurs, bruits, LE edema Lungs: CTAB, no rales, wheezes or rhonchi. No accessory muscle use Psych: Age appropriate judgment and insight, normal affect and mood  Assessment and Plan: Statin myopathy - Plan: rosuvastatin (CRESTOR) 5 MG tablet  Lightheadedness  1. Change Lipitor to Crestor. Hold for 2 weeks. Consider CoQ10. Stay hydrated. 2. Offered midodrine vs cardio follow up. Elected latter.  The patient voiced understanding and agreement to the plan.  [place], DO 01/09/21  1:55 PM

## 2021-01-26 DIAGNOSIS — R059 Cough, unspecified: Secondary | ICD-10-CM | POA: Diagnosis not present

## 2021-01-26 DIAGNOSIS — U071 COVID-19: Secondary | ICD-10-CM | POA: Diagnosis not present

## 2021-01-28 ENCOUNTER — Inpatient Hospital Stay (HOSPITAL_BASED_OUTPATIENT_CLINIC_OR_DEPARTMENT_OTHER)
Admission: EM | Admit: 2021-01-28 | Discharge: 2021-03-13 | DRG: 177 | Disposition: E | Payer: Medicare Other | Attending: Pulmonary Disease | Admitting: Pulmonary Disease

## 2021-01-28 ENCOUNTER — Telehealth (INDEPENDENT_AMBULATORY_CARE_PROVIDER_SITE_OTHER): Payer: Medicare Other | Admitting: Internal Medicine

## 2021-01-28 ENCOUNTER — Encounter: Payer: Self-pay | Admitting: Internal Medicine

## 2021-01-28 ENCOUNTER — Encounter (HOSPITAL_BASED_OUTPATIENT_CLINIC_OR_DEPARTMENT_OTHER): Payer: Self-pay | Admitting: *Deleted

## 2021-01-28 ENCOUNTER — Other Ambulatory Visit: Payer: Self-pay

## 2021-01-28 ENCOUNTER — Emergency Department (HOSPITAL_BASED_OUTPATIENT_CLINIC_OR_DEPARTMENT_OTHER): Payer: Medicare Other

## 2021-01-28 VITALS — Temp 99.4°F | Ht 72.0 in | Wt 240.0 lb

## 2021-01-28 DIAGNOSIS — I2510 Atherosclerotic heart disease of native coronary artery without angina pectoris: Secondary | ICD-10-CM | POA: Diagnosis present

## 2021-01-28 DIAGNOSIS — Z955 Presence of coronary angioplasty implant and graft: Secondary | ICD-10-CM | POA: Diagnosis not present

## 2021-01-28 DIAGNOSIS — U071 COVID-19: Secondary | ICD-10-CM | POA: Diagnosis not present

## 2021-01-28 DIAGNOSIS — R0602 Shortness of breath: Secondary | ICD-10-CM | POA: Diagnosis not present

## 2021-01-28 DIAGNOSIS — Z66 Do not resuscitate: Secondary | ICD-10-CM | POA: Diagnosis not present

## 2021-01-28 DIAGNOSIS — R609 Edema, unspecified: Secondary | ICD-10-CM | POA: Diagnosis present

## 2021-01-28 DIAGNOSIS — E871 Hypo-osmolality and hyponatremia: Secondary | ICD-10-CM | POA: Diagnosis present

## 2021-01-28 DIAGNOSIS — F064 Anxiety disorder due to known physiological condition: Secondary | ICD-10-CM | POA: Diagnosis not present

## 2021-01-28 DIAGNOSIS — J1282 Pneumonia due to coronavirus disease 2019: Secondary | ICD-10-CM | POA: Diagnosis present

## 2021-01-28 DIAGNOSIS — Z515 Encounter for palliative care: Secondary | ICD-10-CM

## 2021-01-28 DIAGNOSIS — R0902 Hypoxemia: Secondary | ICD-10-CM

## 2021-01-28 DIAGNOSIS — J80 Acute respiratory distress syndrome: Secondary | ICD-10-CM | POA: Diagnosis present

## 2021-01-28 DIAGNOSIS — R7989 Other specified abnormal findings of blood chemistry: Secondary | ICD-10-CM | POA: Diagnosis not present

## 2021-01-28 DIAGNOSIS — Z833 Family history of diabetes mellitus: Secondary | ICD-10-CM | POA: Diagnosis not present

## 2021-01-28 DIAGNOSIS — S27399A Other injuries of lung, unspecified, initial encounter: Secondary | ICD-10-CM | POA: Diagnosis not present

## 2021-01-28 DIAGNOSIS — Z91030 Bee allergy status: Secondary | ICD-10-CM

## 2021-01-28 DIAGNOSIS — Z79899 Other long term (current) drug therapy: Secondary | ICD-10-CM | POA: Diagnosis not present

## 2021-01-28 DIAGNOSIS — Z8249 Family history of ischemic heart disease and other diseases of the circulatory system: Secondary | ICD-10-CM | POA: Diagnosis not present

## 2021-01-28 DIAGNOSIS — N179 Acute kidney failure, unspecified: Secondary | ICD-10-CM | POA: Diagnosis not present

## 2021-01-28 DIAGNOSIS — H903 Sensorineural hearing loss, bilateral: Secondary | ICD-10-CM | POA: Diagnosis not present

## 2021-01-28 DIAGNOSIS — N1831 Chronic kidney disease, stage 3a: Secondary | ICD-10-CM | POA: Diagnosis present

## 2021-01-28 DIAGNOSIS — K219 Gastro-esophageal reflux disease without esophagitis: Secondary | ICD-10-CM | POA: Diagnosis not present

## 2021-01-28 DIAGNOSIS — G4733 Obstructive sleep apnea (adult) (pediatric): Secondary | ICD-10-CM | POA: Diagnosis present

## 2021-01-28 DIAGNOSIS — Z283 Underimmunization status: Secondary | ICD-10-CM

## 2021-01-28 DIAGNOSIS — Z800 Family history of malignant neoplasm of digestive organs: Secondary | ICD-10-CM | POA: Diagnosis not present

## 2021-01-28 DIAGNOSIS — J9601 Acute respiratory failure with hypoxia: Secondary | ICD-10-CM | POA: Diagnosis present

## 2021-01-28 DIAGNOSIS — R918 Other nonspecific abnormal finding of lung field: Secondary | ICD-10-CM | POA: Diagnosis not present

## 2021-01-28 DIAGNOSIS — I5031 Acute diastolic (congestive) heart failure: Secondary | ICD-10-CM | POA: Diagnosis not present

## 2021-01-28 DIAGNOSIS — G43909 Migraine, unspecified, not intractable, without status migrainosus: Secondary | ICD-10-CM | POA: Diagnosis not present

## 2021-01-28 DIAGNOSIS — E669 Obesity, unspecified: Secondary | ICD-10-CM | POA: Diagnosis present

## 2021-01-28 DIAGNOSIS — I25118 Atherosclerotic heart disease of native coronary artery with other forms of angina pectoris: Secondary | ICD-10-CM | POA: Diagnosis not present

## 2021-01-28 DIAGNOSIS — Z6832 Body mass index (BMI) 32.0-32.9, adult: Secondary | ICD-10-CM | POA: Diagnosis not present

## 2021-01-28 DIAGNOSIS — Z7982 Long term (current) use of aspirin: Secondary | ICD-10-CM

## 2021-01-28 DIAGNOSIS — Z9989 Dependence on other enabling machines and devices: Secondary | ICD-10-CM

## 2021-01-28 DIAGNOSIS — R059 Cough, unspecified: Secondary | ICD-10-CM | POA: Diagnosis not present

## 2021-01-28 DIAGNOSIS — N183 Chronic kidney disease, stage 3 unspecified: Secondary | ICD-10-CM | POA: Diagnosis present

## 2021-01-28 DIAGNOSIS — E875 Hyperkalemia: Secondary | ICD-10-CM | POA: Diagnosis not present

## 2021-01-28 LAB — COMPREHENSIVE METABOLIC PANEL
ALT: 33 U/L (ref 0–44)
AST: 58 U/L — ABNORMAL HIGH (ref 15–41)
Albumin: 3.7 g/dL (ref 3.5–5.0)
Alkaline Phosphatase: 49 U/L (ref 38–126)
Anion gap: 10 (ref 5–15)
BUN: 14 mg/dL (ref 8–23)
CO2: 23 mmol/L (ref 22–32)
Calcium: 9 mg/dL (ref 8.9–10.3)
Chloride: 99 mmol/L (ref 98–111)
Creatinine, Ser: 1.49 mg/dL — ABNORMAL HIGH (ref 0.61–1.24)
GFR, Estimated: 50 mL/min — ABNORMAL LOW (ref >60)
Glucose, Bld: 149 mg/dL — ABNORMAL HIGH (ref 70–99)
Potassium: 4.2 mmol/L (ref 3.5–5.1)
Sodium: 132 mmol/L — ABNORMAL LOW (ref 135–145)
Total Bilirubin: 0.6 mg/dL (ref 0.3–1.2)
Total Protein: 7.6 g/dL (ref 6.5–8.1)

## 2021-01-28 LAB — CBC WITH DIFFERENTIAL/PLATELET
Abs Immature Granulocytes: 0.01 10*3/uL (ref 0.00–0.07)
Basophils Absolute: 0 10*3/uL (ref 0.0–0.1)
Basophils Relative: 0 %
Eosinophils Absolute: 0 10*3/uL (ref 0.0–0.5)
Eosinophils Relative: 0 %
HCT: 37.8 % — ABNORMAL LOW (ref 39.0–52.0)
Hemoglobin: 12.3 g/dL — ABNORMAL LOW (ref 13.0–17.0)
Immature Granulocytes: 0 %
Lymphocytes Relative: 10 %
Lymphs Abs: 0.5 10*3/uL — ABNORMAL LOW (ref 0.7–4.0)
MCH: 29.9 pg (ref 26.0–34.0)
MCHC: 32.5 g/dL (ref 30.0–36.0)
MCV: 92 fL (ref 80.0–100.0)
Monocytes Absolute: 0.6 10*3/uL (ref 0.1–1.0)
Monocytes Relative: 11 %
Neutro Abs: 4.3 10*3/uL (ref 1.7–7.7)
Neutrophils Relative %: 79 %
Platelets: 114 10*3/uL — ABNORMAL LOW (ref 150–400)
RBC: 4.11 MIL/uL — ABNORMAL LOW (ref 4.22–5.81)
RDW: 13.3 % (ref 11.5–15.5)
Smear Review: DECREASED
WBC: 5.4 10*3/uL (ref 4.0–10.5)
nRBC: 0 % (ref 0.0–0.2)

## 2021-01-28 LAB — LACTIC ACID, PLASMA: Lactic Acid, Venous: 2.3 mmol/L (ref 0.5–1.9)

## 2021-01-28 LAB — D-DIMER, QUANTITATIVE: D-Dimer, Quant: 0.87 ug/mL-FEU — ABNORMAL HIGH (ref 0.00–0.50)

## 2021-01-28 MED ORDER — BARICITINIB 2 MG PO TABS
4.0000 mg | ORAL_TABLET | Freq: Every day | ORAL | Status: DC
  Administered 2021-01-29 (×2): 4 mg via ORAL
  Filled 2021-01-28 (×3): qty 2

## 2021-01-28 MED ORDER — SODIUM CHLORIDE 0.9 % IV SOLN
100.0000 mg | Freq: Once | INTRAVENOUS | Status: AC
  Administered 2021-01-28: 100 mg via INTRAVENOUS

## 2021-01-28 MED ORDER — DEXAMETHASONE SODIUM PHOSPHATE 10 MG/ML IJ SOLN
10.0000 mg | Freq: Once | INTRAMUSCULAR | Status: AC
  Administered 2021-01-28: 10 mg via INTRAVENOUS
  Filled 2021-01-28: qty 1

## 2021-01-28 MED ORDER — ALBUTEROL SULFATE HFA 108 (90 BASE) MCG/ACT IN AERS: 8.0000 | INHALATION_SPRAY | Freq: Once | RESPIRATORY_TRACT | Status: AC

## 2021-01-28 MED ORDER — SODIUM CHLORIDE 0.9 % IV SOLN: 100.0000 mg | Freq: Every day | INTRAVENOUS | Status: DC

## 2021-01-28 MED ORDER — ALBUTEROL SULFATE HFA 108 (90 BASE) MCG/ACT IN AERS
INHALATION_SPRAY | RESPIRATORY_TRACT | Status: AC
  Administered 2021-01-28: 8 via RESPIRATORY_TRACT
  Filled 2021-01-28: qty 6.7

## 2021-01-28 NOTE — ED Triage Notes (Signed)
Pt dx with covid on 01/26/21. Began to have SOB and dizziness on 2/13. Pt reports SOB continues. Pt started on Zpack and prednisone, Vit 3, zinc and D3 yesterday.

## 2021-01-28 NOTE — ED Notes (Signed)
Date and time results received: 01/27/2021 2240  Test: Lactic Acid Critical Value: 2.3  Name of Provider Notified: Evette Cristal, [place]  Orders Received? Or Actions Taken?: ED PA Notified; No further action/orders at this time. RN will continue to monitor.

## 2021-01-28 NOTE — ED Notes (Signed)
#  1 assigned contact Daughter Kurt Williams [phone]

## 2021-01-28 NOTE — ED Notes (Signed)
o2 sat 79, RT to bedside  Pt placed semi prone

## 2021-01-28 NOTE — Progress Notes (Signed)
Subjective:    Patient ID: Kurt Williams, male    DOB: Apr 24, 1951, 70 y.o.   MRN: [id number]  DOS:  01/18/2021 Type of visit - description: Virtual Visit via Video Note  I connected with the above patient  by a video enabled telemedicine application and verified that I am speaking with the correct person using two identifiers.   THIS ENCOUNTER IS A VIRTUAL VISIT DUE TO COVID-19 - PATIENT WAS NOT SEEN IN THE OFFICE. PATIENT HAS CONSENTED TO VIRTUAL VISIT / TELEMEDICINE VISIT   Location of patient: home  Location of provider: office  Persons participating in the virtual visit: patient, provider   I discussed the limitations of evaluation and management by telemedicine and the availability of in person appointments. The patient expressed understanding and agreed to proceed.  Acute Symptoms a started 01/25/2021: Fever up to 100.7, mild cough, weakness, DOE. Usually able to do all his ADLs without problem and now after 20 steps he started getting short of breath.  Went to the [place] clinic the next day, he tested positive, was Rx prednisone and a Z-Pak. Wife also tested positive.  Denies any chest pain, lower extremity edema. No headache No nausea or vomiting Sinuses are minimally congested. He is taking Tylenol and ibuprofen as needed.    Review of Systems See above   Past Medical History:  Diagnosis Date  . Coronary artery disease   . GERD (gastroesophageal reflux disease)   . Headache(784.0)   . Migraine   . OSA on CPAP     Past Surgical History:  Procedure Laterality Date  . CORONARY ANGIOPLASTY WITH STENT PLACEMENT  2000  . KNEE ARTHROSCOPY  1963   on Right Knee with removal of pieces of shattered bone  . KNEE ARTHROSCOPY W/ ACL RECONSTRUCTION  1973   Left knee  . OTHER SURGICAL HISTORY  2011   to lower organs on left side that were pressing on diaphragm    Allergies as of 02/09/2021   No Known Allergies     Medication List       Accurate as of January 28, 2021  4:25 PM. If you have any questions, ask your nurse or doctor.        STOP taking these medications   ELDERBERRY PO Stopped by: Kathlene November, MD     TAKE these medications   ascorbic acid 1000 MG tablet Commonly known as: VITAMIN C Take 2,000 mg by mouth daily.   aspirin 81 MG chewable tablet Chew 81 mg by mouth daily.   ASTAXANTHIN PO Take 12 mg by mouth daily.   azithromycin 250 MG tablet Commonly known as: ZITHROMAX Take by mouth. As directed   Black Elderberry(Berry-Flower) 575 MG Caps Take by mouth.   Cod Liver Oil Caps Take 1 capsule by mouth daily.   EPINEPHrine 0.3 mg/0.3 mL Soaj injection Commonly known as: EPI-PEN Inject 0.3 mLs (0.3 mg total) into the muscle as needed for anaphylaxis.   Garlic 10 MG Caps Take by mouth.   nitroGLYCERIN 0.4 MG SL tablet Commonly known as: Nitrostat Place 1 tablet (0.4 mg total) under the tongue every 5 (five) minutes as needed for chest pain.   OVER THE COUNTER MEDICATION Take 1 tablet by mouth daily as needed. Allergy medication OTC   predniSONE 5 MG (21) Tbpk tablet Commonly known as: STERAPRED UNI-PAK 21 TAB Take by mouth as directed. As directed   rosuvastatin 5 MG tablet Commonly known as: Crestor Take 1 tablet (5 mg total)  by mouth daily.   sildenafil 20 MG tablet Commonly known as: REVATIO Take 2-3 tabs as needed for erectile dysfunction.   Vitamin D3 50 MCG (2000 UT) Tabs Take 1 tablet by mouth daily. What changed: Another medication with the same name was removed. Continue taking this medication, and follow the directions you see here. Changed by: Kathlene November, MD   Zinc 50 MG Tabs Take 1 tablet by mouth daily. What changed: Another medication with the same name was removed. Continue taking this medication, and follow the directions you see here. Changed by: Kathlene November, MD          Objective:   Physical Exam Ht 6' (1.829 m)   Wt 240 lb (108.9 kg)   BMI 32.55 kg/m  Alert oriented x3, in no  distress, speaking in complete sentences, no cough or wheezing noted.     Assessment    70 year old gentleman, history of CAD, sleep apnea on CPAP, High cholesterol, presents with:  COVID-19 infection: Symptoms started 2 days ago, went to another clinic, was Rx prednisone and Z-Pak. He is unvaccinated. At risk of severe disease due to age, CAD, OSA. The symptom that concerns me is DOE, no O2 sats or ambulatory BP is available.  He however looks very comfortable during the visit. Plan: Refer to the treatment center, if he is not call within 24 hours he will let me know. Rest, fluids, Tylenol, start getting O2 sats. ER if severe symptoms. Detailed message sent      I discussed the assessment and treatment plan with the patient. The patient was provided an opportunity to ask questions and all were answered. The patient agreed with the plan and demonstrated an understanding of the instructions.   The patient was advised to call back or seek an in-person evaluation if the symptoms worsen or if the condition fails to improve as anticipated.

## 2021-01-28 NOTE — ED Notes (Signed)
Critical: Covid + RN notified

## 2021-01-28 NOTE — Progress Notes (Unsigned)
Pre visit review using our clinic review tool, if applicable. No additional management support is needed unless otherwise documented below in the visit note. 

## 2021-01-28 NOTE — ED Notes (Signed)
RT CALLED TO PATIENT'S ROOM D/T SPO2 79% ON 15L SALTER. PT HAS HX OF OSA, BUT UNABLE TO PLACE PATIENT ON CPAP D/T BEING COVID POSITIVE.  PT PLACED IN PRONE POSITION WITH HELP ON RN. SPO2 FOLLOWING REPOSITIONING IS 97% ON 15L SALTER, HR 91, RR 30. PT TOLERATING WELL.

## 2021-01-28 NOTE — ED Provider Notes (Addendum)
[facility] Provider Note   CSN: [id number] Arrival date & time: 01/19/2021  2130     History Chief Complaint  Patient presents with  . Covid Positive    EBB CARELOCK is a 70 y.o. male with PMH/o CAD, GERD, who presents for evaluation SOB and cough. He was diagnosed with COVID-19 on Monday.  He was started on Z-Pak and prednisone as well as vitamin D, zinc.  He states that since then, he has had progressively worsening shortness of breath, cough.  He states that today, his O2 sats were in the 70s, prompting ED visit.  He states the cough is not productive.  He states that he has not been vaccinated.  He does not have a history of COPD or asthma.  He reports occasionally smokes.  He states he has been having fevers at home.  Denies any chest pain, abdominal pain, nausea/vomiting, swelling of his arms or legs.  The history is provided by the patient.       Past Medical History:  Diagnosis Date  . Coronary artery disease   . GERD (gastroesophageal reflux disease)   . Headache(784.0)   . Migraine   . OSA on CPAP     Patient Active Problem List   Diagnosis Date Noted  . Acute respiratory failure with hypoxia ([place]) 01/23/2021  . Noncompliance 10/15/2020  . Unilateral primary osteoarthritis, left knee 09/24/2020  . Unilateral primary osteoarthritis, right knee 09/24/2020  . Left lateral abdominal pain 12/21/2018  . URI (upper respiratory infection) 11/17/2018  . OSA on CPAP   . Onychomycosis due to dermatophyte 07/26/2017  . Sensorineural hearing loss (SNHL), bilateral 03/29/2016  . Coronary artery disease   . GERD (gastroesophageal reflux disease)   . Headache(784.0)   . Syncope 05/21/2012  . CAD (coronary artery disease) 05/21/2012    Past Surgical History:  Procedure Laterality Date  . CORONARY ANGIOPLASTY WITH STENT PLACEMENT  2000  . KNEE ARTHROSCOPY  1963   on Right Knee with removal of pieces of shattered bone  . KNEE ARTHROSCOPY W/  ACL RECONSTRUCTION  1973   Left knee  . OTHER SURGICAL HISTORY  2011   to lower organs on left side that were pressing on diaphragm       Family History  Problem Relation Age of Onset  . Hypertension Mother   . Cancer Mother        multiple meyloma  . Cancer Father        stomach cancer  . Diabetes Father   . Cancer Brother        throat    Social History   Tobacco Use  . Smoking status: Never Smoker  . Smokeless tobacco: Never Used  Substance Use Topics  . Alcohol use: Yes    Comment: occasionaly   . Drug use: No    Home Medications Prior to Admission medications   Medication Sig Start Date End Date Taking? Authorizing Provider  ascorbic acid (VITAMIN C) 1000 MG tablet Take 2,000 mg by mouth daily. 01/26/21   [provider]  aspirin 81 MG chewable tablet Chew 81 mg by mouth daily.    [provider]  ASTAXANTHIN PO Take 12 mg by mouth daily.    [provider]  azithromycin (ZITHROMAX) 250 MG tablet Take by mouth. As directed 01/26/21   [provider]  Black Elderberry,Berry-Flower, 575 MG CAPS Take by mouth.    [provider]  Cholecalciferol (VITAMIN D3) 50 MCG (2000  UT) TABS Take 1 tablet by mouth daily. 01/26/21   [provider]  Cod Liver Oil CAPS Take 1 capsule by mouth daily.    [provider]  EPINEPHrine 0.3 mg/0.3 mL IJ SOAJ injection Inject 0.3 mLs (0.3 mg total) into the muscle as needed for anaphylaxis. Patient not taking: Reported on 01/22/2021 06/19/19   Shelda Pal, DO  Garlic 10 MG CAPS Take by mouth.    [provider]  nitroGLYCERIN (NITROSTAT) 0.4 MG SL tablet Place 1 tablet (0.4 mg total) under the tongue every 5 (five) minutes as needed for chest pain. Patient not taking: Reported on 01/13/2021 08/09/18   Shelda Pal, DO  OVER THE COUNTER MEDICATION Take 1 tablet by mouth daily as needed. Allergy medication OTC    [provider]  predniSONE  (STERAPRED UNI-PAK 21 TAB) 5 MG (21) TBPK tablet Take by mouth as directed. As directed 01/26/21   [provider]  rosuvastatin (CRESTOR) 5 MG tablet Take 1 tablet (5 mg total) by mouth daily. 01/09/21   Shelda Pal, DO  sildenafil (REVATIO) 20 MG tablet Take 2-3 tabs as needed for erectile dysfunction. 08/09/18   Shelda Pal, DO  Zinc 50 MG TABS Take 1 tablet by mouth daily. 01/26/21   [provider]    Allergies    Bee venom  Review of Systems   Review of Systems  Constitutional: Positive for fatigue and fever.  Respiratory: Positive for cough and shortness of breath.   Cardiovascular: Negative for chest pain.  Gastrointestinal: Negative for abdominal pain, nausea and vomiting.  Genitourinary: Negative for dysuria and hematuria.  Neurological: Negative for weakness, numbness and headaches.  All other systems reviewed and are negative.   Physical Exam Updated Vital Signs BP 125/65   Pulse 85   Temp 98.8 F (37.1 C) (Oral)   Resp (!) 30   Ht 6' (1.829 m)   Wt 108.9 kg   SpO2 92%   BMI 32.55 kg/m   Physical Exam Vitals and nursing note reviewed.  Constitutional:      Appearance: Normal appearance. He is well-developed and well-nourished. He is ill-appearing.  HENT:     Head: Normocephalic and atraumatic.     Mouth/Throat:     Mouth: Oropharynx is clear and moist and mucous membranes are normal.  Eyes:     General: Lids are normal.     Extraocular Movements: EOM normal.     Conjunctiva/sclera: Conjunctivae normal.     Pupils: Pupils are equal, round, and reactive to light.  Cardiovascular:     Rate and Rhythm: Normal rate and regular rhythm.     Pulses: Normal pulses.     Heart sounds: Normal heart sounds. No murmur heard. No friction rub. No gallop.   Pulmonary:     Effort: Tachypnea and respiratory distress present.     Breath sounds: Rales present.     Comments: Tachypnea, increased work of breathing noted.  Diffuse rales  noted to the midlung fields that extends distally.  No wheezing. Abdominal:     Palpations: Abdomen is soft. Abdomen is not rigid.     Tenderness: There is no abdominal tenderness. There is no guarding.     Comments: Abdomen is soft, non-distended, non-tender. No rigidity, No guarding. No peritoneal signs.  Musculoskeletal:        General: Normal range of motion.     Cervical back: Full passive range of motion without pain.  Skin:    General:  Skin is warm and dry.     Capillary Refill: Capillary refill takes less than 2 seconds.  Neurological:     Mental Status: He is alert and oriented to person, place, and time.  Psychiatric:        Mood and Affect: Mood and affect normal.        Speech: Speech normal.     ED Results / Procedures / Treatments   Labs (all labs ordered are listed, but only abnormal results are displayed) Labs Reviewed  RESP PANEL BY RT-PCR (FLU A&B, COVID) ARPGX2 - Abnormal; Notable for the following components:      Result Value   SARS Coronavirus 2 by RT PCR POSITIVE (*)    All other components within normal limits  LACTIC ACID, PLASMA - Abnormal; Notable for the following components:   Lactic Acid, Venous 2.3 (*)    All other components within normal limits  CBC WITH DIFFERENTIAL/PLATELET - Abnormal; Notable for the following components:   RBC 4.11 (*)    Hemoglobin 12.3 (*)    HCT 37.8 (*)    Platelets 114 (*)    Lymphs Abs 0.5 (*)    All other components within normal limits  COMPREHENSIVE METABOLIC PANEL - Abnormal; Notable for the following components:   Sodium 132 (*)    Glucose, Bld 149 (*)    Creatinine, Ser 1.49 (*)    AST 58 (*)    GFR, Estimated 50 (*)    All other components within normal limits  D-DIMER, QUANTITATIVE - Abnormal; Notable for the following components:   D-Dimer, Quant 0.87 (*)    All other components within normal limits  CULTURE, BLOOD (ROUTINE X 2)  CULTURE, BLOOD (ROUTINE X 2)  LACTIC ACID, PLASMA  PROCALCITONIN   LACTATE DEHYDROGENASE  FERRITIN  TRIGLYCERIDES  FIBRINOGEN  C-REACTIVE PROTEIN    EKG EKG Interpretation  Date/Time:  Wednesday January 28 2021 21:45:00 EST Ventricular Rate:  74 PR Interval:    QRS Duration: 102 QT Interval:  378 QTC Calculation: 420 R Axis:   25 Text Interpretation: Sinus rhythm No significant change since last tracing Confirmed by Theotis Burrow [phone]) on 01/22/2021 9:47:40 PM   Radiology DG Chest Port 1 View  Result Date: 01/31/2021 CLINICAL DATA:  Cough.  Coronavirus infection. EXAM: PORTABLE CHEST 1 VIEW COMPARISON:  11/24/2017 FINDINGS: Heart size is normal. Chronic aortic atherosclerosis and tortuosity as seen previously. Hazy pulmonary infiltrates in the mid and lower lungs consistent with viral pneumonia. No dense consolidation. Chronic elevation of the left hemidiaphragm. No evidence of effusion. No acute bone finding. IMPRESSION: 1. Hazy pulmonary infiltrates in the mid and lower lungs consistent with viral pneumonia. No dense consolidation. 2. Aortic atherosclerosis. Electronically Signed   By: Nelson Chimes M.D.   On: 01/18/2021 22:21    Procedures .Critical Care Performed by: Volanda Napoleon, PA-C Authorized by: Volanda Napoleon, PA-C   Critical care provider statement:    Critical care time (minutes):  35   Critical care was necessary to treat or prevent imminent or life-threatening deterioration of the following conditions:  Respiratory failure   Critical care was time spent personally by me on the following activities:  Discussions with consultants, evaluation of patient's response to treatment, examination of patient, ordering and performing treatments and interventions, ordering and review of laboratory studies, ordering and review of radiographic studies, pulse oximetry, re-evaluation of patient's condition, obtaining history from patient or surrogate and review of old charts     Medications Ordered  in ED Medications  remdesivir 100  mg in sodium chloride 0.9 % 100 mL IVPB (0 mg Intravenous Stopped 01/21/2021 2353)    Followed by  remdesivir 100 mg in sodium chloride 0.9 % 100 mL IVPB (100 mg Intravenous New Bag/Given 01/27/2021 2354)    Followed by  remdesivir 100 mg in sodium chloride 0.9 % 100 mL IVPB (has no administration in time range)  baricitinib (OLUMIANT) tablet 4 mg (has no administration in time range)  albuterol (VENTOLIN HFA) 108 (90 Base) MCG/ACT inhaler 8 puff (8 puffs Inhalation Given 02/08/2021 2230)  dexamethasone (DECADRON) injection 10 mg (10 mg Intravenous Given 02/06/2021 2228)    ED Course  I have reviewed the triage vital signs and the nursing notes.  Pertinent labs & imaging results that were available during my care of the patient were reviewed by me and considered in my medical decision making (see chart for details).    MDM Rules/Calculators/A&P                          70 y.o. M with PMH/o CAD, GERD who presents for evaluation of SOB and cough. He was diagnosed with COVID on 2/13. Started on zofran, prednisone. Reports worsening SOB and cough. He states his O2 sats were in the 70s today. On initial ED arrival he is hypoxic into the 60s.  He has obvious work of breathing, tachypnea and has rales noted to her mid lung fields.  Concern that this is worsening Covid.  Patient started on humidified oxygen at 10 L/min which bumped his O2 sats into the 90s.  Will obtain chest x-ray, lab work, give Decadron.  Chest x-ray consistent with viral pneumonia. Dimer is 0.87. Lactic is 2.3. CMP shows normal Bun. Cr is 1.49. CBC shows no leukocytosis. Hgb stable at 12.3  CXR shows hazy pulmonary infiltrates in the mid and lower lungs consistent with viral pneumonia.   Discussed patient with Dr. Zettie Pho (hospitalist) who accepts patient for admission.   Updated patient on admission. He is agreeable. I did discuss code status with him and patient states that if he were to worsen he would want to be intubated and have CPR.    Octavian Kurt Williams Kegg was evaluated in Emergency Department on 01/29/2021 for the symptoms described in the history of present illness. He was evaluated in the context of the global COVID-19 pandemic, which necessitated consideration that the patient might be at risk for infection with the SARS-CoV-2 virus that causes COVID-19. Institutional protocols and algorithms that pertain to the evaluation of patients at risk for COVID-19 are in a state of rapid change based on information released by regulatory bodies including the CDC and federal and state organizations. These policies and algorithms were followed during the patient's care in the ED.   Portions of this note were generated with [employer]. Dictation errors may occur despite best attempts at proofreading.  Final Clinical Impression(s) / ED Diagnoses Final diagnoses:  COVID-19  Hypoxia    Rx / DC Orders ED Discharge Orders    None       Volanda Napoleon, PA-C 02/09/2021 2326    Volanda Napoleon, PA-C 01/29/21 0004    Little, Wenda Overland, MD 01/31/21 [phone]

## 2021-01-29 ENCOUNTER — Encounter (HOSPITAL_COMMUNITY): Payer: Self-pay | Admitting: Internal Medicine

## 2021-01-29 ENCOUNTER — Inpatient Hospital Stay (HOSPITAL_COMMUNITY): Payer: Medicare Other

## 2021-01-29 DIAGNOSIS — Z955 Presence of coronary angioplasty implant and graft: Secondary | ICD-10-CM | POA: Diagnosis not present

## 2021-01-29 DIAGNOSIS — E669 Obesity, unspecified: Secondary | ICD-10-CM | POA: Diagnosis present

## 2021-01-29 DIAGNOSIS — Z66 Do not resuscitate: Secondary | ICD-10-CM | POA: Diagnosis not present

## 2021-01-29 DIAGNOSIS — S27399A Other injuries of lung, unspecified, initial encounter: Secondary | ICD-10-CM | POA: Diagnosis present

## 2021-01-29 DIAGNOSIS — R7989 Other specified abnormal findings of blood chemistry: Secondary | ICD-10-CM | POA: Diagnosis not present

## 2021-01-29 DIAGNOSIS — N1831 Chronic kidney disease, stage 3a: Secondary | ICD-10-CM

## 2021-01-29 DIAGNOSIS — Z91030 Bee allergy status: Secondary | ICD-10-CM | POA: Diagnosis not present

## 2021-01-29 DIAGNOSIS — Z283 Underimmunization status: Secondary | ICD-10-CM | POA: Diagnosis not present

## 2021-01-29 DIAGNOSIS — Z515 Encounter for palliative care: Secondary | ICD-10-CM | POA: Diagnosis not present

## 2021-01-29 DIAGNOSIS — Z9989 Dependence on other enabling machines and devices: Secondary | ICD-10-CM

## 2021-01-29 DIAGNOSIS — Z6832 Body mass index (BMI) 32.0-32.9, adult: Secondary | ICD-10-CM | POA: Diagnosis not present

## 2021-01-29 DIAGNOSIS — G43909 Migraine, unspecified, not intractable, without status migrainosus: Secondary | ICD-10-CM | POA: Diagnosis present

## 2021-01-29 DIAGNOSIS — K219 Gastro-esophageal reflux disease without esophagitis: Secondary | ICD-10-CM | POA: Diagnosis present

## 2021-01-29 DIAGNOSIS — Z8249 Family history of ischemic heart disease and other diseases of the circulatory system: Secondary | ICD-10-CM | POA: Diagnosis not present

## 2021-01-29 DIAGNOSIS — I25118 Atherosclerotic heart disease of native coronary artery with other forms of angina pectoris: Secondary | ICD-10-CM

## 2021-01-29 DIAGNOSIS — U071 COVID-19: Principal | ICD-10-CM

## 2021-01-29 DIAGNOSIS — J80 Acute respiratory distress syndrome: Secondary | ICD-10-CM | POA: Diagnosis not present

## 2021-01-29 DIAGNOSIS — J9601 Acute respiratory failure with hypoxia: Secondary | ICD-10-CM | POA: Diagnosis present

## 2021-01-29 DIAGNOSIS — N183 Chronic kidney disease, stage 3 unspecified: Secondary | ICD-10-CM | POA: Diagnosis present

## 2021-01-29 DIAGNOSIS — I2510 Atherosclerotic heart disease of native coronary artery without angina pectoris: Secondary | ICD-10-CM | POA: Diagnosis present

## 2021-01-29 DIAGNOSIS — J1282 Pneumonia due to coronavirus disease 2019: Secondary | ICD-10-CM | POA: Diagnosis not present

## 2021-01-29 DIAGNOSIS — R0602 Shortness of breath: Secondary | ICD-10-CM | POA: Diagnosis not present

## 2021-01-29 DIAGNOSIS — Z800 Family history of malignant neoplasm of digestive organs: Secondary | ICD-10-CM | POA: Diagnosis not present

## 2021-01-29 DIAGNOSIS — Z7982 Long term (current) use of aspirin: Secondary | ICD-10-CM | POA: Diagnosis not present

## 2021-01-29 DIAGNOSIS — E871 Hypo-osmolality and hyponatremia: Secondary | ICD-10-CM | POA: Diagnosis present

## 2021-01-29 DIAGNOSIS — G4733 Obstructive sleep apnea (adult) (pediatric): Secondary | ICD-10-CM

## 2021-01-29 DIAGNOSIS — I5031 Acute diastolic (congestive) heart failure: Secondary | ICD-10-CM | POA: Diagnosis not present

## 2021-01-29 DIAGNOSIS — H903 Sensorineural hearing loss, bilateral: Secondary | ICD-10-CM | POA: Diagnosis present

## 2021-01-29 DIAGNOSIS — N179 Acute kidney failure, unspecified: Secondary | ICD-10-CM | POA: Diagnosis not present

## 2021-01-29 DIAGNOSIS — Z79899 Other long term (current) drug therapy: Secondary | ICD-10-CM | POA: Diagnosis not present

## 2021-01-29 DIAGNOSIS — Z833 Family history of diabetes mellitus: Secondary | ICD-10-CM | POA: Diagnosis not present

## 2021-01-29 DIAGNOSIS — R918 Other nonspecific abnormal finding of lung field: Secondary | ICD-10-CM | POA: Diagnosis not present

## 2021-01-29 LAB — COMPREHENSIVE METABOLIC PANEL
ALT: 37 U/L (ref 0–44)
AST: 62 U/L — ABNORMAL HIGH (ref 15–41)
Albumin: 3.3 g/dL — ABNORMAL LOW (ref 3.5–5.0)
Alkaline Phosphatase: 55 U/L (ref 38–126)
Anion gap: 10 (ref 5–15)
BUN: 11 mg/dL (ref 8–23)
CO2: 24 mmol/L (ref 22–32)
Calcium: 9.4 mg/dL (ref 8.9–10.3)
Chloride: 102 mmol/L (ref 98–111)
Creatinine, Ser: 1.43 mg/dL — ABNORMAL HIGH (ref 0.61–1.24)
GFR, Estimated: 52 mL/min — ABNORMAL LOW (ref >60)
Glucose, Bld: 141 mg/dL — ABNORMAL HIGH (ref 70–99)
Potassium: 4.8 mmol/L (ref 3.5–5.1)
Sodium: 136 mmol/L (ref 135–145)
Total Bilirubin: 0.6 mg/dL (ref 0.3–1.2)
Total Protein: 6.9 g/dL (ref 6.5–8.1)

## 2021-01-29 LAB — URINALYSIS, ROUTINE W REFLEX MICROSCOPIC
Bacteria, UA: NONE SEEN
Bilirubin Urine: NEGATIVE
Glucose, UA: NEGATIVE mg/dL
Ketones, ur: NEGATIVE mg/dL
Leukocytes,Ua: NEGATIVE
Nitrite: NEGATIVE
Protein, ur: NEGATIVE mg/dL
RBC / HPF: >50 RBC/hpf — ABNORMAL HIGH (ref 0–5)
Specific Gravity, Urine: 1.008 (ref 1.005–1.030)
pH: 6 (ref 5.0–8.0)

## 2021-01-29 LAB — CBC WITH DIFFERENTIAL/PLATELET
Abs Immature Granulocytes: 0.01 10*3/uL (ref 0.00–0.07)
Basophils Absolute: 0 10*3/uL (ref 0.0–0.1)
Basophils Relative: 0 %
Eosinophils Absolute: 0 10*3/uL (ref 0.0–0.5)
Eosinophils Relative: 0 %
HCT: 37.7 % — ABNORMAL LOW (ref 39.0–52.0)
Hemoglobin: 12 g/dL — ABNORMAL LOW (ref 13.0–17.0)
Immature Granulocytes: 0 %
Lymphocytes Relative: 10 %
Lymphs Abs: 0.4 10*3/uL — ABNORMAL LOW (ref 0.7–4.0)
MCH: 29.3 pg (ref 26.0–34.0)
MCHC: 31.8 g/dL (ref 30.0–36.0)
MCV: 92 fL (ref 80.0–100.0)
Monocytes Absolute: 0.3 10*3/uL (ref 0.1–1.0)
Monocytes Relative: 8 %
Neutro Abs: 3.2 10*3/uL (ref 1.7–7.7)
Neutrophils Relative %: 82 %
Platelets: 127 10*3/uL — ABNORMAL LOW (ref 150–400)
RBC: 4.1 MIL/uL — ABNORMAL LOW (ref 4.22–5.81)
RDW: 13.4 % (ref 11.5–15.5)
WBC: 4 10*3/uL (ref 4.0–10.5)
nRBC: 0 % (ref 0.0–0.2)

## 2021-01-29 LAB — BRAIN NATRIURETIC PEPTIDE: B Natriuretic Peptide: 43.4 pg/mL (ref 0.0–100.0)

## 2021-01-29 LAB — HEPATITIS B SURFACE ANTIGEN: Hepatitis B Surface Ag: NONREACTIVE

## 2021-01-29 LAB — GLUCOSE, CAPILLARY
Glucose-Capillary: 139 mg/dL — ABNORMAL HIGH (ref 70–99)
Glucose-Capillary: 146 mg/dL — ABNORMAL HIGH (ref 70–99)
Glucose-Capillary: 116 mg/dL — ABNORMAL HIGH (ref 70–99)
Glucose-Capillary: 136 mg/dL — ABNORMAL HIGH (ref 70–99)

## 2021-01-29 LAB — LACTIC ACID, PLASMA: Lactic Acid, Venous: 2.4 mmol/L (ref 0.5–1.9)

## 2021-01-29 LAB — HEMOGLOBIN A1C
Hgb A1c MFr Bld: 6 % — ABNORMAL HIGH (ref 4.8–5.6)
Mean Plasma Glucose: 125.5 mg/dL

## 2021-01-29 LAB — LACTATE DEHYDROGENASE: LDH: 398 U/L — ABNORMAL HIGH (ref 98–192)

## 2021-01-29 LAB — TRIGLYCERIDES: Triglycerides: 93 mg/dL (ref <150)

## 2021-01-29 LAB — C-REACTIVE PROTEIN
CRP: 1.8 mg/dL — ABNORMAL HIGH
CRP: 2 mg/dL — ABNORMAL HIGH (ref <1.0)

## 2021-01-29 LAB — FERRITIN: Ferritin: 647 ng/mL — ABNORMAL HIGH (ref 24–336)

## 2021-01-29 LAB — MAGNESIUM: Magnesium: 2 mg/dL (ref 1.7–2.4)

## 2021-01-29 LAB — RESP PANEL BY RT-PCR (FLU A&B, COVID) ARPGX2
Influenza A by PCR: NEGATIVE
Influenza B by PCR: NEGATIVE
SARS Coronavirus 2 by RT PCR: POSITIVE — AB

## 2021-01-29 LAB — FIBRINOGEN: Fibrinogen: 405 mg/dL (ref 210–475)

## 2021-01-29 LAB — PROCALCITONIN: Procalcitonin: <0.1 ng/mL

## 2021-01-29 LAB — D-DIMER, QUANTITATIVE: D-Dimer, Quant: 1.55 ug/mL-FEU — ABNORMAL HIGH (ref 0.00–0.50)

## 2021-01-29 MED ORDER — NITROGLYCERIN 0.4 MG SL SUBL: 0.4000 mg | SUBLINGUAL_TABLET | SUBLINGUAL | Status: DC | PRN

## 2021-01-29 MED ORDER — METHYLPREDNISOLONE SODIUM SUCC 125 MG IJ SOLR
0.5000 mg/kg | Freq: Two times a day (BID) | INTRAMUSCULAR | Status: DC
Start: 2021-01-29 — End: 2021-01-29

## 2021-01-29 MED ORDER — ACETAMINOPHEN 325 MG PO TABS
650.0000 mg | ORAL_TABLET | Freq: Four times a day (QID) | ORAL | Status: DC | PRN
  Administered 2021-01-30 – 2021-02-13 (×9): 650 mg via ORAL
  Filled 2021-01-29 (×10): qty 2

## 2021-01-29 MED ORDER — TOCILIZUMAB 162 MG/0.9ML ~~LOC~~ SOSY
810.0000 mg | PREFILLED_SYRINGE | Freq: Once | SUBCUTANEOUS | Status: AC
  Administered 2021-01-29: 810 mg via INTRAVENOUS
  Filled 2021-01-29: qty 4.5

## 2021-01-29 MED ORDER — METHYLPREDNISOLONE SODIUM SUCC 125 MG IJ SOLR
60.0000 mg | Freq: Two times a day (BID) | INTRAMUSCULAR | Status: DC
  Administered 2021-01-29 – 2021-02-01 (×7): 60 mg via INTRAVENOUS
  Filled 2021-01-29 (×7): qty 2

## 2021-01-29 MED ORDER — PANTOPRAZOLE SODIUM 40 MG PO TBEC
40.0000 mg | DELAYED_RELEASE_TABLET | Freq: Every day | ORAL | Status: DC
  Administered 2021-01-29 – 2021-02-13 (×16): 40 mg via ORAL
  Filled 2021-01-29 (×16): qty 1

## 2021-01-29 MED ORDER — TAMSULOSIN HCL 0.4 MG PO CAPS
0.4000 mg | ORAL_CAPSULE | Freq: Every day | ORAL | Status: DC
  Administered 2021-01-29 – 2021-02-13 (×16): 0.4 mg via ORAL
  Filled 2021-01-29 (×16): qty 1

## 2021-01-29 MED ORDER — ONDANSETRON HCL 4 MG/2ML IJ SOLN: 4.0000 mg | Freq: Four times a day (QID) | INTRAMUSCULAR | Status: DC | PRN

## 2021-01-29 MED ORDER — ROSUVASTATIN CALCIUM 5 MG PO TABS
5.0000 mg | ORAL_TABLET | Freq: Every day | ORAL | Status: DC
  Administered 2021-01-29 – 2021-02-13 (×16): 5 mg via ORAL
  Filled 2021-01-29 (×16): qty 1

## 2021-01-29 MED ORDER — ONDANSETRON HCL 4 MG PO TABS: 4.0000 mg | ORAL_TABLET | Freq: Four times a day (QID) | ORAL | Status: DC | PRN

## 2021-01-29 MED ORDER — ASPIRIN 81 MG PO CHEW
81.0000 mg | CHEWABLE_TABLET | Freq: Every day | ORAL | Status: DC
  Administered 2021-01-29 – 2021-02-13 (×16): 81 mg via ORAL
  Filled 2021-01-29 (×16): qty 1

## 2021-01-29 MED ORDER — LACTATED RINGERS IV SOLN: INTRAVENOUS | Status: AC

## 2021-01-29 MED ORDER — ENOXAPARIN SODIUM 40 MG/0.4ML ~~LOC~~ SOLN
40.0000 mg | SUBCUTANEOUS | Status: DC
  Administered 2021-01-29: 40 mg via SUBCUTANEOUS
  Filled 2021-01-29: qty 0.4

## 2021-01-29 MED ORDER — PREDNISONE 5 MG PO TABS: 50.0000 mg | ORAL_TABLET | Freq: Every day | ORAL | Status: DC

## 2021-01-29 MED ORDER — ENOXAPARIN SODIUM 60 MG/0.6ML ~~LOC~~ SOLN
55.0000 mg | SUBCUTANEOUS | Status: DC
  Administered 2021-01-30 – 2021-02-11 (×13): 55 mg via SUBCUTANEOUS
  Filled 2021-01-29 (×13): qty 0.6

## 2021-01-29 MED ORDER — SODIUM CHLORIDE 0.9 % IV SOLN
100.0000 mg | Freq: Every day | INTRAVENOUS | Status: AC
  Administered 2021-01-29 – 2021-02-01 (×4): 100 mg via INTRAVENOUS
  Filled 2021-01-29 (×6): qty 20

## 2021-01-29 MED ORDER — INSULIN ASPART 100 UNIT/ML ~~LOC~~ SOLN
0.0000 [IU] | SUBCUTANEOUS | Status: DC
  Administered 2021-01-29 – 2021-01-30 (×7): 1 [IU] via SUBCUTANEOUS

## 2021-01-29 NOTE — Progress Notes (Signed)
Dr. Candiss Norse would like the pt to get a dose of Actemra instead of continuing baricitinib. Also ok to change Lovenox to 0.5mg /kg/day.  Onnie Boer, PharmD, BCIDP, AAHIVP, CPP Infectious Disease Pharmacist 01/29/2021 10:44 AM

## 2021-01-29 NOTE — ED Notes (Signed)
Tried calling reports on patient and they stated the nurse is on break.

## 2021-01-29 NOTE — ED Provider Notes (Signed)
Patient transferred from [facility]. He was found to be Covid positive with acute respiratory failure.  On my exam patient is awake alert, currently on 15 L of oxygen but has no significant increased work of breathing He is awaiting stepdown bed.  If necessary, we may need to transition him to noninvasive ventilation.   Ripley Fraise, MD 01/29/21 [phone]

## 2021-01-29 NOTE — Progress Notes (Signed)
Lower extremity venous bilateral study completed.   Please see CV Proc for preliminary results.   Rozelle Caudle, RDMS  

## 2021-01-29 NOTE — ED Notes (Signed)
Called pts family, updated on POC.  Request to please call family and update in the morning or when pt gets a bed.

## 2021-01-29 NOTE — Progress Notes (Signed)
Placed PT on cpap via V60 due to increased 02 need. Pt resting comfortably at this time.

## 2021-01-29 NOTE — ED Provider Notes (Signed)
Patient with COVID-19.  Unvaccinated.  Currently semiprone and on 15 L of oxygen.  At times noted to desat when sleeping.  At baseline has OSA and uses CPAP at night.  Do not have the ability to do a closed circuit BiPAP or CPAP.  Inquired whether a stepdown unit bed was available.  Per AC, no stepdown unit bed available; however, given his status and potential need for BiPAP/CPAP, was informed that he would not meet criteria for progressive care bed.  I spoke with the intensivist regarding the patient.  Given that his CPAP is for his OSA, he felt that he was appropriate for progressive bed and did not need ICU care at this time.  No beds are available.  Given need for close circuit positive pressure for his obstructive sleep apnea, will plan for ED to ED transfer.  Discussed with Dr. Christy Gentles who has accepted in transfer.  Physical Exam  BP 112/71   Pulse 79   Temp 98.8 F (37.1 C) (Oral)   Resp (!) 37   Ht 1.829 m (6')   Wt 108.9 kg   SpO2 95%   BMI 32.55 kg/m   Problem List Items Addressed This Visit   None   Visit Diagnoses    COVID-19    -  Primary   Relevant Medications   remdesivir 100 mg in sodium chloride 0.9 % 100 mL IVPB (Completed)   remdesivir 100 mg in sodium chloride 0.9 % 100 mL IVPB (Completed)   remdesivir 100 mg in sodium chloride 0.9 % 100 mL IVPB (Start on 01/30/2021  6:00 PM)   Hypoxia              Dina Rich, Barbette Hair, MD 01/29/21 0201

## 2021-01-29 NOTE — H&P (Signed)
History and Physical    Kurt Williams DGU:[id number] DOB: 1951/03/04 DOA: 02-20-2021  PCP: Sharlene Dory, DO  Patient coming from: Home  I have personally briefly reviewed patient's old medical records in [facility]  Chief Complaint: COVID  HPI: Kurt Williams is a 70 y.o. male with medical history significant of CAD, OSA on CPAP.  Pt presents to the ED with c/o SOB and cough.  Pt diagnosed with COVID-19 on Monday, he is unvaccinated.  Started on Z-pak, prednisone, Zinc.  Since that time has had worsening SOB, cough.  Today O2 sats at home in the 70s, this prompted him to come in to ED.  Cough is non-productive.  Has been having fevers at home.  No CP, abd pain, N/V.   ED Course: new 15L O2 requirement via HFNC.  Apparently episodes of desatting while asleep in ED despite this.  Desatting has resolved with placement of CPAP.  He has been started on remdesivir, steroids, and baricitinib.  Procalcitonin neg.  D.Dimer 0.87, CRP 1.8.  Creat 1.49 (looks to be chronic and about baseline, though PMH on chart doesn't give a h/o CKD formally).  COVID+  CXR = viral PNA.  WBC nl.   Review of Systems: As per HPI, otherwise all review of systems negative.  Past Medical History:  Diagnosis Date  . Coronary artery disease   . GERD (gastroesophageal reflux disease)   . Headache(784.0)   . Migraine   . OSA on CPAP     Past Surgical History:  Procedure Laterality Date  . CORONARY ANGIOPLASTY WITH STENT PLACEMENT  2000  . KNEE ARTHROSCOPY  1963   on Right Knee with removal of pieces of shattered bone  . KNEE ARTHROSCOPY W/ ACL RECONSTRUCTION  1973   Left knee  . OTHER SURGICAL HISTORY  2011   to lower organs on left side that were pressing on diaphragm     reports that he has never smoked. He has never used smokeless tobacco. He reports current alcohol use. He reports that he does not use drugs.  Allergies  Allergen Reactions  . Bee Venom     Family History   Problem Relation Age of Onset  . Hypertension Mother   . Cancer Mother        multiple meyloma  . Cancer Father        stomach cancer  . Diabetes Father   . Cancer Brother        throat     Prior to Admission medications   Medication Sig Start Date End Date Taking? Authorizing Provider  ascorbic acid (VITAMIN C) 1000 MG tablet Take 2,000 mg by mouth daily. 01/26/21   [provider]  aspirin 81 MG chewable tablet Chew 81 mg by mouth daily.    [provider]  ASTAXANTHIN PO Take 12 mg by mouth daily.    [provider]  azithromycin (ZITHROMAX) 250 MG tablet Take by mouth. As directed 01/26/21   [provider]  Black Elderberry,Berry-Flower, 575 MG CAPS Take by mouth.    [provider]  Cholecalciferol (VITAMIN D3) 50 MCG (2000 UT) TABS Take 1 tablet by mouth daily. 01/26/21   [provider]  Cod Liver Oil CAPS Take 1 capsule by mouth daily.    [provider]  EPINEPHrine 0.3 mg/0.3 mL IJ SOAJ injection Inject 0.3 mLs (0.3 mg total) into the muscle as needed for anaphylaxis. Patient not taking: Reported on 02/20/2021 06/19/19   Arva Chafe  Paul, DO  Garlic 10 MG CAPS Take by mouth.    [provider]  nitroGLYCERIN (NITROSTAT) 0.4 MG SL tablet Place 1 tablet (0.4 mg total) under the tongue every 5 (five) minutes as needed for chest pain. Patient not taking: Reported on 01/18/2021 08/09/18   Sharlene Dory, DO  OVER THE COUNTER MEDICATION Take 1 tablet by mouth daily as needed. Allergy medication OTC    [provider]  predniSONE (STERAPRED UNI-PAK 21 TAB) 5 MG (21) TBPK tablet Take by mouth as directed. As directed 01/26/21   [provider]  rosuvastatin (CRESTOR) 5 MG tablet Take 1 tablet (5 mg total) by mouth daily. 01/09/21   Sharlene Dory, DO  sildenafil (REVATIO) 20 MG tablet Take 2-3 tabs as needed for erectile dysfunction. 08/09/18   Sharlene Dory, DO  Zinc  50 MG TABS Take 1 tablet by mouth daily. 01/26/21   [provider]    Physical Exam: Vitals:   01/29/21 0455 01/29/21 0500 01/29/21 0515 01/29/21 0530  BP:  137/82 131/77 116/75  Pulse: 76 (!) 59 (!) 57 60  Resp: (!) 31 (!) 27 (!) 25 (!) 32  Temp:      TempSrc:      SpO2: 97% 98% 98% 99%  Weight:      Height:        Constitutional: NAD, calm, comfortable Eyes: PERRL, lids and conjunctivae normal ENMT: Mucous membranes are moist. Posterior pharynx clear of any exudate or lesions.Normal dentition.  Neck: normal, supple, no masses, no thyromegaly Respiratory: clear to auscultation bilaterally, no wheezing, no crackles. Normal respiratory effort. No accessory muscle use.  Cardiovascular: Regular rate and rhythm, no murmurs / rubs / gallops. No extremity edema. 2+ pedal pulses. No carotid bruits.  Abdomen: no tenderness, no masses palpated. No hepatosplenomegaly. Bowel sounds positive.  Musculoskeletal: no clubbing / cyanosis. No joint deformity upper and lower extremities. Good ROM, no contractures. Normal muscle tone.  Skin: no rashes, lesions, ulcers. No induration Neurologic: CN 2-12 grossly intact. Sensation intact, DTR normal. Strength 5/5 in all 4.  Psychiatric: Normal judgment and insight. Alert and oriented x 3. Normal mood.    Labs on Admission: I have personally reviewed following labs and imaging studies  CBC: Recent Labs  Lab 01/17/2021 2209  WBC 5.4  NEUTROABS 4.3  HGB 12.3*  HCT 37.8*  MCV 92.0  PLT 114*   Basic Metabolic Panel: Recent Labs  Lab 01/30/2021 2209  NA 132*  K 4.2  CL 99  CO2 23  GLUCOSE 149*  BUN 14  CREATININE 1.49*  CALCIUM 9.0   GFR: Estimated Creatinine Clearance: 58 mL/min (A) (by C-G formula based on SCr of 1.49 mg/dL (H)). Liver Function Tests: Recent Labs  Lab 01/17/2021 2209  AST 58*  ALT 33  ALKPHOS 49  BILITOT 0.6  PROT 7.6  ALBUMIN 3.7   No results for input(s): LIPASE, AMYLASE in the last 168 hours. No  results for input(s): AMMONIA in the last 168 hours. Coagulation Profile: No results for input(s): INR, PROTIME in the last 168 hours. Cardiac Enzymes: No results for input(s): CKTOTAL, CKMB, CKMBINDEX, TROPONINI in the last 168 hours. BNP (last 3 results) No results for input(s): PROBNP in the last 8760 hours. HbA1C: No results for input(s): HGBA1C in the last 72 hours. CBG: No results for input(s): GLUCAP in the last 168 hours. Lipid Profile: Recent Labs    01/15/2021 2209  TRIG 93   Thyroid Function Tests: No results for  input(s): TSH, T4TOTAL, FREET4, T3FREE, THYROIDAB in the last 72 hours. Anemia Panel: Recent Labs    Feb 08, 2021 2209  FERRITIN 647*   Urine analysis:    Component Value Date/Time   COLORURINE YELLOW 05/21/2012 0625   APPEARANCEUR HAZY (A) 05/21/2012 0625   LABSPEC 1.015 05/26/2014 1227   PHURINE 8.0 05/26/2014 1227   GLUCOSEU NEGATIVE 05/26/2014 1227   HGBUR TRACE (A) 05/26/2014 1227   BILIRUBINUR NEGATIVE 05/26/2014 1227   KETONESUR NEGATIVE 05/26/2014 1227   PROTEINUR NEGATIVE 05/26/2014 1227   UROBILINOGEN 0.2 05/26/2014 1227   NITRITE NEGATIVE 05/26/2014 1227   LEUKOCYTESUR NEGATIVE 05/26/2014 1227    Radiological Exams on Admission: DG Chest Port 1 View  Result Date: February 08, 2021 CLINICAL DATA:  Cough.  Coronavirus infection. EXAM: PORTABLE CHEST 1 VIEW COMPARISON:  11/24/2017 FINDINGS: Heart size is normal. Chronic aortic atherosclerosis and tortuosity as seen previously. Hazy pulmonary infiltrates in the mid and lower lungs consistent with viral pneumonia. No dense consolidation. Chronic elevation of the left hemidiaphragm. No evidence of effusion. No acute bone finding. IMPRESSION: 1. Hazy pulmonary infiltrates in the mid and lower lungs consistent with viral pneumonia. No dense consolidation. 2. Aortic atherosclerosis. Electronically Signed   By: Paulina Fusi M.D.   On: 02/08/2021 22:21    EKG: Independently  reviewed.  Assessment/Plan Principal Problem:   Acute hypoxemic respiratory failure due to COVID-19 [facility]) Active Problems:   CAD (coronary artery disease)   OSA on CPAP   CKD (chronic kidney disease) stage 3, GFR 30-59 ml/min (HCC)    1. Acute hypoxic resp failure due to COVID-19 1. COVID pathway 2. remdesivir 3. Steroids 4. baricitinib 5. Daily labs 6. Cont pulse ox 2. OSA on chronic CPAP QHS- 1. Desatting episodes while asleep in ED despite 15L now resolved with placement of CPAP 2. Will keep NPO for now till he wakes up later this morning and we see if he maintain sats off of CPAP (on just 15L instead, suspect he will). 3. Decide later this morning: maint IVF vs PO intake 3. CAD - 1. Med rec pending 2. Cont home meds 4. CKD 3a - 1. Suspect creat today is either at or near his baseline  DVT prophylaxis: Lovenox Code Status: Full Family Communication: No family in room Disposition Plan: Home after O2 requirement improved Consults called: None Admission status: Admit to inpatient  Severity of Illness: The appropriate patient status for this patient is INPATIENT. Inpatient status is judged to be reasonable and necessary in order to provide the required intensity of service to ensure the patient's safety. The patient's presenting symptoms, physical exam findings, and initial radiographic and laboratory data in the context of their chronic comorbidities is felt to place them at high risk for further clinical deterioration. Furthermore, it is not anticipated that the patient will be medically stable for discharge from the hospital within 2 midnights of admission. The following factors support the patient status of inpatient.   IP status due to new 15L O2 requirement secondary to COVID-19.  * I certify that at the point of admission it is my clinical judgment that the patient will require inpatient hospital care spanning beyond 2 midnights from the point of admission due to high  intensity of service, high risk for further deterioration and high frequency of surveillance required.*    Kenyon Eshleman M. DO [facility]  How to contact the [facility] Attending or Consulting provider 7A - 7P or covering provider during after hours 7P -7A, for this patient?  1. Check the care team in [facility] and look for a) attending/consulting [facility] provider listed and b) the [facility] team listed 2. Log into [email or web address]  Amion Physician Scheduling and messaging for groups and whole hospitals  On call and physician scheduling software for group practices, residents, hospitalists and other medical providers for call, clinic, rotation and shift schedules. OnCall [employer] is a hospital-wide system for scheduling doctors and paging doctors on call. EasyPlot is for scientific plotting and data analysis.  [email or web address]  and use [place]'s universal password to access. If you do not have the password, please contact the hospital operator.  3. Locate the [facility] provider you are looking for under [facility] and page to a number that you can be directly reached. 4. If you still have difficulty reaching the provider, please page the [facility] (Director on Call) for the Hospitalists listed on amion for assistance.  01/29/2021, 5:46 AM

## 2021-01-29 NOTE — Progress Notes (Signed)
PT Cancellation Note  Patient Details Name: Kurt Williams MRN: [id number] DOB: 09/06/1951   Cancelled Treatment:    Reason Eval/Treat Not Completed: Patient not medically ready  Noted pt with LE edema, rising d-dimer and order for dopplers to assess for ? DVT. Will await results prior to initiating ambulation.   Arby Barrette, PT Pager [phone]   Rexanne Mano 01/29/2021, 2:19 PM

## 2021-01-29 NOTE — ED Notes (Signed)
MD at bedside. 

## 2021-01-29 NOTE — ED Notes (Signed)
Pt BIB EMS from [facility] due to sob.Pt is sitting on side of bed.

## 2021-01-29 NOTE — Plan of Care (Signed)

## 2021-01-29 NOTE — Progress Notes (Signed)
RT NOTES: Called to room by RN d/t patient's sats being 84% on 15L [place]. Placed patient on [place] 30L/100% with NRB mask. Sats now 94%. Will continue to monitor.

## 2021-01-29 NOTE — Progress Notes (Addendum)
PROGRESS NOTE                                                                                                                                                                                                             Patient Demographics:    Kurt Williams, is a 70 y.o. male, DOB - December 01, 1951, DGL:[id number]  Outpatient Primary MD for the patient is Shelda Pal, DO    LOS - 0  Admit date - 01/23/2021    Chief Complaint  Patient presents with  . Covid Positive       Brief Narrative (HPI from H&P) - Kurt Williams is a 70 y.o. male with medical history significant of CAD, OSA on CPAP.  Pt presents to the ED with c/o SOB and cough ongoing for 5-6 days, he is unvaccinated, he was diagnosed with severe hypoxic Resp failure due to Covid PNA.   Subjective:    Kurt Williams today has, No headache, No chest pain, No abdominal pain - No Nausea, No new weakness tingling or numbness, +ve SOB.   Assessment  & Plan :     1. Acute Hypoxic Resp. Failure due to Acute Covid 19 Viral Pneumonitis during the ongoing 2020 Covid 19 Pandemic - he is unvaccinated and unfortunately has incurred severe parenchymal lung injury due to COVID-19 pneumonia/ARDS.  He is on high-dose IV steroids, Remdesivir along with Actemra.  Also on moderate dose Lovenox as D-dimer is gradually rising, currently on heated high flow plus minus nonrebreather.  Extremely tenuous will monitor closely.  Encouraged the patient to sit up in chair in the daytime use I-S and flutter valve for pulmonary toiletry and then prone in bed when at night.  Will advance activity and titrate down oxygen as possible.  Actemra/Baricitinib  off label use - patient was told that if COVID-19 pneumonitis gets worse we might potentially use Actemra off label, patient denies any known history of active diverticulitis, tuberculosis or hepatitis, understands the risks and benefits and wants to  proceed with Actemra treatment .    SpO2: 95 % O2 Flow Rate (L/min): 30 L/min FiO2 (%): 100 %  Recent Labs  Lab 01/21/2021 2209 02/02/2021 2220 01/29/21 0311 01/29/21 0624 01/29/21 0741  WBC 5.4  --   --  4.0  --   HGB 12.3*  --   --  12.0*  --   HCT 37.8*  --   --  37.7*  --   PLT 114*  --   --  127*  --   CRP 1.8*  --   --  2.0*  --   BNP  --   --   --   --  43.4  DDIMER 0.87*  --   --  1.55*  --   PROCALCITON <0.10  --   --   --   --   AST 58*  --   --  62*  --   ALT 33  --   --  37  --   ALKPHOS 49  --   --  55  --   BILITOT 0.6  --   --  0.6  --   ALBUMIN 3.7  --   --  3.3*  --   LATICACIDVEN 2.3*  --  2.4*  --   --   SARSCOV2NAA  --  POSITIVE*  --   --   --     2.  Rising D-dimer due to intense inflammation.  On moderate dose Lovenox, will check leg ultrasound.  3.  CAD.  Chest pain-free currently on aspirin and statin for secondary prevention.  Will check echocardiogram to evaluate EF and Hinz motion. EKG Nonacute.  4.  CKD 3A.  Baseline creatinine around 1.5.  Monitor.  5.  OSA.  CPAP nightly.      Condition - Extremely Guarded  Family Communication  : daughter and wife [phone] on 01/29/21 in detail, updated on tenuous condition and poor prognosis.  Code Status :  Full  Consults  :  None  PUD Prophylaxis : PPI   Procedures  :     Echocardiogram.  Leg ultrasound.      Disposition Plan  :    Status is: Inpatient  Remains inpatient appropriate because:IV treatments appropriate due to intensity of illness or inability to take PO   Dispo: The patient is from: Home              Anticipated d/c is to: Home              Anticipated d/c date is: > 3 days              Patient currently is not medically stable to d/c.   Difficult to place patient No  DVT Prophylaxis  :  Lovenox    Lab Results  Component Value Date   PLT 127 (L) 01/29/2021    Diet :  Diet Order            Diet Heart Room service appropriate? Yes; Fluid consistency: Thin   Diet effective now                  Inpatient Medications  Scheduled Meds: . aspirin  81 mg Oral Daily  . [START ON 01/30/2021] enoxaparin (LOVENOX) injection  55 mg Subcutaneous Q24H  . insulin aspart  0-9 Units Subcutaneous Q4H  . methylPREDNISolone (SOLU-MEDROL) injection  60 mg Intravenous Q12H  . pantoprazole  40 mg Oral Daily  . rosuvastatin  5 mg Oral Daily   Continuous Infusions: . lactated ringers 100 mL/hr at 01/29/21 0825  . remdesivir 100 mg in NS 100 mL    . tocilizumab (ACTEMRA) - non-COVID treatment     PRN Meds:.acetaminophen, nitroGLYCERIN, [DISCONTINUED] ondansetron **OR** ondansetron (ZOFRAN) IV  Antibiotics  :    Anti-infectives (From admission, onward)   Start  Dose/Rate Route Frequency Ordered Stop   01/30/21 1800  remdesivir 100 mg in sodium chloride 0.9 % 100 mL IVPB  Status:  Discontinued       "Followed by" Linked Group Details   100 mg 200 mL/hr over 30 Minutes Intravenous Daily 02/05/2021 2311 01/29/21 1041   01/29/21 1600  remdesivir 100 mg in sodium chloride 0.9 % 100 mL IVPB       "Followed by" Linked Group Details   100 mg 200 mL/hr over 30 Minutes Intravenous Daily 01/29/21 1041 02/02/21 0959   02/04/2021 2339  remdesivir 100 mg in sodium chloride 0.9 % 100 mL IVPB       "Followed by" Linked Group Details   100 mg 200 mL/hr over 30 Minutes Intravenous  Once 02/05/2021 2311 01/29/21 0028   01/20/2021 2315  remdesivir 100 mg in sodium chloride 0.9 % 100 mL IVPB       "Followed by" Linked Group Details   100 mg 200 mL/hr over 30 Minutes Intravenous  Once 01/21/2021 2311 01/31/2021 2353       Time Spent in minutes  30   Lala Lund M.D on 01/29/2021 at 11:13 AM  To page go to [email or web address]   [facility] -  Office  [phone]    See all Orders from today for further details    Objective:   Vitals:   01/29/21 0530 01/29/21 0624 01/29/21 0843 01/29/21 1026  BP: 116/75 130/78 116/74   Pulse: 60 64 60   Resp: (!) 32 (!) 24 (!)  21   Temp:  99.1 F (37.3 C) 99.2 F (37.3 C)   TempSrc:  Oral Oral   SpO2: 99% 90% 91% 95%  Weight:  107.6 kg    Height:  6' (1.829 m)      Wt Readings from Last 3 Encounters:  01/29/21 107.6 kg  01/30/2021 108.9 kg  01/09/21 114.8 kg    No intake or output data in the 24 hours ending 01/29/21 1113   Physical Exam  Awake Alert, No new F.N deficits, Normal affect [place].AT,PERRAL Supple Neck,No JVD, No cervical lymphadenopathy appriciated.  Symmetrical Chest Noe movement, Good air movement bilaterally, CTAB RRR,No Gallops,Rubs or new Murmurs, No Parasternal Heave +ve B.Sounds, Abd Soft, No tenderness, No organomegaly appriciated, No rebound - guarding or rigidity. No Cyanosis, Clubbing or edema, No new Rash or bruise      Data Review:    CBC Recent Labs  Lab 01/25/2021 2209 01/29/21 0624  WBC 5.4 4.0  HGB 12.3* 12.0*  HCT 37.8* 37.7*  PLT 114* 127*  MCV 92.0 92.0  MCH 29.9 29.3  MCHC 32.5 31.8  RDW 13.3 13.4  LYMPHSABS 0.5* 0.4*  MONOABS 0.6 0.3  EOSABS 0.0 0.0  BASOSABS 0.0 0.0    Recent Labs  Lab 02/02/2021 2209 01/29/21 0311 01/29/21 0624 01/29/21 0741  NA 132*  --  136  --   K 4.2  --  4.8  --   CL 99  --  102  --   CO2 23  --  24  --   GLUCOSE 149*  --  141*  --   BUN 14  --  11  --   CREATININE 1.49*  --  1.43*  --   CALCIUM 9.0  --  9.4  --   AST 58*  --  62*  --   ALT 33  --  37  --   ALKPHOS 49  --  55  --   BILITOT 0.6  --  0.6  --   ALBUMIN 3.7  --  3.3*  --   MG  --   --   --  2.0  CRP 1.8*  --  2.0*  --   DDIMER 0.87*  --  1.55*  --   PROCALCITON <0.10  --   --   --   LATICACIDVEN 2.3* 2.4*  --   --   HGBA1C  --   --  6.0*  --   BNP  --   --   --  43.4    ------------------------------------------------------------------------------------------------------------------ Recent Labs    02/01/2021 2209  TRIG 93    Lab Results  Component Value Date   HGBA1C 6.0 (H) 01/29/2021    ------------------------------------------------------------------------------------------------------------------ No results for input(s): TSH, T4TOTAL, T3FREE, THYROIDAB in the last 72 hours.  Invalid input(s): FREET3  Cardiac Enzymes No results for input(s): CKMB, TROPONINI, MYOGLOBIN in the last 168 hours.  Invalid input(s): CK ------------------------------------------------------------------------------------------------------------------    Component Value Date/Time   BNP 43.4 01/29/2021 0741    Micro Results Recent Results (from the past 240 hour(s))  Resp Panel by RT-PCR (Flu A&B, Covid) Nasopharyngeal Swab     Status: Abnormal   Collection Time: 02/03/2021 10:20 PM   Specimen: Nasopharyngeal Swab; Nasopharyngeal(NP) swabs in vial transport medium  Result Value Ref Range Status   SARS Coronavirus 2 by RT PCR POSITIVE (A) NEGATIVE Final    Comment: RESULT CALLED TO, READ BACK BY AND VERIFIED WITH: ALFRED DILLARD RN AT 1443 BY AV CALLED ON 01/31/2021 BY A VASSER (NOTE) SARS-CoV-2 target nucleic acids are DETECTED.  The SARS-CoV-2 RNA is generally detectable in upper respiratory specimens during the acute phase of infection. Positive results are indicative of the presence of the identified virus, but do not rule out bacterial infection or co-infection with other pathogens not detected by the test. Clinical correlation with patient history and other diagnostic information is necessary to determine patient infection status. The expected result is Negative.  Fact Sheet for Patients: EntrepreneurPulse.com.au  Fact Sheet for Healthcare Providers: IncredibleEmployment.be  This test is not yet approved or cleared by the [place] FDA and  has been authorized for detection and/or diagnosis of SARS-CoV-2 by FDA under an Emergency Use Authorization (EUA).  This EUA will remain in ef fect (meaning this test can be used) for the duration  of  the COVID-19 declaration under Section 564(b)(1) of the Act, 21 U.S.C. section 360bbb-3(b)(1), unless the authorization is terminated or revoked sooner.     Influenza A by PCR NEGATIVE NEGATIVE Final   Influenza B by PCR NEGATIVE NEGATIVE Final    Comment: (NOTE) The Xpert Xpress SARS-CoV-2/FLU/RSV plus assay is intended as an aid in the diagnosis of influenza from Nasopharyngeal swab specimens and should not be used as a sole basis for treatment. Nasal washings and aspirates are unacceptable for Xpert Xpress SARS-CoV-2/FLU/RSV testing.  Fact Sheet for Patients: EntrepreneurPulse.com.au  Fact Sheet for Healthcare Providers: IncredibleEmployment.be  This test is not yet approved or cleared by the [place] FDA and has been authorized for detection and/or diagnosis of SARS-CoV-2 by FDA under an Emergency Use Authorization (EUA). This EUA will remain in effect (meaning this test can be used) for the duration of the COVID-19 declaration under Section 564(b)(1) of the Act, 21 U.S.C. section 360bbb-3(b)(1), unless the authorization is terminated or revoked.  Performed at [facility], [address], [place], [place] [address]     Radiology Reports DG Chest [place] 1 View  Result  Date: 01/29/2021 CLINICAL DATA:  Shortness of breath, COVID-19 EXAM: PORTABLE CHEST 1 VIEW COMPARISON:  Portable exam 0820 hours compared to 01/22/2021 FINDINGS: Normal heart size, mediastinal contours, and pulmonary vascularity. Patchy infiltrates in mid to lower LEFT lung and minimally at RIGHT base consistent with multifocal pneumonia. RIGHT lung infiltrate is perhaps slightly improved since previous exam. No pleural effusion or pneumothorax. Bones unremarkable. IMPRESSION: BILATERAL pulmonary infiltrates consistent with multifocal pneumonia, perhaps minimally improved in RIGHT lung. Electronically Signed   By: Lavonia Dana M.D.   On: 01/29/2021 08:34   DG  Chest Port 1 View  Result Date: 01/23/2021 CLINICAL DATA:  Cough.  Coronavirus infection. EXAM: PORTABLE CHEST 1 VIEW COMPARISON:  11/24/2017 FINDINGS: Heart size is normal. Chronic aortic atherosclerosis and tortuosity as seen previously. Hazy pulmonary infiltrates in the mid and lower lungs consistent with viral pneumonia. No dense consolidation. Chronic elevation of the left hemidiaphragm. No evidence of effusion. No acute bone finding. IMPRESSION: 1. Hazy pulmonary infiltrates in the mid and lower lungs consistent with viral pneumonia. No dense consolidation. 2. Aortic atherosclerosis. Electronically Signed   By: Nelson Chimes M.D.   On: 02/07/2021 22:21

## 2021-01-29 NOTE — ED Notes (Signed)
Pt repositioned from left side to right side with no problems noted. Spo2 ranging 88-93%. RR 28-34.

## 2021-01-29 NOTE — ED Notes (Signed)
Called Resp and CN of [facility] and they understand that patient will be transported on 15 NRB and then placed on dream CPAP once they arrive to the unit.

## 2021-01-29 NOTE — ED Notes (Signed)
resp was called due to sp02 dropping while he is sleep. Pt reports he uses a cpap machine to sleep at night. Resp at bedside placing pt on cpap now.

## 2021-01-29 NOTE — Progress Notes (Signed)
Pt requiring higher oxygen demand then cpap can provide. No cpap will be used at this time.

## 2021-01-30 ENCOUNTER — Inpatient Hospital Stay (HOSPITAL_COMMUNITY): Payer: Medicare Other

## 2021-01-30 DIAGNOSIS — J9601 Acute respiratory failure with hypoxia: Secondary | ICD-10-CM | POA: Diagnosis not present

## 2021-01-30 DIAGNOSIS — U071 COVID-19: Secondary | ICD-10-CM | POA: Diagnosis not present

## 2021-01-30 DIAGNOSIS — I5031 Acute diastolic (congestive) heart failure: Secondary | ICD-10-CM | POA: Diagnosis not present

## 2021-01-30 LAB — CBC WITH DIFFERENTIAL/PLATELET
Abs Immature Granulocytes: 0 10*3/uL (ref 0.00–0.07)
Basophils Absolute: 0 10*3/uL (ref 0.0–0.1)
Basophils Relative: 0 %
Eosinophils Absolute: 0 10*3/uL (ref 0.0–0.5)
Eosinophils Relative: 0 %
HCT: 38 % — ABNORMAL LOW (ref 39.0–52.0)
Hemoglobin: 12.1 g/dL — ABNORMAL LOW (ref 13.0–17.0)
Lymphocytes Relative: 32 %
Lymphs Abs: 0.6 10*3/uL — ABNORMAL LOW (ref 0.7–4.0)
MCH: 29.7 pg (ref 26.0–34.0)
MCHC: 31.8 g/dL (ref 30.0–36.0)
MCV: 93.1 fL (ref 80.0–100.0)
Monocytes Absolute: 0.2 10*3/uL (ref 0.1–1.0)
Monocytes Relative: 10 %
Neutro Abs: 1.2 10*3/uL — ABNORMAL LOW (ref 1.7–7.7)
Neutrophils Relative %: 58 %
Platelets: 143 10*3/uL — ABNORMAL LOW (ref 150–400)
RBC: 4.08 MIL/uL — ABNORMAL LOW (ref 4.22–5.81)
RDW: 13.4 % (ref 11.5–15.5)
WBC: 2 10*3/uL — ABNORMAL LOW (ref 4.0–10.5)
nRBC: 0 % (ref 0.0–0.2)
nRBC: 0 /100 WBC

## 2021-01-30 LAB — COMPREHENSIVE METABOLIC PANEL
ALT: 42 U/L (ref 0–44)
AST: 60 U/L — ABNORMAL HIGH (ref 15–41)
Albumin: 3.4 g/dL — ABNORMAL LOW (ref 3.5–5.0)
Alkaline Phosphatase: 52 U/L (ref 38–126)
Anion gap: 12 (ref 5–15)
BUN: 16 mg/dL (ref 8–23)
CO2: 20 mmol/L — ABNORMAL LOW (ref 22–32)
Calcium: 9.3 mg/dL (ref 8.9–10.3)
Chloride: 102 mmol/L (ref 98–111)
Creatinine, Ser: 1.16 mg/dL (ref 0.61–1.24)
GFR, Estimated: >60 mL/min (ref >60)
Glucose, Bld: 126 mg/dL — ABNORMAL HIGH (ref 70–99)
Potassium: 4.8 mmol/L (ref 3.5–5.1)
Sodium: 134 mmol/L — ABNORMAL LOW (ref 135–145)
Total Bilirubin: 0.6 mg/dL (ref 0.3–1.2)
Total Protein: 6.9 g/dL (ref 6.5–8.1)

## 2021-01-30 LAB — ECHOCARDIOGRAM LIMITED
AR max vel: 2.65 cm2
AV Area VTI: 2.23 cm2
AV Area mean vel: 2.2 cm2
AV Mean grad: 5 mmHg
AV Peak grad: 7.1 mmHg
Ao pk vel: 1.33 m/s
Area-P 1/2: 2.44 cm2
Height: 72 in
S' Lateral: 2.8 cm
Weight: 3795.44 oz

## 2021-01-30 LAB — GLUCOSE, CAPILLARY
Glucose-Capillary: 108 mg/dL — ABNORMAL HIGH (ref 70–99)
Glucose-Capillary: 113 mg/dL — ABNORMAL HIGH (ref 70–99)
Glucose-Capillary: 121 mg/dL — ABNORMAL HIGH (ref 70–99)
Glucose-Capillary: 122 mg/dL — ABNORMAL HIGH (ref 70–99)
Glucose-Capillary: 137 mg/dL — ABNORMAL HIGH (ref 70–99)
Glucose-Capillary: 139 mg/dL — ABNORMAL HIGH (ref 70–99)

## 2021-01-30 LAB — D-DIMER, QUANTITATIVE: D-Dimer, Quant: 1.26 ug/mL-FEU — ABNORMAL HIGH (ref 0.00–0.50)

## 2021-01-30 LAB — MAGNESIUM: Magnesium: 2.1 mg/dL (ref 1.7–2.4)

## 2021-01-30 LAB — BRAIN NATRIURETIC PEPTIDE: B Natriuretic Peptide: 38.4 pg/mL (ref 0.0–100.0)

## 2021-01-30 LAB — C-REACTIVE PROTEIN: CRP: 1.4 mg/dL — ABNORMAL HIGH (ref <1.0)

## 2021-01-30 LAB — PROCALCITONIN: Procalcitonin: <0.1 ng/mL

## 2021-01-30 LAB — MRSA PCR SCREENING: MRSA by PCR: NEGATIVE

## 2021-01-30 MED ORDER — MELATONIN 5 MG PO TABS
5.0000 mg | ORAL_TABLET | Freq: Every day | ORAL | Status: DC
Start: 2021-01-30 — End: 2021-02-14
  Administered 2021-01-30 – 2021-02-13 (×14): 5 mg via ORAL
  Filled 2021-01-30 (×16): qty 1

## 2021-01-30 MED ORDER — INSULIN ASPART 100 UNIT/ML ~~LOC~~ SOLN
0.0000 [IU] | Freq: Three times a day (TID) | SUBCUTANEOUS | Status: DC
  Administered 2021-01-31: 1 [IU] via SUBCUTANEOUS
  Administered 2021-01-31: 2 [IU] via SUBCUTANEOUS
  Administered 2021-01-31 – 2021-02-01 (×3): 1 [IU] via SUBCUTANEOUS
  Administered 2021-02-01: 2 [IU] via SUBCUTANEOUS
  Administered 2021-02-01: 1 [IU] via SUBCUTANEOUS
  Administered 2021-02-01: 2 [IU] via SUBCUTANEOUS
  Administered 2021-02-02 – 2021-02-05 (×11): 1 [IU] via SUBCUTANEOUS
  Administered 2021-02-07: 2 [IU] via SUBCUTANEOUS
  Administered 2021-02-08: 1 [IU] via SUBCUTANEOUS
  Administered 2021-02-09: 2 [IU] via SUBCUTANEOUS
  Administered 2021-02-09 – 2021-02-14 (×11): 1 [IU] via SUBCUTANEOUS

## 2021-01-30 MED ORDER — MELATONIN 5 MG PO TABS: 5.0000 mg | ORAL_TABLET | Freq: Every day | ORAL | Status: DC

## 2021-01-30 NOTE — Progress Notes (Signed)
  Echocardiogram 2D Echocardiogram has been performed.  Kurt Williams 01/30/2021, 12:12 PM

## 2021-01-30 NOTE — Progress Notes (Signed)
PROGRESS NOTE                                                                                                                                                                                                             Patient Demographics:    Kurt Williams, is a 70 y.o. male, DOB - 1951-01-10, VOP:[id number]  Outpatient Primary MD for the patient is Shelda Pal, DO    LOS - 1  Admit date - 01/19/2021    Chief Complaint  Patient presents with  . Covid Positive       Brief Narrative (HPI from H&P) - Kurt Williams is a 70 y.o. male with medical history significant of CAD, OSA on CPAP.  Pt presents to the ED with c/o SOB and cough ongoing for 5-6 days, he is unvaccinated, he was diagnosed with severe hypoxic Resp failure due to Covid PNA.   Subjective:   Patient in bed, appears comfortable, denies any headache, no fever, no chest pain or pressure, mild shortness of breath , no abdominal pain. No focal weakness.   Assessment  & Plan :     1. Acute Hypoxic Resp. Failure due to Acute Covid 19 Viral Pneumonitis during the ongoing 2020 Covid 19 Pandemic - he is unvaccinated and unfortunately has incurred severe parenchymal lung injury due to COVID-19 pneumonia/ARDS.  He is being treated with high-dose IV steroids, Remdesivir along with Actemra.  Also on moderate dose Lovenox overall remains extremely tenuous, will continue to monitor closely.  Encouraged the patient to sit up in chair in the daytime use I-S and flutter valve for pulmonary toiletry and then prone in bed when at night.  Will advance activity and titrate down oxygen as possible.    SpO2: 100 % O2 Flow Rate (L/min): 30 L/min FiO2 (%): 70 %  Recent Labs  Lab 02/05/2021 2209 01/24/2021 2220 01/29/21 0311 01/29/21 0624 01/29/21 0741 01/30/21 0141 01/30/21 0616  WBC 5.4  --   --  4.0  --  2.0*  --   HGB 12.3*  --   --  12.0*  --  12.1*  --   HCT 37.8*   --   --  37.7*  --  38.0*  --   PLT 114*  --   --  127*  --  143*  --   CRP  1.8*  --   --  2.0*  --   --  1.4*  BNP  --   --   --   --  43.4 38.4  --   DDIMER 0.87*  --   --  1.55*  --   --  1.26*  PROCALCITON <0.10  --   --   --   --  <0.10  --   AST 58*  --   --  62*  --  60*  --   ALT 33  --   --  37  --  42  --   ALKPHOS 49  --   --  55  --  52  --   BILITOT 0.6  --   --  0.6  --  0.6  --   ALBUMIN 3.7  --   --  3.3*  --  3.4*  --   LATICACIDVEN 2.3*  --  2.4*  --   --   --   --   SARSCOV2NAA  --  POSITIVE*  --   --   --   --   --     2.  High D-dimer due to intense inflammation.  On moderate dose Lovenox, negative leg ultrasound.  3.  CAD.  Chest pain-free currently on aspirin and statin for secondary prevention.  Will check echocardiogram to evaluate EF and Dobies motion. EKG Nonacute.  4. AKI on ? CKD 3 A.  Baseline creatinine around 1.2 to 1.5.  Monitor.  5.  OSA.  CPAP nightly - ordered, if he can tolerate with max Fio2.      Condition - Extremely Guarded  Family Communication  : daughter and wife [phone] on 01/29/21 in detail, updated on tenuous condition and poor prognosis. Daughter updated again 01/30/2021  Code Status :  Full  Consults  :  None  PUD Prophylaxis : PPI   Procedures  :     Echocardiogram.  Leg ultrasound - No DVT      Disposition Plan  :    Status is: Inpatient  Remains inpatient appropriate because:IV treatments appropriate due to intensity of illness or inability to take PO   Dispo: The patient is from: Home              Anticipated d/c is to: Home              Anticipated d/c date is: > 3 days              Patient currently is not medically stable to d/c.   Difficult to place patient No  DVT Prophylaxis  :  Lovenox    Lab Results  Component Value Date   PLT 143 (L) 01/30/2021    Diet :  Diet Order            Diet Heart Room service appropriate? Yes; Fluid consistency: Thin  Diet effective now                   Inpatient Medications  Scheduled Meds: . aspirin  81 mg Oral Daily  . enoxaparin (LOVENOX) injection  55 mg Subcutaneous Q24H  . insulin aspart  0-9 Units Subcutaneous Q4H  . melatonin  5 mg Oral QHS  . methylPREDNISolone (SOLU-MEDROL) injection  60 mg Intravenous Q12H  . pantoprazole  40 mg Oral Daily  . rosuvastatin  5 mg Oral Daily  . tamsulosin  0.4 mg Oral Daily   Continuous Infusions: . remdesivir 100 mg in  NS 100 mL 100 mg (01/30/21 0953)   PRN Meds:.acetaminophen, nitroGLYCERIN, [DISCONTINUED] ondansetron **OR** ondansetron (ZOFRAN) IV  Antibiotics  :    Anti-infectives (From admission, onward)   Start     Dose/Rate Route Frequency Ordered Stop   01/30/21 1800  remdesivir 100 mg in sodium chloride 0.9 % 100 mL IVPB  Status:  Discontinued       "Followed by" Linked Group Details   100 mg 200 mL/hr over 30 Minutes Intravenous Daily 02/02/2021 2311 01/29/21 1041   01/29/21 1600  remdesivir 100 mg in sodium chloride 0.9 % 100 mL IVPB       "Followed by" Linked Group Details   100 mg 200 mL/hr over 30 Minutes Intravenous Daily 01/29/21 1041 02/02/21 0959   01/20/2021 2339  remdesivir 100 mg in sodium chloride 0.9 % 100 mL IVPB       "Followed by" Linked Group Details   100 mg 200 mL/hr over 30 Minutes Intravenous  Once 01/30/2021 2311 01/29/21 0028   01/26/2021 2315  remdesivir 100 mg in sodium chloride 0.9 % 100 mL IVPB       "Followed by" Linked Group Details   100 mg 200 mL/hr over 30 Minutes Intravenous  Once 01/19/2021 2311 02/05/2021 2353       Time Spent in minutes  30   Lala Lund M.D on 01/30/2021 at 9:57 AM  To page go to [email or web address]   [facility] -  Office  [phone]    See all Orders from today for further details    Objective:   Vitals:   01/29/21 2052 01/30/21 0329 01/30/21 0440 01/30/21 0923  BP: 124/76  103/65   Pulse: (!) 56 63 60   Resp: 18 20 19    Temp:   97.7 F (36.5 C)   TempSrc:   Oral   SpO2: 99% 100% 99% 100%  Weight:       Height:        Wt Readings from Last 3 Encounters:  01/29/21 107.6 kg  01/16/2021 108.9 kg  01/09/21 114.8 kg     Intake/Output Summary (Last 24 hours) at 01/30/2021 0957 Last data filed at 01/30/2021 0444 Gross per 24 hour  Intake 460 ml  Output 1805 ml  Net -1345 ml     Physical Exam  Awake Alert, No new F.N deficits, Normal affect [place].AT,PERRAL Supple Neck,No JVD, No cervical lymphadenopathy appriciated.  Symmetrical Chest Marshman movement, Good air movement bilaterally, CTAB RRR,No Gallops, Rubs or new Murmurs, No Parasternal Heave +ve B.Sounds, Abd Soft, No tenderness, No organomegaly appriciated, No rebound - guarding or rigidity. No Cyanosis, Clubbing or edema, No new Rash or bruise     Data Review:    CBC Recent Labs  Lab 01/23/2021 2209 01/29/21 0624 01/30/21 0141  WBC 5.4 4.0 2.0*  HGB 12.3* 12.0* 12.1*  HCT 37.8* 37.7* 38.0*  PLT 114* 127* 143*  MCV 92.0 92.0 93.1  MCH 29.9 29.3 29.7  MCHC 32.5 31.8 31.8  RDW 13.3 13.4 13.4  LYMPHSABS 0.5* 0.4* 0.6*  MONOABS 0.6 0.3 0.2  EOSABS 0.0 0.0 0.0  BASOSABS 0.0 0.0 0.0    Recent Labs  Lab 01/24/2021 2209 01/29/21 0311 01/29/21 0624 01/29/21 0741 01/30/21 0141 01/30/21 0616  NA 132*  --  136  --  134*  --   K 4.2  --  4.8  --  4.8  --   CL 99  --  102  --  102  --   CO2 23  --  24  --  20*  --   GLUCOSE 149*  --  141*  --  126*  --   BUN 14  --  11  --  16  --   CREATININE 1.49*  --  1.43*  --  1.16  --   CALCIUM 9.0  --  9.4  --  9.3  --   AST 58*  --  62*  --  60*  --   ALT 33  --  37  --  42  --   ALKPHOS 49  --  55  --  52  --   BILITOT 0.6  --  0.6  --  0.6  --   ALBUMIN 3.7  --  3.3*  --  3.4*  --   MG  --   --   --  2.0 2.1  --   CRP 1.8*  --  2.0*  --   --  1.4*  DDIMER 0.87*  --  1.55*  --   --  1.26*  PROCALCITON <0.10  --   --   --  <0.10  --   LATICACIDVEN 2.3* 2.4*  --   --   --   --   HGBA1C  --   --  6.0*  --   --   --   BNP  --   --   --  43.4 38.4  --      ------------------------------------------------------------------------------------------------------------------ Recent Labs    01/26/2021 2209  TRIG 93    Lab Results  Component Value Date   HGBA1C 6.0 (H) 01/29/2021   ------------------------------------------------------------------------------------------------------------------ No results for input(s): TSH, T4TOTAL, T3FREE, THYROIDAB in the last 72 hours.  Invalid input(s): FREET3  Cardiac Enzymes No results for input(s): CKMB, TROPONINI, MYOGLOBIN in the last 168 hours.  Invalid input(s): CK ------------------------------------------------------------------------------------------------------------------    Component Value Date/Time   BNP 38.4 01/30/2021 0141    Micro Results Recent Results (from the past 240 hour(s))  Resp Panel by RT-PCR (Flu A&B, Covid) Nasopharyngeal Swab     Status: Abnormal   Collection Time: 02/08/2021 10:20 PM   Specimen: Nasopharyngeal Swab; Nasopharyngeal(NP) swabs in vial transport medium  Result Value Ref Range Status   SARS Coronavirus 2 by RT PCR POSITIVE (A) NEGATIVE Final    Comment: RESULT CALLED TO, READ BACK BY AND VERIFIED WITH: ALFRED DILLARD RN AT 6237 BY AV CALLED ON 01/16/2021 BY A VASSER (NOTE) SARS-CoV-2 target nucleic acids are DETECTED.  The SARS-CoV-2 RNA is generally detectable in upper respiratory specimens during the acute phase of infection. Positive results are indicative of the presence of the identified virus, but do not rule out bacterial infection or co-infection with other pathogens not detected by the test. Clinical correlation with patient history and other diagnostic information is necessary to determine patient infection status. The expected result is Negative.  Fact Sheet for Patients: EntrepreneurPulse.com.au  Fact Sheet for Healthcare Providers: IncredibleEmployment.be  This test is not yet approved or cleared  by the [place] FDA and  has been authorized for detection and/or diagnosis of SARS-CoV-2 by FDA under an Emergency Use Authorization (EUA).  This EUA will remain in ef fect (meaning this test can be used) for the duration of  the COVID-19 declaration under Section 564(b)(1) of the Act, 21 U.S.C. section 360bbb-3(b)(1), unless the authorization is terminated or revoked sooner.     Influenza A by PCR NEGATIVE NEGATIVE Final   Influenza B by PCR NEGATIVE NEGATIVE Final    Comment: (  NOTE) The Xpert Xpress SARS-CoV-2/FLU/RSV plus assay is intended as an aid in the diagnosis of influenza from Nasopharyngeal swab specimens and should not be used as a sole basis for treatment. Nasal washings and aspirates are unacceptable for Xpert Xpress SARS-CoV-2/FLU/RSV testing.  Fact Sheet for Patients: EntrepreneurPulse.com.au  Fact Sheet for Healthcare Providers: IncredibleEmployment.be  This test is not yet approved or cleared by the [place] FDA and has been authorized for detection and/or diagnosis of SARS-CoV-2 by FDA under an Emergency Use Authorization (EUA). This EUA will remain in effect (meaning this test can be used) for the duration of the COVID-19 declaration under Section 564(b)(1) of the Act, 21 U.S.C. section 360bbb-3(b)(1), unless the authorization is terminated or revoked.  Performed at [facility], [address] [place], [place] [address]     Radiology Reports DG Chest [place] 1 View  Result Date: 01/29/2021 CLINICAL DATA:  Shortness of breath, COVID-19 EXAM: PORTABLE CHEST 1 VIEW COMPARISON:  Portable exam 0820 hours compared to 02/06/2021 FINDINGS: Normal heart size, mediastinal contours, and pulmonary vascularity. Patchy infiltrates in mid to lower LEFT lung and minimally at RIGHT base consistent with multifocal pneumonia. RIGHT lung infiltrate is perhaps slightly improved since previous exam. No pleural effusion or  pneumothorax. Bones unremarkable. IMPRESSION: BILATERAL pulmonary infiltrates consistent with multifocal pneumonia, perhaps minimally improved in RIGHT lung. Electronically Signed   By: Lavonia Dana M.D.   On: 01/29/2021 08:34   DG Chest Port 1 View  Result Date: 01/17/2021 CLINICAL DATA:  Cough.  Coronavirus infection. EXAM: PORTABLE CHEST 1 VIEW COMPARISON:  11/24/2017 FINDINGS: Heart size is normal. Chronic aortic atherosclerosis and tortuosity as seen previously. Hazy pulmonary infiltrates in the mid and lower lungs consistent with viral pneumonia. No dense consolidation. Chronic elevation of the left hemidiaphragm. No evidence of effusion. No acute bone finding. IMPRESSION: 1. Hazy pulmonary infiltrates in the mid and lower lungs consistent with viral pneumonia. No dense consolidation. 2. Aortic atherosclerosis. Electronically Signed   By: Nelson Chimes M.D.   On: 01/14/2021 22:21   VAS [place] LOWER EXTREMITY VENOUS (DVT)  Result Date: 01/29/2021  Lower Venous DVT Study Indications: Covid, d-dimer.  Anticoagulation: Lovenox. Comparison Study: No prior studies. Performing Technologist: Darlin Coco RDMS  Examination Guidelines: A complete evaluation includes B-mode imaging, spectral Doppler, color Doppler, and power Doppler as needed of all accessible portions of each vessel. Bilateral testing is considered an integral part of a complete examination. Limited examinations for reoccurring indications may be performed as noted. The reflux portion of the exam is performed with the patient in reverse Trendelenburg.  +---------+---------------+---------+-----------+----------+-------------------+ RIGHT    CompressibilityPhasicitySpontaneityPropertiesThrombus Aging      +---------+---------------+---------+-----------+----------+-------------------+ CFV      Full           Yes      Yes                                      +---------+---------------+---------+-----------+----------+-------------------+  SFJ      Full                                                             +---------+---------------+---------+-----------+----------+-------------------+ FV Prox  Full                                                             +---------+---------------+---------+-----------+----------+-------------------+  FV Mid   Full                                                             +---------+---------------+---------+-----------+----------+-------------------+ FV DistalFull                                                             +---------+---------------+---------+-----------+----------+-------------------+ PFV      Full                                                             +---------+---------------+---------+-----------+----------+-------------------+ POP      Full           No       Yes                  Rouleaux flow noted +---------+---------------+---------+-----------+----------+-------------------+ PTV      Full                                                             +---------+---------------+---------+-----------+----------+-------------------+ PERO     Full                                                             +---------+---------------+---------+-----------+----------+-------------------+   +---------+---------------+---------+-----------+----------+-------------------+ LEFT     CompressibilityPhasicitySpontaneityPropertiesThrombus Aging      +---------+---------------+---------+-----------+----------+-------------------+ CFV      Full           Yes      Yes                                      +---------+---------------+---------+-----------+----------+-------------------+ SFJ      Full                                                             +---------+---------------+---------+-----------+----------+-------------------+ FV Prox  Full                                                              +---------+---------------+---------+-----------+----------+-------------------+ FV Mid   Full                                                             +---------+---------------+---------+-----------+----------+-------------------+  FV DistalFull                                                             +---------+---------------+---------+-----------+----------+-------------------+ PFV      Full                                                             +---------+---------------+---------+-----------+----------+-------------------+ POP      Full           No       Yes                  Rouleaux flow noted +---------+---------------+---------+-----------+----------+-------------------+ PTV      Full                                                             +---------+---------------+---------+-----------+----------+-------------------+ PERO     Full                                                             +---------+---------------+---------+-----------+----------+-------------------+     Summary: RIGHT: - There is no evidence of deep vein thrombosis in the lower extremity.  - No cystic structure found in the popliteal fossa.  LEFT: - There is no evidence of deep vein thrombosis in the lower extremity.  - No cystic structure found in the popliteal fossa.  *See table(s) above for measurements and observations.    Preliminary

## 2021-01-30 NOTE — Evaluation (Signed)
Physical Therapy Evaluation Patient Details Name: Kurt Williams MRN: [id number] DOB: 1951/02/13 Today's Date: 01/30/2021   History of Present Illness  Pt is a 70 y.o. male admitted 02/05/2021 with SOB and cough; workup for acute hypoxic respiratory failure due to COVID-19 pneumonitis; requiring heated HFNC. No evidence of DVT. PMH includes CAD, OSA on CPAP.    Clinical Impression  Pt presents with an overall decrease in functional mobility secondary to above. PTA, pt independent, active, and lives with wife. Initiated education re: current condition, O2 needs, activity recommendations, positioning, therex, energy conservation, and importance of mobility. Today, pt able to perform multiple bouts of standing exercise without assist; supervision for safety/technique and vitals monitoring. Difficulty getting reliable pulse ox reading; SpO2 >/78% on 30L O2 HHFNC at 60% FiO2; pt recovering well with seated rest breaks. Pt would benefit from continued acute PT services to maximize functional mobility and independence prior to d/c home.    Follow Up Recommendations No PT follow up;Supervision - Intermittent (pending progress)    Equipment Recommendations   (TBD)    Recommendations for Other Services       Precautions / Restrictions Precautions Precautions: Fall;Other (comment) Precaution Comments: Watch SpO2 on HHFNC Restrictions Weight Bearing Restrictions: No      Mobility  Bed Mobility               General bed mobility comments: Pt received sitting in recliner    Transfers Overall transfer level: Independent Equipment used: None Transfers: Sit to/from Stand              Ambulation/Gait Ambulation/Gait assistance: Supervision   Assistive device: None   Gait velocity: Decreased   General Gait Details: Multiple bouts of marching in place, steps forwards/backwards within confines of HHFNC/lines; pt with good management of O2/lines, good awareness of activity tolerance  and need for seated rest breaks  Stairs            Wheelchair Mobility    Modified Rankin (Stroke Patients Only)       Balance     Sitting balance-Leahy Scale: Good       Standing balance-Leahy Scale: Good Standing balance comment: Able to perform standing therex without UE support                             Pertinent Vitals/Pain Pain Assessment: No/denies pain    Home Living Family/patient expects to be discharged to:: Private residence Living Arrangements: Spouse/significant other Available Help at Discharge: Family;Available 24 hours/day Type of Home: House Home Access: Stairs to enter   [employer] of Steps: 1 small step from the garage Home Layout: Two level Home Equipment: Toilet riser;Tub bench;Other (comment) (stair lift)      Prior Function Level of Independence: Independent         Comments: Pt is retired Scientist, research (life sciences); 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 Active and exercises a few days a week     Hand Dominance   Dominant Hand: Right    Extremity/Trunk Assessment   Upper Extremity Assessment Upper Extremity Assessment: Overall WFL for tasks assessed    Lower Extremity Assessment Lower Extremity Assessment: Overall WFL for tasks assessed    Cervical / Trunk Assessment Cervical / Trunk Assessment: Normal  Communication   Communication: No difficulties  Cognition Arousal/Alertness: Awake/alert Behavior During Therapy: WFL for tasks assessed/performed Overall Cognitive Status: Within Functional Limits for tasks assessed  General Comments General comments (skin integrity, edema, etc.): Difficulty getting reliable pulse ox reading with activity; pt on 30L O2 HHFNC at 70% FiO2, HR 70s-90s    Exercises Other Exercises Other Exercises: BLE therex HEP handout provided (Access Code [email or web address]) and pt performed - seated LAQ, sit<>stands, standing marching, standing  calf raises - seated rest breaks between 5-10 reps of each exercise   Assessment/Plan    PT Assessment Patient needs continued PT services  PT Problem List Decreased activity tolerance;Decreased mobility;Decreased knowledge of precautions;Cardiopulmonary status limiting activity       PT Treatment Interventions DME instruction;Gait training;Stair training;Functional mobility training;Therapeutic activities;Therapeutic exercise;Balance training;Patient/family education    PT Goals (Current goals can be found in the Care Plan section)  Acute Rehab PT Goals Patient Stated Goal: to be able to breathe without O2 PT Goal Formulation: With patient Time For Goal Achievement: 02/13/21 Potential to Achieve Goals: Good    Frequency Min 3X/week   Barriers to discharge        Co-evaluation               AM-PAC PT "6 Clicks" Mobility  Outcome Measure Help needed turning from your back to your side while in a flat bed without using bedrails?: None Help needed moving from lying on your back to sitting on the side of a flat bed without using bedrails?: None Help needed moving to and from a bed to a chair (including a wheelchair)?: None Help needed standing up from a chair using your arms (e.g., wheelchair or bedside chair)?: A Little Help needed to walk in hospital room?: A Little Help needed climbing 3-5 steps with a railing? : A Little 6 Click Score: 21    End of Session Equipment Utilized During Treatment: Oxygen Activity Tolerance: Patient tolerated treatment well Patient left: in chair;with call bell/phone within reach Nurse Communication: Mobility status PT Visit Diagnosis: Other abnormalities of gait and mobility (R26.89)    Time: 5956-3875 PT Time Calculation (min) (ACUTE ONLY): 27 min   Charges:   PT Evaluation $PT Eval Moderate Complexity: 1 Mod PT Treatments $Therapeutic Exercise: 8-22 mins      Mabeline Caras, PT, DPT Acute Rehabilitation Services  Pager  [phone] Office [place] 01/30/2021, 4:36 PM

## 2021-01-30 NOTE — Plan of Care (Signed)

## 2021-01-30 NOTE — Evaluation (Signed)
Occupational Therapy Evaluation Patient Details Name: Kurt Williams MRN: [id number] DOB: [birth year] Today's Date: 01/30/2021    History of Present Illness Pt is a 70 y.o. male admitted 02/02/2021 with SOB and cough; workup for acute hypoxic respiratory failure due to COVID-19 pneumonitis; requiring heated HFNC. No evidence of DVT. PMH includes CAD, OSA on CPAP.   Clinical Impression   Pt admitted with above. He demonstrates the below listed deficits and will benefit from continued OT to maximize safety and independence with BADLs.  Pt presents to OT with decreased activity tolerance.  He currently is able to perform ADLs and functional mobility at supervision level, but Sp02 decreased 76-78% with activty on 30L HHflo 02, 70% Fi02, but rebounded into low 90s within 30-45 seconds.  He reports he lives with his wife, and was fully independent PTA.  Will follow acutely.       Follow Up Recommendations  No OT follow up;Supervision - Intermittent    Equipment Recommendations  None recommended by OT    Recommendations for Other Services       Precautions / Restrictions        Mobility Bed Mobility               General bed mobility comments: Pt sitting EOB using lfuter valve    Transfers Overall transfer level: Needs assistance Equipment used: None Transfers: Sit to/from Stand;Stand Pivot Transfers Sit to Stand: Supervision Stand pivot transfers: Supervision            Balance Overall balance assessment: No apparent balance deficits (not formally assessed)                                         ADL either performed or assessed with clinical judgement   ADL Overall ADL's : Needs assistance/impaired Eating/Feeding: Independent   Grooming: Wash/dry hands;Wash/dry face;Oral care;Brushing hair;Set up;Sitting   Upper Body Bathing: Set up;Sitting   Lower Body Bathing: Supervison/ safety;Sit to/from stand   Upper Body Dressing : Set up;Sitting    Lower Body Dressing: Supervision/safety;Sit to/from stand   Toilet Transfer: Supervision/safety;Stand-pivot;BSC   Toileting- Water quality scientist and Hygiene: Supervision/safety;Sit to/from stand       Functional mobility during ADLs: Supervision/safety General ADL Comments: Sp02 drops to 76-78% with activity with pt on 30L 02, 70% Fi02.  He rebounded quickly to the low 90s within 30-45 seconds with rest break and pursed lip breathing.     Vision Patient Visual Report: No change from baseline       Perception     Praxis      Pertinent Vitals/Pain Pain Assessment: No/denies pain     Hand Dominance Right   Extremity/Trunk Assessment Upper Extremity Assessment Upper Extremity Assessment: Overall WFL for tasks assessed   Lower Extremity Assessment Lower Extremity Assessment: Defer to PT evaluation   Cervical / Trunk Assessment Cervical / Trunk Assessment: Normal   Communication Communication Communication: No difficulties   Cognition Arousal/Alertness: Awake/alert Behavior During Therapy: WFL for tasks assessed/performed Overall Cognitive Status: Within Functional Limits for tasks assessed                                     General Comments  Sp0276-78% during activity with pt on 30L, 70% Fi02, but recovers quickly to low 90s with pursed lip breathing  Exercises Exercises: Other exercises Other Exercises Other Exercises: Pt provided with blue theraband.  He was instrcuted in initial HEP and performed 10 reps horizontal abductionand diagonals Lt and Rt x 10.  Encouraged him to perform 4-6x/day. Other Exercises: Pt instructed on IS use Other Exercises: Pt instructed in pursed lip breathing   Shoulder Instructions      Home Living Family/patient expects to be discharged to:: Private residence Living Arrangements: Spouse/significant other Available Help at Discharge: Family;Available 24 hours/day Type of Home: House Home Access: Stairs to  enter [employer] of Steps: 1 small step from the garage   Home Layout: Two level Alternate Level Stairs-Number of Steps: full flight - has stair lift   Bathroom Shower/Tub: Teacher, early years/pre: Standard     Home Equipment: Toilet riser;Tub bench;Other (comment) (stair lift)          Prior Functioning/Environment Level of Independence: Independent        Comments: Pt is a disabled vet.  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  Is very active        OT Problem List: Decreased knowledge of use of DME or AE;Cardiopulmonary status limiting activity;Decreased activity tolerance      OT Treatment/Interventions: Self-care/ADL training;Therapeutic exercise;DME and/or AE instruction;Therapeutic activities;Patient/family education;Balance training    OT Goals(Current goals can be found in the care plan section) Acute Rehab OT Goals Patient Stated Goal: to be able to breathe without 02 OT Goal Formulation: With patient Time For Goal Achievement: 02/13/21 Potential to Achieve Goals: Good ADL Goals Pt/caregiver will Perform Home Exercise Program: Increased strength;Right Upper extremity;Left upper extremity;Independently;With written HEP provided;With theraband Additional ADL Goal #1: Pt will independently incorporate energy conservation strategies during ADL tasks Additional ADL Goal #2: Pt will complete ADLs mod I with VSS and no more than 3 rest breaks  OT Frequency: Min 2X/week   Barriers to D/C:            Co-evaluation              AM-PAC OT "6 Clicks" Daily Activity     Outcome Measure Help from another person eating meals?: None Help from another person taking care of personal grooming?: A Little Help from another person toileting, which includes using toliet, bedpan, or urinal?: A Little Help from another person bathing (including washing, rinsing, drying)?: A Little Help from another person to put on and taking off regular upper body  clothing?: A Little Help from another person to put on and taking off regular lower body clothing?: A Little 6 Click Score: 19   End of Session Equipment Utilized During Treatment: Oxygen Nurse Communication: Mobility status  Activity Tolerance: Treatment limited secondary to medical complications (Comment) Patient left: in chair;with call bell/phone within reach  OT Visit Diagnosis: Other (comment) (decreasd activity tolerance)                Time: 9323-5573 OT Time Calculation (min): 41 min Charges:  OT General Charges $OT Visit: 1 Visit OT Evaluation $OT Eval Moderate Complexity: 1 Mod OT Treatments $Therapeutic Activity: 23-37 mins  [name]., OTR/L Acute Rehabilitation Services Pager [phone] Office [phone]   [name] 01/30/2021, 1:55 PM

## 2021-01-31 ENCOUNTER — Inpatient Hospital Stay (HOSPITAL_COMMUNITY): Payer: Medicare Other

## 2021-01-31 DIAGNOSIS — J9601 Acute respiratory failure with hypoxia: Secondary | ICD-10-CM | POA: Diagnosis not present

## 2021-01-31 DIAGNOSIS — U071 COVID-19: Secondary | ICD-10-CM | POA: Diagnosis not present

## 2021-01-31 LAB — CBC WITH DIFFERENTIAL/PLATELET
Abs Immature Granulocytes: 0.02 10*3/uL (ref 0.00–0.07)
Basophils Absolute: 0 10*3/uL (ref 0.0–0.1)
Basophils Relative: 0 %
Eosinophils Absolute: 0 10*3/uL (ref 0.0–0.5)
Eosinophils Relative: 0 %
HCT: 36.6 % — ABNORMAL LOW (ref 39.0–52.0)
Hemoglobin: 11.7 g/dL — ABNORMAL LOW (ref 13.0–17.0)
Immature Granulocytes: 1 %
Lymphocytes Relative: 19 %
Lymphs Abs: 0.7 10*3/uL (ref 0.7–4.0)
MCH: 29.5 pg (ref 26.0–34.0)
MCHC: 32 g/dL (ref 30.0–36.0)
MCV: 92.2 fL (ref 80.0–100.0)
Monocytes Absolute: 0.6 10*3/uL (ref 0.1–1.0)
Monocytes Relative: 16 %
Neutro Abs: 2.4 10*3/uL (ref 1.7–7.7)
Neutrophils Relative %: 64 %
Platelets: 179 10*3/uL (ref 150–400)
RBC: 3.97 MIL/uL — ABNORMAL LOW (ref 4.22–5.81)
RDW: 13.1 % (ref 11.5–15.5)
WBC: 3.8 10*3/uL — ABNORMAL LOW (ref 4.0–10.5)
nRBC: 0 % (ref 0.0–0.2)

## 2021-01-31 LAB — COMPREHENSIVE METABOLIC PANEL
ALT: 53 U/L — ABNORMAL HIGH (ref 0–44)
AST: 62 U/L — ABNORMAL HIGH (ref 15–41)
Albumin: 3 g/dL — ABNORMAL LOW (ref 3.5–5.0)
Alkaline Phosphatase: 49 U/L (ref 38–126)
Anion gap: 7 (ref 5–15)
BUN: 22 mg/dL (ref 8–23)
CO2: 26 mmol/L (ref 22–32)
Calcium: 9.2 mg/dL (ref 8.9–10.3)
Chloride: 102 mmol/L (ref 98–111)
Creatinine, Ser: 1.44 mg/dL — ABNORMAL HIGH (ref 0.61–1.24)
GFR, Estimated: 52 mL/min — ABNORMAL LOW (ref >60)
Glucose, Bld: 141 mg/dL — ABNORMAL HIGH (ref 70–99)
Potassium: 4.9 mmol/L (ref 3.5–5.1)
Sodium: 135 mmol/L (ref 135–145)
Total Bilirubin: 0.8 mg/dL (ref 0.3–1.2)
Total Protein: 6.4 g/dL — ABNORMAL LOW (ref 6.5–8.1)

## 2021-01-31 LAB — GLUCOSE, CAPILLARY
Glucose-Capillary: 125 mg/dL — ABNORMAL HIGH (ref 70–99)
Glucose-Capillary: 132 mg/dL — ABNORMAL HIGH (ref 70–99)
Glucose-Capillary: 133 mg/dL — ABNORMAL HIGH (ref 70–99)
Glucose-Capillary: 181 mg/dL — ABNORMAL HIGH (ref 70–99)

## 2021-01-31 LAB — D-DIMER, QUANTITATIVE: D-Dimer, Quant: 0.96 ug/mL-FEU — ABNORMAL HIGH (ref 0.00–0.50)

## 2021-01-31 LAB — MAGNESIUM: Magnesium: 2.3 mg/dL (ref 1.7–2.4)

## 2021-01-31 LAB — BRAIN NATRIURETIC PEPTIDE: B Natriuretic Peptide: 23.2 pg/mL (ref 0.0–100.0)

## 2021-01-31 LAB — C-REACTIVE PROTEIN: CRP: 0.9 mg/dL (ref <1.0)

## 2021-01-31 LAB — PROCALCITONIN: Procalcitonin: <0.1 ng/mL

## 2021-01-31 NOTE — Progress Notes (Signed)
PROGRESS NOTE                                                                                                                                                                                                             Patient Demographics:    Kurt Williams, is a 70 y.o. male, DOB - November 05, 1951, MSX:[id number]  Outpatient Primary MD for the patient is Shelda Pal, DO    LOS - 2  Admit date - 01/17/2021    Chief Complaint  Patient presents with  . Covid Positive       Brief Narrative (HPI from H&P) - Kurt Williams is a 70 y.o. male with medical history significant of CAD, OSA on CPAP.  Pt presents to the ED with c/o SOB and cough ongoing for 5-6 days, he is unvaccinated, he was diagnosed with severe hypoxic Resp failure due to Covid PNA.   Subjective:   Patient in bed, appears comfortable, denies any headache, no fever, no chest pain or pressure, improved shortness of breath , no abdominal pain. No focal weakness.   Assessment  & Plan :     1. Acute Hypoxic Resp. Failure due to Acute Covid 19 Viral Pneumonitis during the ongoing 2020 Covid 19 Pandemic - he is unvaccinated and unfortunately has incurred severe parenchymal lung injury due to COVID-19 pneumonia/ARDS.  He is being treated with high-dose IV steroids, Remdesivir along with Actemra.  Also on moderate dose Lovenox overall remains extremely tenuous, will continue to monitor closely.  Encouraged the patient to sit up in chair in the daytime use I-S and flutter valve for pulmonary toiletry and then prone in bed when at night.  Will advance activity and titrate down oxygen as possible.    SpO2: 96 % O2 Flow Rate (L/min): (S) 20 L/min FiO2 (%): (S) 50 %  Recent Labs  Lab 01/21/2021 2209 01/22/2021 2220 01/29/21 0311 01/29/21 0624 01/29/21 0741 01/30/21 0141 01/30/21 0616 01/31/21 0330 01/31/21 0331  WBC 5.4  --   --  4.0  --  2.0*  --  3.8*  --   HGB  12.3*  --   --  12.0*  --  12.1*  --  11.7*  --   HCT 37.8*  --   --  37.7*  --  38.0*  --  36.6*  --  PLT 114*  --   --  127*  --  143*  --  179  --   CRP 1.8*  --   --  2.0*  --   --  1.4* 0.9  --   BNP  --   --   --   --  43.4 38.4  --   --  23.2  DDIMER 0.87*  --   --  1.55*  --   --  1.26* 0.96*  --   PROCALCITON <0.10  --   --   --   --  <0.10  --  <0.10  --   AST 58*  --   --  62*  --  60*  --  62*  --   ALT 33  --   --  37  --  42  --  53*  --   ALKPHOS 49  --   --  55  --  52  --  49  --   BILITOT 0.6  --   --  0.6  --  0.6  --  0.8  --   ALBUMIN 3.7  --   --  3.3*  --  3.4*  --  3.0*  --   LATICACIDVEN 2.3*  --  2.4*  --   --   --   --   --   --   SARSCOV2NAA  --  POSITIVE*  --   --   --   --   --   --   --     2.  High D-dimer due to intense inflammation.  On moderate dose Lovenox, negative leg ultrasound.  3.  CAD.  Chest pain-free currently on aspirin and statin for secondary prevention.  Stable echocardiogram EF 60%, no WMA. EKG Nonacute.  4. AKI on ? CKD 3 A.  Baseline creatinine around 1.2 to 1.5.  Monitor.  5.  OSA.  CPAP nightly - ordered, if he can tolerate with max Fio2.      Condition - Extremely Guarded  Family Communication  : Daughter and wife [phone] on 01/29/21 in detail, updated on tenuous condition and poor prognosis. Daughter updated again 01/30/2021, 01/31/21  Code Status :  Full  Consults  :  None  PUD Prophylaxis : PPI   Procedures  :     Echocardiogram - 1. Left ventricular ejection fraction, by estimation, is 60 to 65%. The left ventricle has normal function. The left ventricle has no regional Zettler motion abnormalities. There is mild left ventricular hypertrophy. Left ventricular diastolic parameters are consistent with Grade I diastolic dysfunction (impaired relaxation).  2. Right ventricular systolic function is normal. The right ventricular size is normal.  3. The mitral valve is normal in structure. No evidence of mitral valve  regurgitation. No evidence of mitral stenosis.  4. The aortic valve is normal in structure. Aortic valve regurgitation is not visualized. No aortic stenosis is present.  5. The inferior vena cava is normal in size with greater than 50% respiratory variability, suggesting right atrial pressure of 3 mmHg.  Leg ultrasound - No DVT      Disposition Plan  :    Status is: Inpatient  Remains inpatient appropriate because:IV treatments appropriate due to intensity of illness or inability to take PO  Dispo: The patient is from: Home              Anticipated d/c is to: Home  Anticipated d/c date is: > 3 days              Patient currently is not medically stable to d/c.   Difficult to place patient No  DVT Prophylaxis  :  Lovenox    Lab Results  Component Value Date   PLT 179 01/31/2021    Diet :   Diet Order            Diet Heart Room service appropriate? Yes; Fluid consistency: Thin  Diet effective now                  Inpatient Medications  Scheduled Meds:  . aspirin  81 mg Oral Daily  . enoxaparin (LOVENOX) injection  55 mg Subcutaneous Q24H  . insulin aspart  0-9 Units Subcutaneous TID AC & HS  . melatonin  5 mg Oral QHS  . methylPREDNISolone (SOLU-MEDROL) injection  60 mg Intravenous Q12H  . pantoprazole  40 mg Oral Daily  . rosuvastatin  5 mg Oral Daily  . tamsulosin  0.4 mg Oral Daily   Continuous Infusions: . remdesivir 100 mg in NS 100 mL 100 mg (01/31/21 1012)   PRN Meds:.acetaminophen, nitroGLYCERIN, [DISCONTINUED] ondansetron **OR** ondansetron (ZOFRAN) IV  Antibiotics  :    Anti-infectives (From admission, onward)   Start     Dose/Rate Route Frequency Ordered Stop   01/30/21 1800  remdesivir 100 mg in sodium chloride 0.9 % 100 mL IVPB  Status:  Discontinued       "Followed by" Linked Group Details   100 mg 200 mL/hr over 30 Minutes Intravenous Daily 02/05/2021 2311 01/29/21 1041   01/29/21 1600  remdesivir 100 mg in sodium chloride 0.9 % 100  mL IVPB       "Followed by" Linked Group Details   100 mg 200 mL/hr over 30 Minutes Intravenous Daily 01/29/21 1041 02/02/21 0959   01/20/2021 2339  remdesivir 100 mg in sodium chloride 0.9 % 100 mL IVPB       "Followed by" Linked Group Details   100 mg 200 mL/hr over 30 Minutes Intravenous  Once 01/20/2021 2311 01/29/21 0028   01/27/2021 2315  remdesivir 100 mg in sodium chloride 0.9 % 100 mL IVPB       "Followed by" Linked Group Details   100 mg 200 mL/hr over 30 Minutes Intravenous  Once 01/23/2021 2311 01/14/2021 2353       Time Spent in minutes  30   Lala Lund M.D on 01/31/2021 at 11:02 AM  To page go to [email or web address]   [facility] -  Office  [phone]    See all Orders from today for further details    Objective:   Vitals:   01/31/21 0320 01/31/21 0436 01/31/21 0728 01/31/21 0909  BP: 139/66 (!) 103/58 101/65 117/69  Pulse: (!) 50 60 68 (!) 53  Resp: (!) 24 15 18  (!) 24  Temp:   98.5 F (36.9 C)   TempSrc:   Oral   SpO2: 99% 94% 90% 96%  Weight:      Height:        Wt Readings from Last 3 Encounters:  01/29/21 107.6 kg  01/17/2021 108.9 kg  01/09/21 114.8 kg     Intake/Output Summary (Last 24 hours) at 01/31/2021 1102 Last data filed at 01/31/2021 1038 Gross per 24 hour  Intake 240 ml  Output 1490 ml  Net -1250 ml     Physical Exam  Awake Alert, No new F.N deficits, Normal affect [place].AT,PERRAL  Supple Neck,No JVD, No cervical lymphadenopathy appriciated.  Symmetrical Chest Carollo movement, Good air movement bilaterally, CTAB RRR,No Gallops, Rubs or new Murmurs, No Parasternal Heave +ve B.Sounds, Abd Soft, No tenderness, No organomegaly appriciated, No rebound - guarding or rigidity. No Cyanosis, Clubbing or edema, No new Rash or bruise    Data Review:    CBC Recent Labs  Lab 02/07/2021 2209 01/29/21 0624 01/30/21 0141 01/31/21 0330  WBC 5.4 4.0 2.0* 3.8*  HGB 12.3* 12.0* 12.1* 11.7*  HCT 37.8* 37.7* 38.0* 36.6*  PLT 114* 127* 143* 179   MCV 92.0 92.0 93.1 92.2  MCH 29.9 29.3 29.7 29.5  MCHC 32.5 31.8 31.8 32.0  RDW 13.3 13.4 13.4 13.1  LYMPHSABS 0.5* 0.4* 0.6* 0.7  MONOABS 0.6 0.3 0.2 0.6  EOSABS 0.0 0.0 0.0 0.0  BASOSABS 0.0 0.0 0.0 0.0    Recent Labs  Lab 01/18/2021 2209 01/29/21 0311 01/29/21 0624 01/29/21 0741 01/30/21 0141 01/30/21 0616 01/31/21 0330 01/31/21 0331  NA 132*  --  136  --  134*  --  135  --   K 4.2  --  4.8  --  4.8  --  4.9  --   CL 99  --  102  --  102  --  102  --   CO2 23  --  24  --  20*  --  26  --   GLUCOSE 149*  --  141*  --  126*  --  141*  --   BUN 14  --  11  --  16  --  22  --   CREATININE 1.49*  --  1.43*  --  1.16  --  1.44*  --   CALCIUM 9.0  --  9.4  --  9.3  --  9.2  --   AST 58*  --  62*  --  60*  --  62*  --   ALT 33  --  37  --  42  --  53*  --   ALKPHOS 49  --  55  --  52  --  49  --   BILITOT 0.6  --  0.6  --  0.6  --  0.8  --   ALBUMIN 3.7  --  3.3*  --  3.4*  --  3.0*  --   MG  --   --   --  2.0 2.1  --  2.3  --   CRP 1.8*  --  2.0*  --   --  1.4* 0.9  --   DDIMER 0.87*  --  1.55*  --   --  1.26* 0.96*  --   PROCALCITON <0.10  --   --   --  <0.10  --  <0.10  --   LATICACIDVEN 2.3* 2.4*  --   --   --   --   --   --   HGBA1C  --   --  6.0*  --   --   --   --   --   BNP  --   --   --  43.4 38.4  --   --  23.2    ------------------------------------------------------------------------------------------------------------------ Recent Labs    01/25/2021 2209  TRIG 93    Lab Results  Component Value Date   HGBA1C 6.0 (H) 01/29/2021   ------------------------------------------------------------------------------------------------------------------ No results for input(s): TSH, T4TOTAL, T3FREE, THYROIDAB in the last 72 hours.  Invalid input(s): FREET3  Cardiac Enzymes No results for input(s):  CKMB, TROPONINI, MYOGLOBIN in the last 168 hours.  Invalid input(s):  CK ------------------------------------------------------------------------------------------------------------------    Component Value Date/Time   BNP 23.2 01/31/2021 0331    Micro Results Recent Results (from the past 240 hour(s))  Blood Culture (routine x 2)     Status: None (Preliminary result)   Collection Time: 12-Feb-2021 10:08 PM   Specimen: BLOOD  Result Value Ref Range Status   Specimen Description   Final    BLOOD Blood Culture results may not be optimal due to an excessive volume of blood received in culture bottles Performed at [facility], [address] [place], [place] [address]    Special Requests   Final    BOTTLES DRAWN AEROBIC AND ANAEROBIC LEFT ANTECUBITAL Performed at [facility], [address] [place], [place] [address]    Culture   Final    NO GROWTH 2 DAYS Performed at [facility], [place] [address], [place], [place] [address]    Report Status PENDING  Incomplete  Resp Panel by RT-PCR (Flu A&B, Covid) Nasopharyngeal Swab     Status: Abnormal   Collection Time: 02-12-2021 10:20 PM   Specimen: Nasopharyngeal Swab; Nasopharyngeal(NP) swabs in vial transport medium  Result Value Ref Range Status   SARS Coronavirus 2 by RT PCR POSITIVE (A) NEGATIVE Final    Comment: RESULT CALLED TO, READ BACK BY AND VERIFIED WITH: ALFRED DILLARD RN AT 3532 BY AV CALLED ON 02/12/2021 BY A VASSER (NOTE) SARS-CoV-2 target nucleic acids are DETECTED.  The SARS-CoV-2 RNA is generally detectable in upper respiratory specimens during the acute phase of infection. Positive results are indicative of the presence of the identified virus, but do not rule out bacterial infection or co-infection with other pathogens not detected by the test. Clinical correlation with patient history and other diagnostic information is necessary to determine patient infection status. The expected result is Negative.  Fact Sheet for  Patients: EntrepreneurPulse.com.au  Fact Sheet for Healthcare Providers: IncredibleEmployment.be  This test is not yet approved or cleared by the [place] FDA and  has been authorized for detection and/or diagnosis of SARS-CoV-2 by FDA under an Emergency Use Authorization (EUA).  This EUA will remain in ef fect (meaning this test can be used) for the duration of  the COVID-19 declaration under Section 564(b)(1) of the Act, 21 U.S.C. section 360bbb-3(b)(1), unless the authorization is terminated or revoked sooner.     Influenza A by PCR NEGATIVE NEGATIVE Final   Influenza B by PCR NEGATIVE NEGATIVE Final    Comment: (NOTE) The Xpert Xpress SARS-CoV-2/FLU/RSV plus assay is intended as an aid in the diagnosis of influenza from Nasopharyngeal swab specimens and should not be used as a sole basis for treatment. Nasal washings and aspirates are unacceptable for Xpert Xpress SARS-CoV-2/FLU/RSV testing.  Fact Sheet for Patients: EntrepreneurPulse.com.au  Fact Sheet for Healthcare Providers: IncredibleEmployment.be  This test is not yet approved or cleared by the [place] FDA and has been authorized for detection and/or diagnosis of SARS-CoV-2 by FDA under an Emergency Use Authorization (EUA). This EUA will remain in effect (meaning this test can be used) for the duration of the COVID-19 declaration under Section 564(b)(1) of the Act, 21 U.S.C. section 360bbb-3(b)(1), unless the authorization is terminated or revoked.  Performed at [facility], [address] [place], [place] [address]   Blood Culture (routine x 2)     Status: None (Preliminary result)   Collection Time: Feb 12, 2021  10:23 PM   Specimen: BLOOD  Result Value Ref Range Status   Specimen Description   Final    BLOOD Blood Culture adequate volume Performed at [facility], [address] [place], [place] [address]     Special Requests   Final    BOTTLES DRAWN AEROBIC AND ANAEROBIC RIGHT ANTECUBITAL Performed at [facility], [address] [place], [place] [address]    Culture   Final    NO GROWTH 2 DAYS Performed at [facility], [place] [address] [place], [place] [address]    Report Status PENDING  Incomplete  MRSA PCR Screening     Status: None   Collection Time: 01/30/21  6:08 PM   Specimen: Nasal Mucosa; Nasopharyngeal  Result Value Ref Range Status   MRSA by PCR NEGATIVE NEGATIVE Final    Comment:        The GeneXpert MRSA Assay (FDA approved for NASAL specimens only), is one component of a comprehensive MRSA colonization surveillance program. It is not intended to diagnose MRSA infection nor to guide or monitor treatment for MRSA infections. Performed at [facility], [place] [address] [place], [place] [address]     Radiology Reports DG Chest [place] 1 View  Result Date: 01/31/2021 CLINICAL DATA:  70 year old male with a history of shortness of breath and COVID EXAM: PORTABLE CHEST 1 VIEW COMPARISON:  01/29/2021, 01/18/2021 FINDINGS: Cardiomediastinal silhouette unchanged. Low lung volumes persist. Reticulonodular opacities of the lungs, with increasing airspace disease at the left lung base and development of air bronchograms. No pneumothorax. No large pleural effusion. IMPRESSION: Increasing interstitial and airspace disease of the bilateral lungs particularly at the left lung base. Given the air bronchograms, lobar pneumonia/aspiration pneumonia also considered in addition to viral pneumonia. Electronically Signed   By: Corrie Mckusick D.O.   On: 01/31/2021 08:29   DG Chest Port 1 View  Result Date: 01/29/2021 CLINICAL DATA:  Shortness of breath, COVID-19 EXAM: PORTABLE CHEST 1 VIEW COMPARISON:  Portable exam 0820 hours compared to 01/20/2021 FINDINGS: Normal heart size, mediastinal contours, and pulmonary vascularity. Patchy infiltrates in mid to lower LEFT lung and  minimally at RIGHT base consistent with multifocal pneumonia. RIGHT lung infiltrate is perhaps slightly improved since previous exam. No pleural effusion or pneumothorax. Bones unremarkable. IMPRESSION: BILATERAL pulmonary infiltrates consistent with multifocal pneumonia, perhaps minimally improved in RIGHT lung. Electronically Signed   By: Lavonia Dana M.D.   On: 01/29/2021 08:34   DG Chest Port 1 View  Result Date: 02/06/2021 CLINICAL DATA:  Cough.  Coronavirus infection. EXAM: PORTABLE CHEST 1 VIEW COMPARISON:  11/24/2017 FINDINGS: Heart size is normal. Chronic aortic atherosclerosis and tortuosity as seen previously. Hazy pulmonary infiltrates in the mid and lower lungs consistent with viral pneumonia. No dense consolidation. Chronic elevation of the left hemidiaphragm. No evidence of effusion. No acute bone finding. IMPRESSION: 1. Hazy pulmonary infiltrates in the mid and lower lungs consistent with viral pneumonia. No dense consolidation. 2. Aortic atherosclerosis. Electronically Signed   By: Nelson Chimes M.D.   On: 01/14/2021 22:21   VAS [place] LOWER EXTREMITY VENOUS (DVT)  Result Date: 01/31/2021  Lower Venous DVT Study Indications: Covid, d-dimer.  Anticoagulation: Lovenox. Comparison Study: No prior studies. Performing Technologist: Darlin Coco RDMS  Examination Guidelines: A complete evaluation includes B-mode imaging, spectral Doppler, color Doppler, and power Doppler as needed of all accessible portions of each vessel. Bilateral testing is considered an integral part of a complete examination.  Limited examinations for reoccurring indications may be performed as noted. The reflux portion of the exam is performed with the patient in reverse Trendelenburg.  +---------+---------------+---------+-----------+----------+-------------------+ RIGHT    CompressibilityPhasicitySpontaneityPropertiesThrombus Aging      +---------+---------------+---------+-----------+----------+-------------------+ CFV       Full           Yes      Yes                                      +---------+---------------+---------+-----------+----------+-------------------+ SFJ      Full                                                             +---------+---------------+---------+-----------+----------+-------------------+ FV Prox  Full                                                             +---------+---------------+---------+-----------+----------+-------------------+ FV Mid   Full                                                             +---------+---------------+---------+-----------+----------+-------------------+ FV DistalFull                                                             +---------+---------------+---------+-----------+----------+-------------------+ PFV      Full                                                             +---------+---------------+---------+-----------+----------+-------------------+ POP      Full           No       Yes                  Rouleaux flow noted +---------+---------------+---------+-----------+----------+-------------------+ PTV      Full                                                             +---------+---------------+---------+-----------+----------+-------------------+ PERO     Full                                                             +---------+---------------+---------+-----------+----------+-------------------+   +---------+---------------+---------+-----------+----------+-------------------+  LEFT     CompressibilityPhasicitySpontaneityPropertiesThrombus Aging      +---------+---------------+---------+-----------+----------+-------------------+ CFV      Full           Yes      Yes                                      +---------+---------------+---------+-----------+----------+-------------------+ SFJ      Full                                                              +---------+---------------+---------+-----------+----------+-------------------+ FV Prox  Full                                                             +---------+---------------+---------+-----------+----------+-------------------+ FV Mid   Full                                                             +---------+---------------+---------+-----------+----------+-------------------+ FV DistalFull                                                             +---------+---------------+---------+-----------+----------+-------------------+ PFV      Full                                                             +---------+---------------+---------+-----------+----------+-------------------+ POP      Full           No       Yes                  Rouleaux flow noted +---------+---------------+---------+-----------+----------+-------------------+ PTV      Full                                                             +---------+---------------+---------+-----------+----------+-------------------+ PERO     Full                                                             +---------+---------------+---------+-----------+----------+-------------------+     Summary: RIGHT: - There is no evidence of deep  vein thrombosis in the lower extremity.  - No cystic structure found in the popliteal fossa.  LEFT: - There is no evidence of deep vein thrombosis in the lower extremity.  - No cystic structure found in the popliteal fossa.  *See table(s) above for measurements and observations. Electronically signed by Jamelle Haring on 01/31/2021 at 10:53:05 AM.    Final    ECHOCARDIOGRAM LIMITED  Result Date: 01/30/2021    ECHOCARDIOGRAM LIMITED REPORT   Patient Name:   SHAWNDELL SCHILLACI Vines Date of Exam: 01/30/2021 Medical Rec #:  [id number]    Height:       72.0 in Accession #:    [id number]   Weight:       237.2 lb Date of Birth:  [birth year]    BSA:          2.291 m Patient Age:    16 years      BP:           103/65 mmHg Patient Gender: M            HR:           52 bpm. Exam Location:  Inpatient Procedure: Limited Echo, Cardiac Doppler and Color Doppler Indications:    CHF-Acute diastolic  History:        Patient has no prior history of Echocardiogram examinations.                 CAD; Risk Factors:Sleep Apnea. CKD. GERD.  Sonographer:    Clayton Lefort RDCS (AE) Referring Phys: [id number]intosh [place]  1. Left ventricular ejection fraction, by estimation, is 60 to 65%. The left ventricle has normal function. The left ventricle has no regional Yeakle motion abnormalities. There is mild left ventricular hypertrophy. Left ventricular diastolic parameters are consistent with Grade I diastolic dysfunction (impaired relaxation).  2. Right ventricular systolic function is normal. The right ventricular size is normal.  3. The mitral valve is normal in structure. No evidence of mitral valve regurgitation. No evidence of mitral stenosis.  4. The aortic valve is normal in structure. Aortic valve regurgitation is not visualized. No aortic stenosis is present.  5. The inferior vena cava is normal in size with greater than 50% respiratory variability, suggesting right atrial pressure of 3 mmHg. FINDINGS  Left Ventricle: Left ventricular ejection fraction, by estimation, is 60 to 65%. The left ventricle has normal function. The left ventricle has no regional Lipke motion abnormalities. The left ventricular internal cavity size was normal in size. There is  mild left ventricular hypertrophy. Left ventricular diastolic parameters are consistent with Grade I diastolic dysfunction (impaired relaxation). Right Ventricle: The right ventricular size is normal. No increase in right ventricular Quinn thickness. Right ventricular systolic function is normal. Left Atrium: Left atrial size was normal in size. Right Atrium: Right atrial size was normal in size. Pericardium: There is no evidence of pericardial effusion. Mitral  Valve: The mitral valve is normal in structure. No evidence of mitral valve stenosis. Tricuspid Valve: The tricuspid valve is normal in structure. Tricuspid valve regurgitation is not demonstrated. No evidence of tricuspid stenosis. Aortic Valve: The aortic valve is normal in structure. Aortic valve regurgitation is not visualized. No aortic stenosis is present. Aortic valve mean gradient measures 5.0 mmHg. Aortic valve peak gradient measures 7.1 mmHg. Aortic valve area, by VTI measures 2.23 cm. Pulmonic Valve: The pulmonic valve was normal in structure. Pulmonic valve regurgitation is not visualized. No evidence of pulmonic stenosis. Aorta: The aortic  root is normal in size and structure. Venous: The inferior vena cava is normal in size with greater than 50% respiratory variability, suggesting right atrial pressure of 3 mmHg. IAS/Shunts: No atrial level shunt detected by color flow Doppler. LEFT VENTRICLE PLAX 2D LVIDd:         4.30 cm  Diastology LVIDs:         2.80 cm  LV e' medial:    5.22 cm/s LV PW:         1.40 cm  LV E/e' medial:  10.7 LV IVS:        1.20 cm  LV e' lateral:   6.85 cm/s LVOT diam:     2.00 cm  LV E/e' lateral: 8.2 LV SV:         69 LV SV Index:   30 LVOT Area:     3.14 cm  IVC IVC diam: 1.80 cm LEFT ATRIUM         Index LA diam:    3.20 cm 1.40 cm/m  AORTIC VALVE AV Area (Vmax):    2.65 cm AV Area (Vmean):   2.20 cm AV Area (VTI):     2.23 cm AV Vmax:           133.00 cm/s AV Vmean:          103.000 cm/s AV VTI:            0.310 m AV Peak Grad:      7.1 mmHg AV Mean Grad:      5.0 mmHg LVOT Vmax:         112.00 cm/s LVOT Vmean:        72.000 cm/s LVOT VTI:          0.220 m LVOT/AV VTI ratio: 0.71  AORTA Ao Root diam: 3.70 cm Ao Asc diam:  3.30 cm MITRAL VALVE MV Area (PHT): 2.44 cm    SHUNTS MV Decel Time: 311 msec    Systemic VTI:  0.22 m MV E velocity: 56.10 cm/s  Systemic Diam: 2.00 cm MV A velocity: 50.60 cm/s MV E/A ratio:  1.11 Candee Furbish MD Electronically signed by Candee Furbish  MD Signature Date/Time: 01/30/2021/3:26:43 PM    Final

## 2021-01-31 NOTE — Plan of Care (Signed)

## 2021-01-31 NOTE — Progress Notes (Signed)
Due to pt being on [place] at this time he is unable to wear cpap at night because of the amount of O2 he is requiring. As soon as pt is able to wean to a [place] HFNC at 8L or less we will place pt on cpap as MD ordered.

## 2021-02-01 ENCOUNTER — Inpatient Hospital Stay (HOSPITAL_COMMUNITY): Payer: Medicare Other

## 2021-02-01 DIAGNOSIS — J9601 Acute respiratory failure with hypoxia: Secondary | ICD-10-CM | POA: Diagnosis not present

## 2021-02-01 DIAGNOSIS — U071 COVID-19: Secondary | ICD-10-CM | POA: Diagnosis not present

## 2021-02-01 LAB — COMPREHENSIVE METABOLIC PANEL
ALT: 54 U/L — ABNORMAL HIGH (ref 0–44)
AST: 53 U/L — ABNORMAL HIGH (ref 15–41)
Albumin: 3.1 g/dL — ABNORMAL LOW (ref 3.5–5.0)
Alkaline Phosphatase: 49 U/L (ref 38–126)
Anion gap: 10 (ref 5–15)
BUN: 31 mg/dL — ABNORMAL HIGH (ref 8–23)
CO2: 25 mmol/L (ref 22–32)
Calcium: 9.1 mg/dL (ref 8.9–10.3)
Chloride: 100 mmol/L (ref 98–111)
Creatinine, Ser: 1.6 mg/dL — ABNORMAL HIGH (ref 0.61–1.24)
GFR, Estimated: 46 mL/min — ABNORMAL LOW (ref >60)
Glucose, Bld: 135 mg/dL — ABNORMAL HIGH (ref 70–99)
Potassium: 4.5 mmol/L (ref 3.5–5.1)
Sodium: 135 mmol/L (ref 135–145)
Total Bilirubin: 1.1 mg/dL (ref 0.3–1.2)
Total Protein: 6.1 g/dL — ABNORMAL LOW (ref 6.5–8.1)

## 2021-02-01 LAB — CBC WITH DIFFERENTIAL/PLATELET
Abs Immature Granulocytes: 0.06 10*3/uL (ref 0.00–0.07)
Basophils Absolute: 0 10*3/uL (ref 0.0–0.1)
Basophils Relative: 0 %
Eosinophils Absolute: 0 10*3/uL (ref 0.0–0.5)
Eosinophils Relative: 0 %
HCT: 37.6 % — ABNORMAL LOW (ref 39.0–52.0)
Hemoglobin: 12.2 g/dL — ABNORMAL LOW (ref 13.0–17.0)
Immature Granulocytes: 1 %
Lymphocytes Relative: 11 %
Lymphs Abs: 0.7 10*3/uL (ref 0.7–4.0)
MCH: 29.8 pg (ref 26.0–34.0)
MCHC: 32.4 g/dL (ref 30.0–36.0)
MCV: 91.9 fL (ref 80.0–100.0)
Monocytes Absolute: 0.6 10*3/uL (ref 0.1–1.0)
Monocytes Relative: 10 %
Neutro Abs: 4.6 10*3/uL (ref 1.7–7.7)
Neutrophils Relative %: 78 %
Platelets: 195 10*3/uL (ref 150–400)
RBC: 4.09 MIL/uL — ABNORMAL LOW (ref 4.22–5.81)
RDW: 13.1 % (ref 11.5–15.5)
WBC: 5.9 10*3/uL (ref 4.0–10.5)
nRBC: 0 % (ref 0.0–0.2)

## 2021-02-01 LAB — GLUCOSE, CAPILLARY
Glucose-Capillary: 121 mg/dL — ABNORMAL HIGH (ref 70–99)
Glucose-Capillary: 122 mg/dL — ABNORMAL HIGH (ref 70–99)
Glucose-Capillary: 156 mg/dL — ABNORMAL HIGH (ref 70–99)
Glucose-Capillary: 159 mg/dL — ABNORMAL HIGH (ref 70–99)

## 2021-02-01 LAB — MAGNESIUM: Magnesium: 2.4 mg/dL (ref 1.7–2.4)

## 2021-02-01 LAB — PROCALCITONIN: Procalcitonin: <0.1 ng/mL

## 2021-02-01 LAB — D-DIMER, QUANTITATIVE: D-Dimer, Quant: 0.98 ug/mL-FEU — ABNORMAL HIGH (ref 0.00–0.50)

## 2021-02-01 LAB — BRAIN NATRIURETIC PEPTIDE: B Natriuretic Peptide: 21.2 pg/mL (ref 0.0–100.0)

## 2021-02-01 LAB — C-REACTIVE PROTEIN: CRP: 0.7 mg/dL (ref <1.0)

## 2021-02-01 MED ORDER — METHYLPREDNISOLONE SODIUM SUCC 40 MG IJ SOLR
40.0000 mg | Freq: Two times a day (BID) | INTRAMUSCULAR | Status: DC
  Administered 2021-02-01 – 2021-02-14 (×26): 40 mg via INTRAVENOUS
  Filled 2021-02-01 (×26): qty 1

## 2021-02-01 MED ORDER — LACTATED RINGERS IV SOLN: INTRAVENOUS | Status: AC

## 2021-02-01 NOTE — Progress Notes (Signed)
Pt still requiring too mucg oxygen to wear cpap at this time.  RT will continue to monitor.

## 2021-02-01 NOTE — Progress Notes (Signed)
PROGRESS NOTE                                                                                                                                                                                                             Patient Demographics:    Kurt Williams, is a 70 y.o. male, DOB - 1951-01-08, ENM:[id number]  Outpatient Primary MD for the patient is Shelda Pal, DO    LOS - 3  Admit date - 01/16/2021    Chief Complaint  Patient presents with  . Covid Positive       Brief Narrative (HPI from H&P) - Kurt Williams is a 70 y.o. male with medical history significant of CAD, OSA on CPAP.  Pt presents to the ED with c/o SOB and cough ongoing for 5-6 days, he is unvaccinated, he was diagnosed with severe hypoxic Resp failure due to Covid PNA.   Subjective:   Patient in bed, appears comfortable, denies any headache, no fever, no chest pain or pressure, improved shortness of breath , no abdominal pain. No focal weakness.   Assessment  & Plan :     1. Acute Hypoxic Resp. Failure due to Acute Covid 19 Viral Pneumonitis during the ongoing 2020 Covid 19 Pandemic - he is unvaccinated and unfortunately has incurred severe parenchymal lung injury due to COVID-19 pneumonia/ARDS.  He is being treated with high-dose IV steroids, Remdesivir along with Actemra.  Also on moderate dose Lovenox overall remains extremely tenuous, will continue to monitor closely.  Encouraged the patient to sit up in chair in the daytime use I-S and flutter valve for pulmonary toiletry and then prone in bed when at night.  Will advance activity and titrate down oxygen as possible.   SpO2: 96 % O2 Flow Rate (L/min): 25 L/min FiO2 (%): 60 %  Recent Labs  Lab 01/26/2021 2209 01/16/2021 2220 01/29/21 0311 01/29/21 0624 01/29/21 0741 01/30/21 0141 01/30/21 0616 01/31/21 0330 01/31/21 0331 02/01/21 0156  WBC 5.4  --   --  4.0  --  2.0*  --  3.8*  --   5.9  HGB 12.3*  --   --  12.0*  --  12.1*  --  11.7*  --  12.2*  HCT 37.8*  --   --  37.7*  --  38.0*  --  36.6*  --  37.6*  PLT 114*  --   --  127*  --  143*  --  179  --  195  CRP 1.8*  --   --  2.0*  --   --  1.4* 0.9  --  0.7  BNP  --   --   --   --  43.4 38.4  --   --  23.2 21.2  DDIMER 0.87*  --   --  1.55*  --   --  1.26* 0.96*  --  0.98*  PROCALCITON <0.10  --   --   --   --  <0.10  --  <0.10  --  <0.10  AST 58*  --   --  62*  --  60*  --  62*  --  53*  ALT 33  --   --  37  --  42  --  53*  --  54*  ALKPHOS 49  --   --  55  --  52  --  49  --  49  BILITOT 0.6  --   --  0.6  --  0.6  --  0.8  --  1.1  ALBUMIN 3.7  --   --  3.3*  --  3.4*  --  3.0*  --  3.1*  LATICACIDVEN 2.3*  --  2.4*  --   --   --   --   --   --   --   SARSCOV2NAA  --  POSITIVE*  --   --   --   --   --   --   --   --     2.  High D-dimer due to intense inflammation.  On moderate dose Lovenox, negative leg ultrasound.  3.  CAD.  Chest pain-free currently on aspirin and statin for secondary prevention.  Stable echocardiogram EF 60%, no WMA. EKG Nonacute.  4. AKI on ? CKD 3 A.  Baseline creatinine around 1.4.  Will give gentle IVF on 02/01/21 and recheck.  5.  OSA.  CPAP nightly - ordered, if he can tolerate with max Fio2 .      Condition - Extremely Guarded  Family Communication  : Daughter and wife [phone] on 01/29/21 in detail, updated on tenuous condition and poor prognosis. Daughter updated again 01/30/2021, 01/31/21, 02/01/21.  Code Status :  Full  Consults  :  None  PUD Prophylaxis : PPI   Procedures  :     Echocardiogram - 1. Left ventricular ejection fraction, by estimation, is 60 to 65%. The left ventricle has normal function. The left ventricle has no regional Kurt Williams. There is mild left ventricular hypertrophy. Left ventricular diastolic parameters are consistent with Grade I diastolic dysfunction (impaired relaxation).  2. Right ventricular systolic function is normal. The  right ventricular size is normal.  3. The mitral valve is normal in structure. No evidence of mitral valve regurgitation. No evidence of mitral stenosis.  4. The aortic valve is normal in structure. Aortic valve regurgitation is not visualized. No aortic stenosis is present.  5. The inferior vena cava is normal in size with greater than 50% respiratory variability, suggesting right atrial pressure of 3 mmHg.  Leg ultrasound - No DVT      Disposition Plan  :    Status is: Inpatient  Remains inpatient appropriate because:IV treatments appropriate due to intensity of illness or inability to take PO  Dispo: The patient is from: Home  Anticipated d/c is to: Home              Anticipated d/c date is: > 3 days              Patient currently is not medically stable to d/c.   Difficult to place patient No  DVT Prophylaxis  :  Lovenox    Lab Results  Component Value Date   PLT 195 02/01/2021    Diet :   Diet Order            Diet Heart Room service appropriate? Yes; Fluid consistency: Thin  Diet effective now                  Inpatient Medications  Scheduled Meds:  . aspirin  81 mg Oral Daily  . enoxaparin (LOVENOX) injection  55 mg Subcutaneous Q24H  . insulin aspart  0-9 Units Subcutaneous TID AC & HS  . melatonin  5 mg Oral QHS  . methylPREDNISolone (SOLU-MEDROL) injection  40 mg Intravenous Q12H  . pantoprazole  40 mg Oral Daily  . rosuvastatin  5 mg Oral Daily  . tamsulosin  0.4 mg Oral Daily   Continuous Infusions: . lactated ringers     PRN Meds:.acetaminophen, nitroGLYCERIN, [DISCONTINUED] ondansetron **OR** ondansetron (ZOFRAN) IV  Antibiotics  :    Anti-infectives (From admission, onward)   Start     Dose/Rate Route Frequency Ordered Stop   01/30/21 1800  remdesivir 100 mg in sodium chloride 0.9 % 100 mL IVPB  Status:  Discontinued       "Followed by" Linked Group Details   100 mg 200 mL/hr over 30 Minutes Intravenous Daily 01/29/2021 2311  01/29/21 1041   01/29/21 1600  remdesivir 100 mg in sodium chloride 0.9 % 100 mL IVPB       "Followed by" Linked Group Details   100 mg 200 mL/hr over 30 Minutes Intravenous Daily 01/29/21 1041 02/01/21 1001   01/26/2021 2339  remdesivir 100 mg in sodium chloride 0.9 % 100 mL IVPB       "Followed by" Linked Group Details   100 mg 200 mL/hr over 30 Minutes Intravenous  Once 02/02/2021 2311 01/29/21 0028   02/02/2021 2315  remdesivir 100 mg in sodium chloride 0.9 % 100 mL IVPB       "Followed by" Linked Group Details   100 mg 200 mL/hr over 30 Minutes Intravenous  Once 01/13/2021 2311 01/27/2021 2353       Time Spent in minutes  30   Lala Lund M.D on 02/01/2021 at 10:44 AM  To page go to [email or web address]   [facility] -  Office  [phone]    See all Orders from today for further details    Objective:   Vitals:   01/31/21 2243 01/31/21 2348 02/01/21 0400 02/01/21 0720  BP:  118/75 114/72 102/68  Pulse: 61 61 82 (!) 56  Resp: 20 14 20  (!) 22  Temp:  98 F (36.7 C) 98.1 F (36.7 C) 98 F (36.7 C)  TempSrc:  Oral Oral Axillary  SpO2: 96% (!) 86% 97% 96%  Weight:      Height:        Wt Readings from Last 3 Encounters:  01/29/21 107.6 kg  01/19/2021 108.9 kg  01/09/21 114.8 kg     Intake/Output Summary (Last 24 hours) at 02/01/2021 1044 Last data filed at 02/01/2021 0840 Gross per 24 hour  Intake 480 ml  Output 1060 ml  Net -580 ml  Physical Exam  Awake Alert, No new F.N deficits, Normal affect Kurt Williams,Kurt Williams Supple Neck,No JVD, No cervical lymphadenopathy appriciated.  Symmetrical Chest Birkey movement, Good air movement bilaterally, CTAB RRR,No Gallops, Rubs or new Murmurs, No Parasternal Heave +ve B.Sounds, Abd Soft, No tenderness, No organomegaly appriciated, No rebound - guarding or rigidity. No Cyanosis, Clubbing or edema, No new Rash or bruise    Data Review:    CBC Recent Labs  Lab 01/27/2021 2209 01/29/21 0624 01/30/21 0141 01/31/21 0330  02/01/21 0156  WBC 5.4 4.0 2.0* 3.8* 5.9  HGB 12.3* 12.0* 12.1* 11.7* 12.2*  HCT 37.8* 37.7* 38.0* 36.6* 37.6*  PLT 114* 127* 143* 179 195  MCV 92.0 92.0 93.1 92.2 91.9  MCH 29.9 29.3 29.7 29.5 29.8  MCHC 32.5 31.8 31.8 32.0 32.4  RDW 13.3 13.4 13.4 13.1 13.1  LYMPHSABS 0.5* 0.4* 0.6* 0.7 0.7  MONOABS 0.6 0.3 0.2 0.6 0.6  EOSABS 0.0 0.0 0.0 0.0 0.0  BASOSABS 0.0 0.0 0.0 0.0 0.0    Recent Labs  Lab 02/03/2021 2209 01/29/21 0311 01/29/21 0624 01/29/21 0741 01/30/21 0141 01/30/21 0616 01/31/21 0330 01/31/21 0331 02/01/21 0156  NA 132*  --  136  --  134*  --  135  --  135  K 4.2  --  4.8  --  4.8  --  4.9  --  4.5  CL 99  --  102  --  102  --  102  --  100  CO2 23  --  24  --  20*  --  26  --  25  GLUCOSE 149*  --  141*  --  126*  --  141*  --  135*  BUN 14  --  11  --  16  --  22  --  31*  CREATININE 1.49*  --  1.43*  --  1.16  --  1.44*  --  1.60*  CALCIUM 9.0  --  9.4  --  9.3  --  9.2  --  9.1  AST 58*  --  62*  --  60*  --  62*  --  53*  ALT 33  --  37  --  42  --  53*  --  54*  ALKPHOS 49  --  55  --  52  --  49  --  49  BILITOT 0.6  --  0.6  --  0.6  --  0.8  --  1.1  ALBUMIN 3.7  --  3.3*  --  3.4*  --  3.0*  --  3.1*  MG  --   --   --  2.0 2.1  --  2.3  --  2.4  CRP 1.8*  --  2.0*  --   --  1.4* 0.9  --  0.7  DDIMER 0.87*  --  1.55*  --   --  1.26* 0.96*  --  0.98*  PROCALCITON <0.10  --   --   --  <0.10  --  <0.10  --  <0.10  LATICACIDVEN 2.3* 2.4*  --   --   --   --   --   --   --   HGBA1C  --   --  6.0*  --   --   --   --   --   --   BNP  --   --   --  43.4 38.4  --   --  23.2 21.2    ------------------------------------------------------------------------------------------------------------------  No results for input(s): CHOL, HDL, LDLCALC, TRIG, CHOLHDL, LDLDIRECT in the last 72 hours.  Lab Results  Component Value Date   HGBA1C 6.0 (H) 01/29/2021    ------------------------------------------------------------------------------------------------------------------ No results for input(s): TSH, T4TOTAL, T3FREE, THYROIDAB in the last 72 hours.  Invalid input(s): FREET3  Cardiac Enzymes No results for input(s): CKMB, TROPONINI, MYOGLOBIN in the last 168 hours.  Invalid input(s): CK ------------------------------------------------------------------------------------------------------------------    Component Value Date/Time   BNP 21.2 02/01/2021 0156    Micro Results Recent Results (from the past 240 hour(s))  Blood Culture (routine x 2)     Status: None (Preliminary result)   Collection Time: 02/08/2021 10:08 PM   Specimen: BLOOD  Result Value Ref Range Status   Specimen Description   Final    BLOOD Blood Culture results may not be optimal due to an excessive volume of blood received in culture bottles Performed at [facility], [address] [place], [place] [address]    Special Requests   Final    BOTTLES DRAWN AEROBIC AND ANAEROBIC LEFT ANTECUBITAL Performed at [facility], [address] [place], [place] [address]    Culture   Final    NO GROWTH 2 DAYS Performed at [facility], [place] [address] [place], [place] [address]    Report Status PENDING  Incomplete  Resp Panel by RT-PCR (Flu A&B, Covid) Nasopharyngeal Swab     Status: Abnormal   Collection Time: 01/15/2021 10:20 PM   Specimen: Nasopharyngeal Swab; Nasopharyngeal(NP) swabs in vial transport medium  Result Value Ref Range Status   SARS Coronavirus 2 by RT PCR POSITIVE (A) NEGATIVE Final    Comment: RESULT CALLED TO, READ BACK BY AND VERIFIED WITH: ALFRED DILLARD RN AT 1324 BY AV CALLED ON 02/08/2021 BY A VASSER (NOTE) SARS-CoV-2 target nucleic acids are DETECTED.  The SARS-CoV-2 RNA is generally detectable in upper respiratory specimens during the acute phase of infection. Positive results are indicative of the presence of the  identified virus, but do not rule out bacterial infection or co-infection with other pathogens not detected by the test. Clinical correlation with patient history and other diagnostic information is necessary to determine patient infection status. The expected result is Negative.  Fact Sheet for Patients: EntrepreneurPulse.com.au  Fact Sheet for Healthcare Providers: IncredibleEmployment.be  This test is not yet approved or cleared by the [place] FDA and  has been authorized for detection and/or diagnosis of SARS-CoV-2 by FDA under an Emergency Use Authorization (EUA).  This EUA will remain in ef fect (meaning this test can be used) for the duration of  the COVID-19 declaration under Section 564(b)(1) of the Act, 21 U.S.C. section 360bbb-3(b)(1), unless the authorization is terminated or revoked sooner.     Influenza A by PCR NEGATIVE NEGATIVE Final   Influenza B by PCR NEGATIVE NEGATIVE Final    Comment: (NOTE) The Xpert Xpress SARS-CoV-2/FLU/RSV plus assay is intended as an aid in the diagnosis of influenza from Nasopharyngeal swab specimens and should not be used as a sole basis for treatment. Nasal washings and aspirates are unacceptable for Xpert Xpress SARS-CoV-2/FLU/RSV testing.  Fact Sheet for Patients: EntrepreneurPulse.com.au  Fact Sheet for Healthcare Providers: IncredibleEmployment.be  This test is not yet approved or cleared by the [place] FDA and has been authorized for detection and/or diagnosis of SARS-CoV-2 by FDA under an Emergency Use Authorization (EUA). This EUA will remain in effect (meaning this test can be used) for the duration of  the COVID-19 declaration under Section 564(b)(1) of the Act, 21 U.S.C. section 360bbb-3(b)(1), unless the authorization is terminated or revoked.  Performed at [facility], [address] [place], [place] [address]   Blood  Culture (routine x 2)     Status: None (Preliminary result)   Collection Time: 01/29/2021 10:23 PM   Specimen: BLOOD  Result Value Ref Range Status   Specimen Description   Final    BLOOD Blood Culture adequate volume Performed at [facility], [address] [place], [place] [address]    Special Requests   Final    BOTTLES DRAWN AEROBIC AND ANAEROBIC RIGHT ANTECUBITAL Performed at [facility], [address] [place], [place] [address]    Culture   Final    NO GROWTH 2 DAYS Performed at [facility], [place] [address], [place], [place] [address]    Report Status PENDING  Incomplete  MRSA PCR Screening     Status: None   Collection Time: 01/30/21  6:08 PM   Specimen: Nasal Mucosa; Nasopharyngeal  Result Value Ref Range Status   MRSA by PCR NEGATIVE NEGATIVE Final    Comment:        The GeneXpert MRSA Assay (FDA approved for NASAL specimens only), is one component of a comprehensive MRSA colonization surveillance program. It is not intended to diagnose MRSA infection nor to guide or monitor treatment for MRSA infections. Performed at [facility], [place] [address] [place],  [address]     Radiology Reports DG Chest [place] 1 View  Result Date: 02/01/2021 CLINICAL DATA:  Shortness of breath.  COVID positive. EXAM: PORTABLE CHEST 1 VIEW COMPARISON:  01/31/2021 FINDINGS: Stable cardiomediastinal contours. Persistent low lung volumes. Mild diffuse increase interstitial markings noted bilaterally. More focal airspace disease is noted within the left lung base. The visualized osseous structures appear intact. IMPRESSION: 1. Stable low lung volumes and increased interstitial markings bilaterally. 2. More focal airspace disease within the left lung base, unchanged from previous exam. Electronically Signed   By: Kerby Moors M.D.   On: 02/01/2021 09:20   DG Chest Port 1 View  Result Date: 01/31/2021 CLINICAL DATA:  70 year old male with a history of  shortness of breath and COVID EXAM: PORTABLE CHEST 1 VIEW COMPARISON:  01/29/2021, 01/21/2021 FINDINGS: Cardiomediastinal silhouette unchanged. Low lung volumes persist. Reticulonodular opacities of the lungs, with increasing airspace disease at the left lung base and development of air bronchograms. No pneumothorax. No large pleural effusion. IMPRESSION: Increasing interstitial and airspace disease of the bilateral lungs particularly at the left lung base. Given the air bronchograms, lobar pneumonia/aspiration pneumonia also considered in addition to viral pneumonia. Electronically Signed   By: Corrie Mckusick D.O.   On: 01/31/2021 08:29   DG Chest Port 1 View  Result Date: 01/29/2021 CLINICAL DATA:  Shortness of breath, COVID-19 EXAM: PORTABLE CHEST 1 VIEW COMPARISON:  Portable exam 0820 hours compared to 02/06/2021 FINDINGS: Normal heart size, mediastinal contours, and pulmonary vascularity. Patchy infiltrates in mid to lower LEFT lung and minimally at RIGHT base consistent with multifocal pneumonia. RIGHT lung infiltrate is perhaps slightly improved since previous exam. No pleural effusion or pneumothorax. Bones unremarkable. IMPRESSION: BILATERAL pulmonary infiltrates consistent with multifocal pneumonia, perhaps minimally improved in RIGHT lung. Electronically Signed   By: Lavonia Dana M.D.   On: 01/29/2021 08:34   DG Chest Port 1 View  Result Date: 02/08/2021 CLINICAL DATA:  Cough.  Coronavirus infection. EXAM:  PORTABLE CHEST 1 VIEW COMPARISON:  11/24/2017 FINDINGS: Heart size is normal. Chronic aortic atherosclerosis and tortuosity as seen previously. Hazy pulmonary infiltrates in the mid and lower lungs consistent with viral pneumonia. No dense consolidation. Chronic elevation of the left hemidiaphragm. No evidence of effusion. No acute bone finding. IMPRESSION: 1. Hazy pulmonary infiltrates in the mid and lower lungs consistent with viral pneumonia. No dense consolidation. 2. Aortic atherosclerosis.  Electronically Signed   By: Nelson Chimes M.D.   On: 02/01/2021 22:21   VAS [place] LOWER EXTREMITY VENOUS (DVT)  Result Date: 01/31/2021  Lower Venous DVT Study Indications: Covid, d-dimer.  Anticoagulation: Lovenox. Comparison Study: No prior studies. Performing Technologist: Darlin Coco RDMS  Examination Guidelines: A complete evaluation includes B-mode imaging, spectral Doppler, color Doppler, and power Doppler as needed of all accessible portions of each vessel. Bilateral testing is considered an integral part of a complete examination. Limited examinations for reoccurring indications may be performed as noted. The reflux portion of the exam is performed with the patient in reverse Trendelenburg.  +---------+---------------+---------+-----------+----------+-------------------+ RIGHT    CompressibilityPhasicitySpontaneityPropertiesThrombus Aging      +---------+---------------+---------+-----------+----------+-------------------+ CFV      Full           Yes      Yes                                      +---------+---------------+---------+-----------+----------+-------------------+ SFJ      Full                                                             +---------+---------------+---------+-----------+----------+-------------------+ FV Prox  Full                                                             +---------+---------------+---------+-----------+----------+-------------------+ FV Mid   Full                                                             +---------+---------------+---------+-----------+----------+-------------------+ FV DistalFull                                                             +---------+---------------+---------+-----------+----------+-------------------+ PFV      Full                                                             +---------+---------------+---------+-----------+----------+-------------------+ POP      Full  No       Yes                  Rouleaux flow noted +---------+---------------+---------+-----------+----------+-------------------+ PTV      Full                                                             +---------+---------------+---------+-----------+----------+-------------------+ PERO     Full                                                             +---------+---------------+---------+-----------+----------+-------------------+   +---------+---------------+---------+-----------+----------+-------------------+ LEFT     CompressibilityPhasicitySpontaneityPropertiesThrombus Aging      +---------+---------------+---------+-----------+----------+-------------------+ CFV      Full           Yes      Yes                                      +---------+---------------+---------+-----------+----------+-------------------+ SFJ      Full                                                             +---------+---------------+---------+-----------+----------+-------------------+ FV Prox  Full                                                             +---------+---------------+---------+-----------+----------+-------------------+ FV Mid   Full                                                             +---------+---------------+---------+-----------+----------+-------------------+ FV DistalFull                                                             +---------+---------------+---------+-----------+----------+-------------------+ PFV      Full                                                             +---------+---------------+---------+-----------+----------+-------------------+ POP      Full           No       Yes  Rouleaux flow noted +---------+---------------+---------+-----------+----------+-------------------+ PTV      Full                                                              +---------+---------------+---------+-----------+----------+-------------------+ PERO     Full                                                             +---------+---------------+---------+-----------+----------+-------------------+     Summary: RIGHT: - There is no evidence of deep vein thrombosis in the lower extremity.  - No cystic structure found in the popliteal fossa.  LEFT: - There is no evidence of deep vein thrombosis in the lower extremity.  - No cystic structure found in the popliteal fossa.  *See table(s) above for measurements and observations. Electronically signed by Jamelle Haring on 01/31/2021 at 10:53:05 AM.    Final    ECHOCARDIOGRAM LIMITED  Result Date: 01/30/2021    ECHOCARDIOGRAM LIMITED REPORT   Patient Name:   NENO HOHENSEE Chillemi Date of Exam: 01/30/2021 Medical Rec #:  [id number]    Height:       72.0 in Accession #:    [id number]   Weight:       237.2 lb Date of Birth:  03/19/1951    BSA:          2.291 m Patient Age:    23 years     BP:           103/65 mmHg Patient Gender: M            HR:           52 bpm. Exam Location:  Inpatient Procedure: Limited Echo, Cardiac Doppler and Color Doppler Indications:    CHF-Acute diastolic  History:        Patient has no prior history of Echocardiogram examinations.                 CAD; Risk Factors:Sleep Apnea. CKD. GERD.  Sonographer:    Clayton Lefort RDCS (AE) Referring Phys: [id number] Margaree Mackintosh Victor  1. Left ventricular ejection fraction, by estimation, is 60 to 65%. The left ventricle has normal function. The left ventricle has no regional Tasso motion Williams. There is mild left ventricular hypertrophy. Left ventricular diastolic parameters are consistent with Grade I diastolic dysfunction (impaired relaxation).  2. Right ventricular systolic function is normal. The right ventricular size is normal.  3. The mitral valve is normal in structure. No evidence of mitral valve regurgitation. No evidence of mitral stenosis.  4. The aortic  valve is normal in structure. Aortic valve regurgitation is not visualized. No aortic stenosis is present.  5. The inferior vena cava is normal in size with greater than 50% respiratory variability, suggesting right atrial pressure of 3 mmHg. FINDINGS  Left Ventricle: Left ventricular ejection fraction, by estimation, is 60 to 65%. The left ventricle has normal function. The left ventricle has no regional Gaber motion Williams. The left ventricular internal cavity size was normal in size. There is  mild left ventricular hypertrophy. Left ventricular diastolic parameters are consistent with Grade  I diastolic dysfunction (impaired relaxation). Right Ventricle: The right ventricular size is normal. No increase in right ventricular Edling thickness. Right ventricular systolic function is normal. Left Atrium: Left atrial size was normal in size. Right Atrium: Right atrial size was normal in size. Pericardium: There is no evidence of pericardial effusion. Mitral Valve: The mitral valve is normal in structure. No evidence of mitral valve stenosis. Tricuspid Valve: The tricuspid valve is normal in structure. Tricuspid valve regurgitation is not demonstrated. No evidence of tricuspid stenosis. Aortic Valve: The aortic valve is normal in structure. Aortic valve regurgitation is not visualized. No aortic stenosis is present. Aortic valve mean gradient measures 5.0 mmHg. Aortic valve peak gradient measures 7.1 mmHg. Aortic valve area, by VTI measures 2.23 cm. Pulmonic Valve: The pulmonic valve was normal in structure. Pulmonic valve regurgitation is not visualized. No evidence of pulmonic stenosis. Aorta: The aortic root is normal in size and structure. Venous: The inferior vena cava is normal in size with greater than 50% respiratory variability, suggesting right atrial pressure of 3 mmHg. IAS/Shunts: No atrial level shunt detected by color flow Doppler. LEFT VENTRICLE PLAX 2D LVIDd:         4.30 cm  Diastology LVIDs:          2.80 cm  LV e' medial:    5.22 cm/s LV PW:         1.40 cm  LV E/e' medial:  10.7 LV IVS:        1.20 cm  LV e' lateral:   6.85 cm/s LVOT diam:     2.00 cm  LV E/e' lateral: 8.2 LV SV:         69 LV SV Index:   30 LVOT Area:     3.14 cm  IVC IVC diam: 1.80 cm LEFT ATRIUM         Index LA diam:    3.20 cm 1.40 cm/m  AORTIC VALVE AV Area (Vmax):    2.65 cm AV Area (Vmean):   2.20 cm AV Area (VTI):     2.23 cm AV Vmax:           133.00 cm/s AV Vmean:          103.000 cm/s AV VTI:            0.310 m AV Peak Grad:      7.1 mmHg AV Mean Grad:      5.0 mmHg LVOT Vmax:         112.00 cm/s LVOT Vmean:        72.000 cm/s LVOT VTI:          0.220 m LVOT/AV VTI ratio: 0.71  AORTA Ao Root diam: 3.70 cm Ao Asc diam:  3.30 cm MITRAL VALVE MV Area (PHT): 2.44 cm    SHUNTS MV Decel Time: 311 msec    Systemic VTI:  0.22 m MV E velocity: 56.10 cm/s  Systemic Diam: 2.00 cm MV A velocity: 50.60 cm/s MV E/A ratio:  1.11 Candee Furbish MD Electronically signed by Candee Furbish MD Signature Date/Time: 01/30/2021/3:26:43 PM    Final

## 2021-02-02 LAB — COMPREHENSIVE METABOLIC PANEL
ALT: 49 U/L — ABNORMAL HIGH (ref 0–44)
AST: 43 U/L — ABNORMAL HIGH (ref 15–41)
Albumin: 3.1 g/dL — ABNORMAL LOW (ref 3.5–5.0)
Alkaline Phosphatase: 51 U/L (ref 38–126)
Anion gap: 9 (ref 5–15)
BUN: 31 mg/dL — ABNORMAL HIGH (ref 8–23)
CO2: 23 mmol/L (ref 22–32)
Calcium: 9.1 mg/dL (ref 8.9–10.3)
Chloride: 102 mmol/L (ref 98–111)
Creatinine, Ser: 1.5 mg/dL — ABNORMAL HIGH (ref 0.61–1.24)
GFR, Estimated: 49 mL/min — ABNORMAL LOW (ref >60)
Glucose, Bld: 134 mg/dL — ABNORMAL HIGH (ref 70–99)
Potassium: 4.4 mmol/L (ref 3.5–5.1)
Sodium: 134 mmol/L — ABNORMAL LOW (ref 135–145)
Total Bilirubin: 0.7 mg/dL (ref 0.3–1.2)
Total Protein: 6.2 g/dL — ABNORMAL LOW (ref 6.5–8.1)

## 2021-02-02 LAB — PROCALCITONIN: Procalcitonin: <0.1 ng/mL

## 2021-02-02 LAB — CBC WITH DIFFERENTIAL/PLATELET
Abs Immature Granulocytes: 0.13 10*3/uL — ABNORMAL HIGH (ref 0.00–0.07)
Basophils Absolute: 0 10*3/uL (ref 0.0–0.1)
Basophils Relative: 0 %
Eosinophils Absolute: 0 10*3/uL (ref 0.0–0.5)
Eosinophils Relative: 0 %
HCT: 36.7 % — ABNORMAL LOW (ref 39.0–52.0)
Hemoglobin: 12 g/dL — ABNORMAL LOW (ref 13.0–17.0)
Immature Granulocytes: 2 %
Lymphocytes Relative: 7 %
Lymphs Abs: 0.7 10*3/uL (ref 0.7–4.0)
MCH: 30 pg (ref 26.0–34.0)
MCHC: 32.7 g/dL (ref 30.0–36.0)
MCV: 91.8 fL (ref 80.0–100.0)
Monocytes Absolute: 0.6 10*3/uL (ref 0.1–1.0)
Monocytes Relative: 7 %
Neutro Abs: 7.6 10*3/uL (ref 1.7–7.7)
Neutrophils Relative %: 84 %
Platelets: 238 10*3/uL (ref 150–400)
RBC: 4 MIL/uL — ABNORMAL LOW (ref 4.22–5.81)
RDW: 13 % (ref 11.5–15.5)
WBC: 9 10*3/uL (ref 4.0–10.5)
nRBC: 0 % (ref 0.0–0.2)

## 2021-02-02 LAB — GLUCOSE, CAPILLARY
Glucose-Capillary: 110 mg/dL — ABNORMAL HIGH (ref 70–99)
Glucose-Capillary: 115 mg/dL — ABNORMAL HIGH (ref 70–99)
Glucose-Capillary: 138 mg/dL — ABNORMAL HIGH (ref 70–99)
Glucose-Capillary: 138 mg/dL — ABNORMAL HIGH (ref 70–99)

## 2021-02-02 LAB — BRAIN NATRIURETIC PEPTIDE: B Natriuretic Peptide: 29.3 pg/mL (ref 0.0–100.0)

## 2021-02-02 LAB — D-DIMER, QUANTITATIVE: D-Dimer, Quant: 1.02 ug/mL-FEU — ABNORMAL HIGH (ref 0.00–0.50)

## 2021-02-02 LAB — MAGNESIUM: Magnesium: 2.4 mg/dL (ref 1.7–2.4)

## 2021-02-02 LAB — C-REACTIVE PROTEIN: CRP: 0.7 mg/dL (ref <1.0)

## 2021-02-02 NOTE — Progress Notes (Signed)
Pt on 40L and 90% upon arrival.  RT readjusted back to previous settings.  Pt dropped sat a little but WOB is good and pt feels "fine".  RT will continue to monitor as needed.

## 2021-02-02 NOTE — Progress Notes (Signed)
Physical Therapy Treatment Patient Details Name: Kurt Williams MRN: [id number] DOB: Mar 13, 1951 Today's Date: 02/02/2021    History of Present Illness Pt is a 70 y.o. male admitted 02/03/2021 with SOB and cough; workup for acute hypoxic respiratory failure due to COVID-19 pneumonitis; requiring heated HFNC. No evidence of DVT. PMH includes CAD, OSA on CPAP.   PT Comments    Pt progressing with mobility; respiratory status remains tenuous on 30L O2 HHFNC at 60% FiO2. Pt performed multiple bouts of standing therex, requiring 3-5-min seated rest to recover between sets. SpO2 down to 71% on final set requiring addition of 15+L O2 NRB to recover to SpO2 85%. Pt fatigued, but remains motivated to participate and regain PLOF. Pt has HEP handout to continue exercising in his room. Will continue to follow acutely.   Follow Up Recommendations  No PT follow up;Supervision - Intermittent (pending progress)     Equipment Recommendations   (TBD)    Recommendations for Other Services       Precautions / Restrictions Precautions Precautions: Fall;Other (comment) Precaution Comments: Watch SpO2 on HHFNC Restrictions Weight Bearing Restrictions: No    Mobility  Bed Mobility               General bed mobility comments: Pt received sitting in recliner    Transfers Overall transfer level: Independent Equipment used: None Transfers: Sit to/from [employer] transfer comment: Pt with good O2/line management; multiple sit<>stands throughout session from recliner  Ambulation/Gait Ambulation/Gait assistance: Supervision   Assistive device: None       General Gait Details: Distance limited by HHFNC/lines; pt with good management of lines with steps forwards/backwards and marching in place   Stairs             Wheelchair Mobility    Modified Rankin (Stroke Patients Only)       Balance Overall balance assessment: No apparent balance deficits (not formally  assessed)                                          Cognition Arousal/Alertness: Awake/alert Behavior During Therapy: WFL for tasks assessed/performed;Flat affect Overall Cognitive Status: Within Functional Limits for tasks assessed                                        Exercises Other Exercises Other Exercises: 1) 5x sit<>stands + 5x sit<>stands; 2) 15x standing calf raise; 3) 15x standing calf raise; 4) 12x standing march -- SpO2 down to 71% on 30L O2 HHFNC, consistently requiring 3-5-min seated rest to recover between exercise sets    General Comments General comments (skin integrity, edema, etc.): Resting SpO2 93% on 30L O2 HHFNC at 60% FiO2. SpO2 down to 71%, pt requiring addition of 15+L O2 NRB to recover after final set of therex. HR 70s-80s. Educ pt on NRB use, pt reports, ""My doctor told me to keep on when I exercise and sleep"      Pertinent Vitals/Pain Pain Assessment: No/denies pain    Home Living                      Prior Function            PT Goals (current goals can now  be found in the care plan section) Progress towards PT goals: Progressing toward goals    Frequency    Min 3X/week      PT Plan Current plan remains appropriate    Co-evaluation              AM-PAC PT "6 Clicks" Mobility   Outcome Measure  Help needed turning from your back to your side while in a flat bed without using bedrails?: None Help needed moving from lying on your back to sitting on the side of a flat bed without using bedrails?: None Help needed moving to and from a bed to a chair (including a wheelchair)?: None Help needed standing up from a chair using your arms (e.g., wheelchair or bedside chair)?: None Help needed to walk in hospital room?: A Little Help needed climbing 3-5 steps with a railing? : A Little 6 Click Score: 22    End of Session Equipment Utilized During Treatment: Oxygen Activity Tolerance: Patient  tolerated treatment well;Treatment limited secondary to medical complications (Comment) Patient left: in chair;with call bell/phone within reach Nurse Communication: Mobility status PT Visit Diagnosis: Other abnormalities of gait and mobility (R26.89)     Time: 1442-1510 PT Time Calculation (min) (ACUTE ONLY): 28 min  Charges:  $Therapeutic Exercise: 23-37 mins                     Mabeline Caras, PT, DPT Acute Rehabilitation Services  Pager [phone] Office [place] 02/02/2021, 4:33 PM

## 2021-02-02 NOTE — Progress Notes (Signed)
Pt unable to wear cpap d/t O2 needs.  Pt wearing HHFNC for extra pressure since cpap is not an option at this time.  RT will continue to monitor.

## 2021-02-03 DIAGNOSIS — J9601 Acute respiratory failure with hypoxia: Secondary | ICD-10-CM | POA: Diagnosis not present

## 2021-02-03 DIAGNOSIS — U071 COVID-19: Secondary | ICD-10-CM | POA: Diagnosis not present

## 2021-02-03 LAB — CULTURE, BLOOD (ROUTINE X 2)
Culture: NO GROWTH
Culture: NO GROWTH
Specimen Description: ADEQUATE

## 2021-02-03 LAB — GLUCOSE, CAPILLARY
Glucose-Capillary: 123 mg/dL — ABNORMAL HIGH (ref 70–99)
Glucose-Capillary: 115 mg/dL — ABNORMAL HIGH (ref 70–99)
Glucose-Capillary: 135 mg/dL — ABNORMAL HIGH (ref 70–99)
Glucose-Capillary: 138 mg/dL — ABNORMAL HIGH (ref 70–99)

## 2021-02-03 LAB — BASIC METABOLIC PANEL
Anion gap: 8 (ref 5–15)
BUN: 27 mg/dL — ABNORMAL HIGH (ref 8–23)
CO2: 22 mmol/L (ref 22–32)
Calcium: 9.3 mg/dL (ref 8.9–10.3)
Chloride: 104 mmol/L (ref 98–111)
Creatinine, Ser: 1.38 mg/dL — ABNORMAL HIGH (ref 0.61–1.24)
GFR, Estimated: 55 mL/min — ABNORMAL LOW (ref >60)
Glucose, Bld: 111 mg/dL — ABNORMAL HIGH (ref 70–99)
Potassium: 4.4 mmol/L (ref 3.5–5.1)
Sodium: 134 mmol/L — ABNORMAL LOW (ref 135–145)

## 2021-02-03 LAB — BRAIN NATRIURETIC PEPTIDE: B Natriuretic Peptide: 23.7 pg/mL (ref 0.0–100.0)

## 2021-02-03 MED ORDER — FUROSEMIDE 40 MG PO TABS
40.0000 mg | ORAL_TABLET | Freq: Once | ORAL | Status: AC
  Administered 2021-02-03: 40 mg via ORAL
  Filled 2021-02-03: qty 1

## 2021-02-03 NOTE — Plan of Care (Signed)

## 2021-02-03 NOTE — Progress Notes (Signed)
PROGRESS NOTE                                                                                                                                                                                                             Patient Demographics:    Kurt Williams, is a 70 y.o. male, DOB - 1951/02/05, KKX:[id number]  Outpatient Primary MD for the patient is Shelda Pal, DO    LOS - 5  Admit date - 02/06/2021    Chief Complaint  Patient presents with  . Covid Positive       Brief Narrative (HPI from H&P) - [name] is a 70 y.o. male with medical history significant of CAD, OSA on CPAP.  Pt presents to the ED with c/o SOB and cough ongoing for 5-6 days, he is unvaccinated, he was diagnosed with severe hypoxic Resp failure due to Covid PNA.   Subjective:   Patient in bed, appears comfortable, denies any headache, no fever, no chest pain or pressure, improving shortness of breath , no abdominal pain. No focal weakness.   Assessment  & Plan :     1. Acute Hypoxic Resp. Failure due to Acute Covid 19 Viral Pneumonitis during the ongoing 2020 Covid 19 Pandemic - he is unvaccinated and unfortunately has incurred severe parenchymal lung injury due to COVID-19 pneumonia/ARDS.  He is being treated with high-dose IV steroids, Remdesivir along with Actemra.  Also on moderate dose Lovenox overall remains extremely tenuous, gradually improving will continue to monitor closely.  Encouraged the patient to sit up in chair in the daytime use I-S and flutter valve for pulmonary toiletry and then prone in bed when at night.  Will advance activity and titrate down oxygen as possible.   SpO2: 93 % O2 Flow Rate (L/min): 30 L/min FiO2 (%): 80 %  Recent Labs  Lab 01/27/2021 2209 01/19/2021 2220 01/29/21 0311 01/29/21 0624 01/29/21 0741 01/30/21 0141 01/30/21 0616 01/31/21 0330 01/31/21 0331 02/01/21 0156 02/02/21 0028 02/03/21 0723   WBC 5.4  --   --  4.0  --  2.0*  --  3.8*  --  5.9 9.0  --   HGB 12.3*  --   --  12.0*  --  12.1*  --  11.7*  --  12.2* 12.0*  --   HCT 37.8*  --   --  37.7*  --  38.0*  --  36.6*  --  37.6* 36.7*  --   PLT 114*  --   --  127*  --  143*  --  179  --  195 238  --   CRP 1.8*  --   --  2.0*  --   --  1.4* 0.9  --  0.7 0.7  --   BNP  --   --   --   --    < > 38.4  --   --  23.2 21.2 29.3 23.7  DDIMER 0.87*  --   --  1.55*  --   --  1.26* 0.96*  --  0.98* 1.02*  --   PROCALCITON <0.10  --   --   --   --  <0.10  --  <0.10  --  <0.10 <0.10  --   AST 58*  --   --  62*  --  60*  --  62*  --  53* 43*  --   ALT 33  --   --  37  --  42  --  53*  --  54* 49*  --   ALKPHOS 49  --   --  55  --  52  --  49  --  49 51  --   BILITOT 0.6  --   --  0.6  --  0.6  --  0.8  --  1.1 0.7  --   ALBUMIN 3.7  --   --  3.3*  --  3.4*  --  3.0*  --  3.1* 3.1*  --   LATICACIDVEN 2.3*  --  2.4*  --   --   --   --   --   --   --   --   --   SARSCOV2NAA  --  POSITIVE*  --   --   --   --   --   --   --   --   --   --    < > = values in this interval not displayed.    2.  High D-dimer due to intense inflammation.  On moderate dose Lovenox, negative leg ultrasound.  3.  CAD.  Chest pain-free currently on aspirin and statin for secondary prevention.  Stable echocardiogram EF 60%, no WMA. EKG Nonacute.  4. AKI on ? CKD 3 A.  Baseline creatinine around 1.4. resolved after IVF, few rales on 02/03/21 small dose Lasix.  5.  OSA.  CPAP nightly - ordered, if he can tolerate with max Fio2 .      Condition - Extremely Guarded  Family Communication  : Daughter and wife [phone] on 01/29/21 in detail, updated on tenuous condition and poor prognosis. Daughter updated again 01/30/2021, 01/31/21, 02/01/21, 02/02/21, 02/03/21.  Code Status :  Full  Consults  :  None  PUD Prophylaxis : PPI   Procedures  :     Echocardiogram - 1. Left ventricular ejection fraction, by estimation, is 60 to 65%. The left ventricle has normal  function. The left ventricle has no regional Marlin motion abnormalities. There is mild left ventricular hypertrophy. Left ventricular diastolic parameters are consistent with Grade I diastolic dysfunction (impaired relaxation).  2. Right ventricular systolic function is normal. The right ventricular size is normal.  3. The mitral valve is normal in structure. No evidence of mitral valve regurgitation. No evidence of mitral stenosis.  4. The aortic valve is normal in structure.  Aortic valve regurgitation is not visualized. No aortic stenosis is present.  5. The inferior vena cava is normal in size with greater than 50% respiratory variability, suggesting right atrial pressure of 3 mmHg.  Leg ultrasound - No DVT      Disposition Plan  :    Status is: Inpatient  Remains inpatient appropriate because:IV treatments appropriate due to intensity of illness or inability to take PO  Dispo: The patient is from: Home              Anticipated d/c is to: Home              Anticipated d/c date is: > 3 days              Patient currently is not medically stable to d/c.   Difficult to place patient No  DVT Prophylaxis  :  Lovenox    Lab Results  Component Value Date   PLT 238 02/02/2021    Diet :   Diet Order            Diet Heart Room service appropriate? Yes; Fluid consistency: Thin  Diet effective now                  Inpatient Medications  Scheduled Meds:  . aspirin  81 mg Oral Daily  . enoxaparin (LOVENOX) injection  55 mg Subcutaneous Q24H  . furosemide  40 mg Oral Once  . insulin aspart  0-9 Units Subcutaneous TID AC & HS  . melatonin  5 mg Oral QHS  . methylPREDNISolone (SOLU-MEDROL) injection  40 mg Intravenous Q12H  . pantoprazole  40 mg Oral Daily  . rosuvastatin  5 mg Oral Daily  . tamsulosin  0.4 mg Oral Daily   Continuous Infusions:  PRN Meds:.acetaminophen, nitroGLYCERIN, [DISCONTINUED] ondansetron **OR** ondansetron (ZOFRAN) IV  Antibiotics  :    Anti-infectives  (From admission, onward)   Start     Dose/Rate Route Frequency Ordered Stop   01/30/21 1800  remdesivir 100 mg in sodium chloride 0.9 % 100 mL IVPB  Status:  Discontinued       "Followed by" Linked Group Details   100 mg 200 mL/hr over 30 Minutes Intravenous Daily 02/06/2021 2311 01/29/21 1041   01/29/21 1600  remdesivir 100 mg in sodium chloride 0.9 % 100 mL IVPB       "Followed by" Linked Group Details   100 mg 200 mL/hr over 30 Minutes Intravenous Daily 01/29/21 1041 02/01/21 1001   01/17/2021 2339  remdesivir 100 mg in sodium chloride 0.9 % 100 mL IVPB       "Followed by" Linked Group Details   100 mg 200 mL/hr over 30 Minutes Intravenous  Once 02/04/2021 2311 01/29/21 0028   02/09/2021 2315  remdesivir 100 mg in sodium chloride 0.9 % 100 mL IVPB       "Followed by" Linked Group Details   100 mg 200 mL/hr over 30 Minutes Intravenous  Once 01/29/2021 2311 02/09/2021 2353       Time Spent in minutes  30   Lala Lund M.D on 02/03/2021 at 9:59 AM  To page go to [email or web address]   [facility] -  Office  [phone]    See all Orders from today for further details    Objective:   Vitals:   02/03/21 0408 02/03/21 0500 02/03/21 0724 02/03/21 0854  BP: 107/72  (!) 147/67   Pulse: 67  69   Resp: 20  20   Temp: 98.5  F (36.9 C)  98.4 F (36.9 C)   TempSrc: Oral  Oral   SpO2: (!) 87% 90% 91% 93%  Weight:      Height:        Wt Readings from Last 3 Encounters:  01/29/21 107.6 kg  02/01/2021 108.9 kg  01/09/21 114.8 kg     Intake/Output Summary (Last 24 hours) at 02/03/2021 0959 Last data filed at 02/03/2021 0521 Gross per 24 hour  Intake --  Output 1780 ml  Net -1780 ml     Physical Exam  Awake Alert, No new F.N deficits, Normal affect [place].AT,PERRAL Supple Neck,No JVD, No cervical lymphadenopathy appriciated.  Symmetrical Chest Steuck movement, Good air movement bilaterally, few rales RRR,No Gallops, Rubs or new Murmurs, No Parasternal Heave +ve B.Sounds, Abd  Soft, No tenderness, No organomegaly appriciated, No rebound - guarding or rigidity. No Cyanosis, Clubbing or edema, No new Rash or bruise    Data Review:    CBC Recent Labs  Lab 01/29/21 0624 01/30/21 0141 01/31/21 0330 02/01/21 0156 02/02/21 0028  WBC 4.0 2.0* 3.8* 5.9 9.0  HGB 12.0* 12.1* 11.7* 12.2* 12.0*  HCT 37.7* 38.0* 36.6* 37.6* 36.7*  PLT 127* 143* 179 195 238  MCV 92.0 93.1 92.2 91.9 91.8  MCH 29.3 29.7 29.5 29.8 30.0  MCHC 31.8 31.8 32.0 32.4 32.7  RDW 13.4 13.4 13.1 13.1 13.0  LYMPHSABS 0.4* 0.6* 0.7 0.7 0.7  MONOABS 0.3 0.2 0.6 0.6 0.6  EOSABS 0.0 0.0 0.0 0.0 0.0  BASOSABS 0.0 0.0 0.0 0.0 0.0    Recent Labs  Lab 01/15/2021 2209 01/29/21 0311 01/29/21 0624 01/29/21 0741 01/29/21 0741 01/30/21 0141 01/30/21 0616 01/31/21 0330 01/31/21 0331 02/01/21 0156 02/02/21 0028 02/03/21 0723  NA 132*  --  136  --   --  134*  --  135  --  135 134* 134*  K 4.2  --  4.8  --   --  4.8  --  4.9  --  4.5 4.4 4.4  CL 99  --  102  --   --  102  --  102  --  100 102 104  CO2 23  --  24  --   --  20*  --  26  --  25 23 22   GLUCOSE 149*  --  141*  --   --  126*  --  141*  --  135* 134* 111*  BUN 14  --  11  --   --  16  --  22  --  31* 31* 27*  CREATININE 1.49*  --  1.43*  --   --  1.16  --  1.44*  --  1.60* 1.50* 1.38*  CALCIUM 9.0  --  9.4  --   --  9.3  --  9.2  --  9.1 9.1 9.3  AST 58*  --  62*  --   --  60*  --  62*  --  53* 43*  --   ALT 33  --  37  --   --  42  --  53*  --  54* 49*  --   ALKPHOS 49  --  55  --   --  52  --  49  --  49 51  --   BILITOT 0.6  --  0.6  --   --  0.6  --  0.8  --  1.1 0.7  --   ALBUMIN 3.7  --  3.3*  --   --  3.4*  --  3.0*  --  3.1* 3.1*  --   MG  --   --   --  2.0  --  2.1  --  2.3  --  2.4 2.4  --   CRP 1.8*  --  2.0*  --   --   --  1.4* 0.9  --  0.7 0.7  --   DDIMER 0.87*  --  1.55*  --   --   --  1.26* 0.96*  --  0.98* 1.02*  --   PROCALCITON <0.10  --   --   --   --  <0.10  --  <0.10  --  <0.10 <0.10  --   LATICACIDVEN 2.3* 2.4*   --   --   --   --   --   --   --   --   --   --   HGBA1C  --   --  6.0*  --   --   --   --   --   --   --   --   --   BNP  --   --   --  43.4   < > 38.4  --   --  23.2 21.2 29.3 23.7   < > = values in this interval not displayed.    ------------------------------------------------------------------------------------------------------------------ No results for input(s): CHOL, HDL, LDLCALC, TRIG, CHOLHDL, LDLDIRECT in the last 72 hours.  Lab Results  Component Value Date   HGBA1C 6.0 (H) 01/29/2021   ------------------------------------------------------------------------------------------------------------------ No results for input(s): TSH, T4TOTAL, T3FREE, THYROIDAB in the last 72 hours.  Invalid input(s): FREET3  Cardiac Enzymes No results for input(s): CKMB, TROPONINI, MYOGLOBIN in the last 168 hours.  Invalid input(s): CK ------------------------------------------------------------------------------------------------------------------    Component Value Date/Time   BNP 23.7 02/03/2021 5573    Micro Results Recent Results (from the past 240 hour(s))  Blood Culture (routine x 2)     Status: None   Collection Time: 01/22/2021 10:08 PM   Specimen: BLOOD  Result Value Ref Range Status   Specimen Description   Final    BLOOD Blood Culture results may not be optimal due to an excessive volume of blood received in culture bottles Performed at [facility], [address] [place], [place] [address]    Special Requests   Final    BOTTLES DRAWN AEROBIC AND ANAEROBIC LEFT ANTECUBITAL Performed at [facility], [address] [place], [place] [address]    Culture   Final    NO GROWTH 5 DAYS Performed at [facility], [place] [address] [place], [place] [address]    Report Status 02/03/2021 FINAL  Final  Resp Panel by RT-PCR (Flu A&B, Covid) Nasopharyngeal Swab     Status: Abnormal   Collection Time: 01/21/2021 10:20 PM   Specimen: Nasopharyngeal Swab;  Nasopharyngeal(NP) swabs in vial transport medium  Result Value Ref Range Status   SARS Coronavirus 2 by RT PCR POSITIVE (A) NEGATIVE Final    Comment: RESULT CALLED TO, READ BACK BY AND VERIFIED WITH: ALFRED DILLARD RN AT 8315 BY AV CALLED ON 01/27/2021 BY A VASSER (NOTE) SARS-CoV-2 target nucleic acids are DETECTED.  The SARS-CoV-2 RNA is generally detectable in upper respiratory specimens during the acute phase of infection. Positive results are indicative of the presence of the identified virus, but do not rule out bacterial infection or co-infection with other pathogens not detected by the test. Clinical correlation with  patient history and other diagnostic information is necessary to determine patient infection status. The expected result is Negative.  Fact Sheet for Patients: EntrepreneurPulse.com.au  Fact Sheet for Healthcare Providers: IncredibleEmployment.be  This test is not yet approved or cleared by the [place] FDA and  has been authorized for detection and/or diagnosis of SARS-CoV-2 by FDA under an Emergency Use Authorization (EUA).  This EUA will remain in ef fect (meaning this test can be used) for the duration of  the COVID-19 declaration under Section 564(b)(1) of the Act, 21 U.S.C. section 360bbb-3(b)(1), unless the authorization is terminated or revoked sooner.     Influenza A by PCR NEGATIVE NEGATIVE Final   Influenza B by PCR NEGATIVE NEGATIVE Final    Comment: (NOTE) The Xpert Xpress SARS-CoV-2/FLU/RSV plus assay is intended as an aid in the diagnosis of influenza from Nasopharyngeal swab specimens and should not be used as a sole basis for treatment. Nasal washings and aspirates are unacceptable for Xpert Xpress SARS-CoV-2/FLU/RSV testing.  Fact Sheet for Patients: EntrepreneurPulse.com.au  Fact Sheet for Healthcare Providers: IncredibleEmployment.be  This test is not yet  approved or cleared by the [place] FDA and has been authorized for detection and/or diagnosis of SARS-CoV-2 by FDA under an Emergency Use Authorization (EUA). This EUA will remain in effect (meaning this test can be used) for the duration of the COVID-19 declaration under Section 564(b)(1) of the Act, 21 U.S.C. section 360bbb-3(b)(1), unless the authorization is terminated or revoked.  Performed at [facility], [address] [place], [place] [address]   Blood Culture (routine x 2)     Status: None   Collection Time: 01/18/2021 10:23 PM   Specimen: BLOOD  Result Value Ref Range Status   Specimen Description   Final    BLOOD Blood Culture adequate volume Performed at [facility], [address] [place], [place] [address]    Special Requests   Final    BOTTLES DRAWN AEROBIC AND ANAEROBIC RIGHT ANTECUBITAL Performed at [facility], [address] [place], [place] [address]    Culture   Final    NO GROWTH 5 DAYS Performed at [facility], [place] [address] [place], [place] [address]    Report Status 02/03/2021 FINAL  Final  MRSA PCR Screening     Status: None   Collection Time: 01/30/21  6:08 PM   Specimen: Nasal Mucosa; Nasopharyngeal  Result Value Ref Range Status   MRSA by PCR NEGATIVE NEGATIVE Final    Comment:        The GeneXpert MRSA Assay (FDA approved for NASAL specimens only), is one component of a comprehensive MRSA colonization surveillance program. It is not intended to diagnose MRSA infection nor to guide or monitor treatment for MRSA infections. Performed at [facility], [place] [address], [place], [place] [address]     Radiology Reports DG Chest Klein 1 View  Result Date: 02/01/2021 CLINICAL DATA:  Shortness of breath.  COVID positive. EXAM: PORTABLE CHEST 1 VIEW COMPARISON:  01/31/2021 FINDINGS: Stable cardiomediastinal contours. Persistent low lung volumes. Mild diffuse increase interstitial markings  noted bilaterally. More focal airspace disease is noted within the left lung base. The visualized osseous structures appear intact. IMPRESSION: 1. Stable low lung volumes and increased interstitial markings bilaterally. 2. More focal airspace disease within the left lung base, unchanged from previous exam. Electronically Signed   By: Kerby Moors M.D.   On: 02/01/2021 09:20  DG Chest Port 1 View  Result Date: 01/31/2021 CLINICAL DATA:  70 year old male with a history of shortness of breath and COVID EXAM: PORTABLE CHEST 1 VIEW COMPARISON:  01/29/2021, 01/24/2021 FINDINGS: Cardiomediastinal silhouette unchanged. Low lung volumes persist. Reticulonodular opacities of the lungs, with increasing airspace disease at the left lung base and development of air bronchograms. No pneumothorax. No large pleural effusion. IMPRESSION: Increasing interstitial and airspace disease of the bilateral lungs particularly at the left lung base. Given the air bronchograms, lobar pneumonia/aspiration pneumonia also considered in addition to viral pneumonia. Electronically Signed   By: Corrie Mckusick D.O.   On: 01/31/2021 08:29   DG Chest Port 1 View  Result Date: 01/29/2021 CLINICAL DATA:  Shortness of breath, COVID-19 EXAM: PORTABLE CHEST 1 VIEW COMPARISON:  Portable exam 0820 hours compared to 01/31/2021 FINDINGS: Normal heart size, mediastinal contours, and pulmonary vascularity. Patchy infiltrates in mid to lower LEFT lung and minimally at RIGHT base consistent with multifocal pneumonia. RIGHT lung infiltrate is perhaps slightly improved since previous exam. No pleural effusion or pneumothorax. Bones unremarkable. IMPRESSION: BILATERAL pulmonary infiltrates consistent with multifocal pneumonia, perhaps minimally improved in RIGHT lung. Electronically Signed   By: Lavonia Dana M.D.   On: 01/29/2021 08:34   DG Chest Port 1 View  Result Date: 02/01/2021 CLINICAL DATA:  Cough.  Coronavirus infection. EXAM: PORTABLE CHEST 1  VIEW COMPARISON:  11/24/2017 FINDINGS: Heart size is normal. Chronic aortic atherosclerosis and tortuosity as seen previously. Hazy pulmonary infiltrates in the mid and lower lungs consistent with viral pneumonia. No dense consolidation. Chronic elevation of the left hemidiaphragm. No evidence of effusion. No acute bone finding. IMPRESSION: 1. Hazy pulmonary infiltrates in the mid and lower lungs consistent with viral pneumonia. No dense consolidation. 2. Aortic atherosclerosis. Electronically Signed   By: Nelson Chimes M.D.   On: 01/27/2021 22:21   VAS [place] LOWER EXTREMITY VENOUS (DVT)  Result Date: 01/31/2021  Lower Venous DVT Study Indications: Covid, d-dimer.  Anticoagulation: Lovenox. Comparison Study: No prior studies. Performing Technologist: Darlin Coco RDMS  Examination Guidelines: A complete evaluation includes B-mode imaging, spectral Doppler, color Doppler, and power Doppler as needed of all accessible portions of each vessel. Bilateral testing is considered an integral part of a complete examination. Limited examinations for reoccurring indications may be performed as noted. The reflux portion of the exam is performed with the patient in reverse Trendelenburg.  +---------+---------------+---------+-----------+----------+-------------------+ RIGHT    CompressibilityPhasicitySpontaneityPropertiesThrombus Aging      +---------+---------------+---------+-----------+----------+-------------------+ CFV      Full           Yes      Yes                                      +---------+---------------+---------+-----------+----------+-------------------+ SFJ      Full                                                             +---------+---------------+---------+-----------+----------+-------------------+ FV Prox  Full                                                             +---------+---------------+---------+-----------+----------+-------------------+  FV Mid   Full                                                              +---------+---------------+---------+-----------+----------+-------------------+ FV DistalFull                                                             +---------+---------------+---------+-----------+----------+-------------------+ PFV      Full                                                             +---------+---------------+---------+-----------+----------+-------------------+ POP      Full           No       Yes                  Rouleaux flow noted +---------+---------------+---------+-----------+----------+-------------------+ PTV      Full                                                             +---------+---------------+---------+-----------+----------+-------------------+ PERO     Full                                                             +---------+---------------+---------+-----------+----------+-------------------+   +---------+---------------+---------+-----------+----------+-------------------+ LEFT     CompressibilityPhasicitySpontaneityPropertiesThrombus Aging      +---------+---------------+---------+-----------+----------+-------------------+ CFV      Full           Yes      Yes                                      +---------+---------------+---------+-----------+----------+-------------------+ SFJ      Full                                                             +---------+---------------+---------+-----------+----------+-------------------+ FV Prox  Full                                                             +---------+---------------+---------+-----------+----------+-------------------+ FV Mid   Full                                                             +---------+---------------+---------+-----------+----------+-------------------+  FV DistalFull                                                              +---------+---------------+---------+-----------+----------+-------------------+ PFV      Full                                                             +---------+---------------+---------+-----------+----------+-------------------+ POP      Full           No       Yes                  Rouleaux flow noted +---------+---------------+---------+-----------+----------+-------------------+ PTV      Full                                                             +---------+---------------+---------+-----------+----------+-------------------+ PERO     Full                                                             +---------+---------------+---------+-----------+----------+-------------------+     Summary: RIGHT: - There is no evidence of deep vein thrombosis in the lower extremity.  - No cystic structure found in the popliteal fossa.  LEFT: - There is no evidence of deep vein thrombosis in the lower extremity.  - No cystic structure found in the popliteal fossa.  *See table(s) above for measurements and observations. Electronically signed by Jamelle Haring on 01/31/2021 at 10:53:05 AM.    Final    ECHOCARDIOGRAM LIMITED  Result Date: 01/30/2021    ECHOCARDIOGRAM LIMITED REPORT   Patient Name:   BAKER MORONTA Sommerfeld Date of Exam: 01/30/2021 Medical Rec #:  [id number]    Height:       72.0 in Accession #:    [id number]   Weight:       237.2 lb Date of Birth:  [birth year]    BSA:          2.291 m Patient Age:    88 years     BP:           103/65 mmHg Patient Gender: M            HR:           52 bpm. Exam Location:  Inpatient Procedure: Limited Echo, Cardiac Doppler and Color Doppler Indications:    CHF-Acute diastolic  History:        Patient has no prior history of Echocardiogram examinations.                 CAD; Risk Factors:Sleep Apnea. CKD. GERD.  Sonographer:    Clayton Lefort RDCS (AE) Referring Phys: [id number] [place]  1. Left ventricular ejection  fraction, by estimation, is 60 to  65%. The left ventricle has normal function. The left ventricle has no regional [place] motion abnormalities. There is mild left ventricular hypertrophy. Left ventricular diastolic parameters are consistent with Grade I diastolic dysfunction (impaired relaxation).  2. Right ventricular systolic function is normal. The right ventricular size is normal.  3. The mitral valve is normal in structure. No evidence of mitral valve regurgitation. No evidence of mitral stenosis.  4. The aortic valve is normal in structure. Aortic valve regurgitation is not visualized. No aortic stenosis is present.  5. The inferior vena cava is normal in size with greater than 50% respiratory variability, suggesting right atrial pressure of 3 mmHg. FINDINGS  Left Ventricle: Left ventricular ejection fraction, by estimation, is 60 to 65%. The left ventricle has normal function. The left ventricle has no regional [place] motion abnormalities. The left ventricular internal cavity size was normal in size. There is  mild left ventricular hypertrophy. Left ventricular diastolic parameters are consistent with Grade I diastolic dysfunction (impaired relaxation). Right Ventricle: The right ventricular size is normal. No increase in right ventricular Joerger thickness. Right ventricular systolic function is normal. Left Atrium: Left atrial size was normal in size. Right Atrium: Right atrial size was normal in size. Pericardium: There is no evidence of pericardial effusion. Mitral Valve: The mitral valve is normal in structure. No evidence of mitral valve stenosis. Tricuspid Valve: The tricuspid valve is normal in structure. Tricuspid valve regurgitation is not demonstrated. No evidence of tricuspid stenosis. Aortic Valve: The aortic valve is normal in structure. Aortic valve regurgitation is not visualized. No aortic stenosis is present. Aortic valve mean gradient measures 5.0 mmHg. Aortic valve peak gradient measures 7.1 mmHg. Aortic valve area, by VTI  measures 2.23 cm. Pulmonic Valve: The pulmonic valve was normal in structure. Pulmonic valve regurgitation is not visualized. No evidence of pulmonic stenosis. Aorta: The aortic root is normal in size and structure. Venous: The inferior vena cava is normal in size with greater than 50% respiratory variability, suggesting right atrial pressure of 3 mmHg. IAS/Shunts: No atrial level shunt detected by color flow Doppler. LEFT VENTRICLE PLAX 2D LVIDd:         4.30 cm  Diastology LVIDs:         2.80 cm  LV e' medial:    5.22 cm/s LV PW:         1.40 cm  LV E/e' medial:  10.7 LV IVS:        1.20 cm  LV e' lateral:   6.85 cm/s LVOT diam:     2.00 cm  LV E/e' lateral: 8.2 LV SV:         69 LV SV Index:   30 LVOT Area:     3.14 cm  IVC IVC diam: 1.80 cm LEFT ATRIUM         Index LA diam:    3.20 cm 1.40 cm/m  AORTIC VALVE AV Area (Vmax):    2.65 cm AV Area (Vmean):   2.20 cm AV Area (VTI):     2.23 cm AV Vmax:           133.00 cm/s AV Vmean:          103.000 cm/s AV VTI:            0.310 m AV Peak Grad:      7.1 mmHg AV Mean Grad:      5.0 mmHg LVOT Vmax:  112.00 cm/s LVOT Vmean:        72.000 cm/s LVOT VTI:          0.220 m LVOT/AV VTI ratio: 0.71  AORTA Ao Root diam: 3.70 cm Ao Asc diam:  3.30 cm MITRAL VALVE MV Area (PHT): 2.44 cm    SHUNTS MV Decel Time: 311 msec    Systemic VTI:  0.22 m MV E velocity: 56.10 cm/s  Systemic Diam: 2.00 cm MV A velocity: 50.60 cm/s MV E/A ratio:  1.11 Candee Furbish MD Electronically signed by Candee Furbish MD Signature Date/Time: 01/30/2021/3:26:43 PM    Final

## 2021-02-03 NOTE — Progress Notes (Signed)
Pt unable to wear cpap d/t O2 needs, pt currently on HHFNC 30L 80%. RT will continue to monitor

## 2021-02-04 DIAGNOSIS — J9601 Acute respiratory failure with hypoxia: Secondary | ICD-10-CM | POA: Diagnosis not present

## 2021-02-04 DIAGNOSIS — U071 COVID-19: Secondary | ICD-10-CM | POA: Diagnosis not present

## 2021-02-04 LAB — CBC WITH DIFFERENTIAL/PLATELET
Abs Immature Granulocytes: 0.16 10*3/uL — ABNORMAL HIGH (ref 0.00–0.07)
Basophils Absolute: 0 10*3/uL (ref 0.0–0.1)
Basophils Relative: 0 %
Eosinophils Absolute: 0 10*3/uL (ref 0.0–0.5)
Eosinophils Relative: 0 %
HCT: 36.1 % — ABNORMAL LOW (ref 39.0–52.0)
Hemoglobin: 12.3 g/dL — ABNORMAL LOW (ref 13.0–17.0)
Immature Granulocytes: 2 %
Lymphocytes Relative: 4 %
Lymphs Abs: 0.3 10*3/uL — ABNORMAL LOW (ref 0.7–4.0)
MCH: 30.6 pg (ref 26.0–34.0)
MCHC: 34.1 g/dL (ref 30.0–36.0)
MCV: 89.8 fL (ref 80.0–100.0)
Monocytes Absolute: 0.2 10*3/uL (ref 0.1–1.0)
Monocytes Relative: 2 %
Neutro Abs: 8 10*3/uL — ABNORMAL HIGH (ref 1.7–7.7)
Neutrophils Relative %: 92 %
Platelets: 312 10*3/uL (ref 150–400)
RBC: 4.02 MIL/uL — ABNORMAL LOW (ref 4.22–5.81)
RDW: 13.1 % (ref 11.5–15.5)
WBC: 8.7 10*3/uL (ref 4.0–10.5)
nRBC: 0.3 % — ABNORMAL HIGH (ref 0.0–0.2)

## 2021-02-04 LAB — BRAIN NATRIURETIC PEPTIDE: B Natriuretic Peptide: 16.9 pg/mL (ref 0.0–100.0)

## 2021-02-04 LAB — COMPREHENSIVE METABOLIC PANEL
ALT: 45 U/L — ABNORMAL HIGH (ref 0–44)
AST: 41 U/L (ref 15–41)
Albumin: 3.2 g/dL — ABNORMAL LOW (ref 3.5–5.0)
Alkaline Phosphatase: 54 U/L (ref 38–126)
Anion gap: 10 (ref 5–15)
BUN: 28 mg/dL — ABNORMAL HIGH (ref 8–23)
CO2: 23 mmol/L (ref 22–32)
Calcium: 9.2 mg/dL (ref 8.9–10.3)
Chloride: 100 mmol/L (ref 98–111)
Creatinine, Ser: 1.46 mg/dL — ABNORMAL HIGH (ref 0.61–1.24)
GFR, Estimated: 51 mL/min — ABNORMAL LOW (ref >60)
Glucose, Bld: 141 mg/dL — ABNORMAL HIGH (ref 70–99)
Potassium: 4.7 mmol/L (ref 3.5–5.1)
Sodium: 133 mmol/L — ABNORMAL LOW (ref 135–145)
Total Bilirubin: 0.7 mg/dL (ref 0.3–1.2)
Total Protein: 6.4 g/dL — ABNORMAL LOW (ref 6.5–8.1)

## 2021-02-04 LAB — GLUCOSE, CAPILLARY
Glucose-Capillary: 117 mg/dL — ABNORMAL HIGH (ref 70–99)
Glucose-Capillary: 128 mg/dL — ABNORMAL HIGH (ref 70–99)
Glucose-Capillary: 136 mg/dL — ABNORMAL HIGH (ref 70–99)
Glucose-Capillary: 124 mg/dL — ABNORMAL HIGH (ref 70–99)

## 2021-02-04 LAB — MAGNESIUM: Magnesium: 2.5 mg/dL — ABNORMAL HIGH (ref 1.7–2.4)

## 2021-02-04 LAB — D-DIMER, QUANTITATIVE: D-Dimer, Quant: 1.7 ug/mL-FEU — ABNORMAL HIGH (ref 0.00–0.50)

## 2021-02-04 LAB — C-REACTIVE PROTEIN: CRP: 0.5 mg/dL (ref <1.0)

## 2021-02-04 NOTE — Progress Notes (Signed)
PROGRESS NOTE                                                                                                                                                                                                             Patient Demographics:    Kurt Williams, is a 70 y.o. male, DOB - 1951/09/29, IEP:[id number]  Outpatient Primary MD for the patient is Shelda Pal, DO    LOS - 6  Admit date - 01/15/2021    Chief Complaint  Patient presents with  . Covid Positive       Brief Narrative (HPI from H&P) - Kurt Williams is a 70 y.o. male with medical history significant of CAD, OSA on CPAP.  Pt presents to the ED with c/o SOB and cough ongoing for 5-6 days, he is unvaccinated, he was diagnosed with severe hypoxic Resp failure due to Covid PNA.   Subjective:   No acute issues or events overnight denies nausea vomiting diarrhea constipation headache fevers or chills.  Dyspnea with exertion is ongoing but moderately improving from last week.   Assessment  & Plan :     Acute Hypoxic Resp. Failure due to Acute Covid 19 Viral pneumonia ARDS resolving -Unvaccinated with prolonged illness prior to admission -Moderate to severe disease given imaging and symptoms -Continues to improve, likely prolonged hypoxic phase given age and delayed care -Completed Remdesivir/Actemra.   - Continues on moderate dose Lovenox overall remains extremely tenuous -Continue incentive spirometry, flutter, proning, early ambulation  SpO2: 92 % O2 Flow Rate (L/min): 30 L/min FiO2 (%): 80 %  Recent Labs  Lab 01/13/2021 2209 02/06/2021 2220 01/29/21 0311 01/29/21 0624 01/30/21 0141 01/30/21 0616 01/31/21 0330 01/31/21 0331 02/01/21 0156 02/02/21 0028 02/03/21 0723 02/04/21 0055  WBC 5.4  --   --    < > 2.0*  --  3.8*  --  5.9 9.0  --  8.7  HGB 12.3*  --   --    < > 12.1*  --  11.7*  --  12.2* 12.0*  --  12.3*  HCT 37.8*  --   --    < >  38.0*  --  36.6*  --  37.6* 36.7*  --  36.1*  PLT 114*  --   --    < > 143*  --  179  --  195 238  --  312  CRP 1.8*  --   --    < >  --  1.4* 0.9  --  0.7 0.7  --  0.5  BNP  --   --   --    < > 38.4  --   --  23.2 21.2 29.3 23.7 16.9  DDIMER 0.87*  --   --    < >  --  1.26* 0.96*  --  0.98* 1.02*  --  1.70*  PROCALCITON <0.10  --   --   --  <0.10  --  <0.10  --  <0.10 <0.10  --   --   AST 58*  --   --    < > 60*  --  62*  --  53* 43*  --  41  ALT 33  --   --    < > 42  --  53*  --  54* 49*  --  45*  ALKPHOS 49  --   --    < > 52  --  49  --  49 51  --  54  BILITOT 0.6  --   --    < > 0.6  --  0.8  --  1.1 0.7  --  0.7  ALBUMIN 3.7  --   --    < > 3.4*  --  3.0*  --  3.1* 3.1*  --  3.2*  LATICACIDVEN 2.3*  --  2.4*  --   --   --   --   --   --   --   --   --   SARSCOV2NAA  --  POSITIVE*  --   --   --   --   --   --   --   --   --   --    < > = values in this interval not displayed.    Elevated D-dimer due to intense inflammation.  On moderate dose Lovenox, negative leg ultrasound.  CAD.  Chest pain-free currently on aspirin and statin for secondary prevention.  Stable echocardiogram EF 60%, no WMA. EKG Nonacute.  AKI on ? CKD 3 A.  Baseline creatinine around 1.4. resolved after IVF Lab Results  Component Value Date   CREATININE 1.46 (H) 02/04/2021   CREATININE 1.38 (H) 02/03/2021   CREATININE 1.50 (H) 02/02/2021   OSA.  CPAP nightly - ordered, if he can tolerate with max Fio2 .      Condition - Extremely Guarded  Family Communication  : Daughter and wife [phone] on 01/29/21 in detail, updated on tenuous condition and poor prognosis. Daughter updated again 01/30/2021, 01/31/21, 02/01/21, 02/02/21, 02/03/21.  Code Status :  Full  Consults  :  None  PUD Prophylaxis : PPI   Procedures  :     Echocardiogram - 1. Left ventricular ejection fraction, by estimation, is 60 to 65%. The left ventricle has normal function. The left ventricle has no regional Rabanal motion abnormalities.  There is mild left ventricular hypertrophy. Left ventricular diastolic parameters are consistent with Grade I diastolic dysfunction (impaired relaxation).  2. Right ventricular systolic function is normal. The right ventricular size is normal.  3. The mitral valve is normal in structure. No evidence of mitral valve regurgitation. No evidence of mitral stenosis.  4. The aortic valve is normal in structure. Aortic valve regurgitation is not visualized. No aortic stenosis is present.  5. The inferior vena cava is normal in size  with greater than 50% respiratory variability, suggesting right atrial pressure of 3 mmHg.  Leg ultrasound - No DVT      Disposition Plan  :    Status is: Inpatient  Remains inpatient appropriate because:IV treatments appropriate due to intensity of illness or inability to take PO  Dispo: The patient is from: Home              Anticipated d/c is to: Home              Anticipated d/c date is: > 3 days              Patient currently is not medically stable to d/c.   Difficult to place patient No  DVT Prophylaxis  :  Lovenox    Lab Results  Component Value Date   PLT 312 02/04/2021    Diet :   Diet Order            Diet Heart Room service appropriate? Yes; Fluid consistency: Thin  Diet effective now                  Inpatient Medications  Scheduled Meds:  . aspirin  81 mg Oral Daily  . enoxaparin (LOVENOX) injection  55 mg Subcutaneous Q24H  . insulin aspart  0-9 Units Subcutaneous TID AC & HS  . melatonin  5 mg Oral QHS  . methylPREDNISolone (SOLU-MEDROL) injection  40 mg Intravenous Q12H  . pantoprazole  40 mg Oral Daily  . rosuvastatin  5 mg Oral Daily  . tamsulosin  0.4 mg Oral Daily   Continuous Infusions:  PRN Meds:.acetaminophen, nitroGLYCERIN, [DISCONTINUED] ondansetron **OR** ondansetron (ZOFRAN) IV  Antibiotics  :    Anti-infectives (From admission, onward)   Start     Dose/Rate Route Frequency Ordered Stop   01/30/21 1800   remdesivir 100 mg in sodium chloride 0.9 % 100 mL IVPB  Status:  Discontinued       "Followed by" Linked Group Details   100 mg 200 mL/hr over 30 Minutes Intravenous Daily 02/06/2021 2311 01/29/21 1041   01/29/21 1600  remdesivir 100 mg in sodium chloride 0.9 % 100 mL IVPB       "Followed by" Linked Group Details   100 mg 200 mL/hr over 30 Minutes Intravenous Daily 01/29/21 1041 02/01/21 1001   02/07/2021 2339  remdesivir 100 mg in sodium chloride 0.9 % 100 mL IVPB       "Followed by" Linked Group Details   100 mg 200 mL/hr over 30 Minutes Intravenous  Once 01/25/2021 2311 01/29/21 0028   01/31/2021 2315  remdesivir 100 mg in sodium chloride 0.9 % 100 mL IVPB       "Followed by" Linked Group Details   100 mg 200 mL/hr over 30 Minutes Intravenous  Once 01/25/2021 2311 02/05/2021 2353       Time Spent in minutes  Meno DO 02/04/2021 at 8:08 AM  To page go to [email or web address]   [facility] -  Office  [phone]   See all Orders from today for further details    Objective:   Vitals:   02/04/21 0500 02/04/21 0536 02/04/21 0727 02/04/21 0734  BP:   120/69   Pulse:   80 84  Resp:  (!) 24 20 (!) 22  Temp:   98 F (36.7 C)   TempSrc:   Oral   SpO2: 91%  92% 92%  Weight:      Height:  Wt Readings from Last 3 Encounters:  01/29/21 107.6 kg  01/27/2021 108.9 kg  01/09/21 114.8 kg     Intake/Output Summary (Last 24 hours) at 02/04/2021 0808 Last data filed at 02/04/2021 0431 Gross per 24 hour  Intake --  Output 2800 ml  Net -2800 ml     Physical Exam  Awake Alert, No new F.N deficits, Normal affect [place].AT,PERRAL Supple Neck,No JVD, No cervical lymphadenopathy appriciated.  Symmetrical Chest [place] movement, Good air movement bilaterally, few rales RRR,No Gallops, Rubs or new Murmurs, No Parasternal Heave +ve B.Sounds, Abd Soft, No tenderness, No organomegaly appriciated, No rebound - guarding or rigidity. No Cyanosis, Clubbing or edema, No new Rash  or bruise    Data Review:   CBC Recent Labs  Lab 01/30/21 0141 01/31/21 0330 02/01/21 0156 02/02/21 0028 02/04/21 0055  WBC 2.0* 3.8* 5.9 9.0 8.7  HGB 12.1* 11.7* 12.2* 12.0* 12.3*  HCT 38.0* 36.6* 37.6* 36.7* 36.1*  PLT 143* 179 195 238 312  MCV 93.1 92.2 91.9 91.8 89.8  MCH 29.7 29.5 29.8 30.0 30.6  MCHC 31.8 32.0 32.4 32.7 34.1  RDW 13.4 13.1 13.1 13.0 13.1  LYMPHSABS 0.6* 0.7 0.7 0.7 0.3*  MONOABS 0.2 0.6 0.6 0.6 0.2  EOSABS 0.0 0.0 0.0 0.0 0.0  BASOSABS 0.0 0.0 0.0 0.0 0.0    Recent Labs  Lab 01/13/2021 2209 01/29/21 0311 01/29/21 0624 01/29/21 0741 01/30/21 0141 01/30/21 0616 01/31/21 0330 01/31/21 0331 02/01/21 0156 02/02/21 0028 02/03/21 0723 02/04/21 0055  NA 132*  --  136  --  134*  --  135  --  135 134* 134* 133*  K 4.2  --  4.8  --  4.8  --  4.9  --  4.5 4.4 4.4 4.7  CL 99  --  102  --  102  --  102  --  100 102 104 100  CO2 23  --  24  --  20*  --  26  --  25 23 22 23   GLUCOSE 149*  --  141*  --  126*  --  141*  --  135* 134* 111* 141*  BUN 14  --  11  --  16  --  22  --  31* 31* 27* 28*  CREATININE 1.49*  --  1.43*  --  1.16  --  1.44*  --  1.60* 1.50* 1.38* 1.46*  CALCIUM 9.0  --  9.4  --  9.3  --  9.2  --  9.1 9.1 9.3 9.2  AST 58*  --  62*  --  60*  --  62*  --  53* 43*  --  41  ALT 33  --  37  --  42  --  53*  --  54* 49*  --  45*  ALKPHOS 49  --  55  --  52  --  49  --  49 51  --  54  BILITOT 0.6  --  0.6  --  0.6  --  0.8  --  1.1 0.7  --  0.7  ALBUMIN 3.7  --  3.3*  --  3.4*  --  3.0*  --  3.1* 3.1*  --  3.2*  MG  --   --   --    < > 2.1  --  2.3  --  2.4 2.4  --  2.5*  CRP 1.8*  --  2.0*  --   --  1.4* 0.9  --  0.7 0.7  --  0.5  DDIMER 0.87*  --  1.55*  --   --  1.26* 0.96*  --  0.98* 1.02*  --  1.70*  PROCALCITON <0.10  --   --   --  <0.10  --  <0.10  --  <0.10 <0.10  --   --   LATICACIDVEN 2.3* 2.4*  --   --   --   --   --   --   --   --   --   --   HGBA1C  --   --  6.0*  --   --   --   --   --   --   --   --   --   BNP  --   --   --     < > 38.4  --   --  23.2 21.2 29.3 23.7 16.9   < > = values in this interval not displayed.    ------------------------------------------------------------------------------------------------------------------ No results for input(s): CHOL, HDL, LDLCALC, TRIG, CHOLHDL, LDLDIRECT in the last 72 hours.  Lab Results  Component Value Date   HGBA1C 6.0 (H) 01/29/2021   ------------------------------------------------------------------------------------------------------------------ No results for input(s): TSH, T4TOTAL, T3FREE, THYROIDAB in the last 72 hours.  Invalid input(s): FREET3  Cardiac Enzymes No results for input(s): CKMB, TROPONINI, MYOGLOBIN in the last 168 hours.  Invalid input(s): CK ------------------------------------------------------------------------------------------------------------------    Component Value Date/Time   BNP 16.9 02/04/2021 0055    Micro Results Recent Results (from the past 240 hour(s))  Blood Culture (routine x 2)     Status: None   Collection Time: 01/22/2021 10:08 PM   Specimen: BLOOD  Result Value Ref Range Status   Specimen Description   Final    BLOOD Blood Culture results may not be optimal due to an excessive volume of blood received in culture bottles Performed at [facility], [address] [place], [place] [address]    Special Requests   Final    BOTTLES DRAWN AEROBIC AND ANAEROBIC LEFT ANTECUBITAL Performed at [facility], [address] [place], [place] [address]    Culture   Final    NO GROWTH 5 DAYS Performed at [facility], [place] [address] [place], [place] [address]    Report Status 02/03/2021 FINAL  Final  Resp Panel by RT-PCR (Flu A&B, Covid) Nasopharyngeal Swab     Status: Abnormal   Collection Time: 01/25/2021 10:20 PM   Specimen: Nasopharyngeal Swab; Nasopharyngeal(NP) swabs in vial transport medium  Result Value Ref Range Status   SARS Coronavirus 2 by RT PCR POSITIVE (A) NEGATIVE Final     Comment: RESULT CALLED TO, READ BACK BY AND VERIFIED WITH: ALFRED DILLARD RN AT 3762 BY AV CALLED ON 02/01/2021 BY A VASSER (NOTE) SARS-CoV-2 target nucleic acids are DETECTED.  The SARS-CoV-2 RNA is generally detectable in upper respiratory specimens during the acute phase of infection. Positive results are indicative of the presence of the identified virus, but do not rule out bacterial infection or co-infection with other pathogens not detected by the test. Clinical correlation with patient history and other diagnostic information is necessary to determine patient infection status. The expected result is Negative.  Fact Sheet for Patients: EntrepreneurPulse.com.au  Fact Sheet for Healthcare Providers: IncredibleEmployment.be  This test is not yet approved or cleared by the [place] FDA and  has been authorized for detection and/or diagnosis of SARS-CoV-2 by FDA under an Emergency Use Authorization (  EUA).  This EUA will remain in ef fect (meaning this test can be used) for the duration of  the COVID-19 declaration under Section 564(b)(1) of the Act, 21 U.S.C. section 360bbb-3(b)(1), unless the authorization is terminated or revoked sooner.     Influenza A by PCR NEGATIVE NEGATIVE Final   Influenza B by PCR NEGATIVE NEGATIVE Final    Comment: (NOTE) The Xpert Xpress SARS-CoV-2/FLU/RSV plus assay is intended as an aid in the diagnosis of influenza from Nasopharyngeal swab specimens and should not be used as a sole basis for treatment. Nasal washings and aspirates are unacceptable for Xpert Xpress SARS-CoV-2/FLU/RSV testing.  Fact Sheet for Patients: EntrepreneurPulse.com.au  Fact Sheet for Healthcare Providers: IncredibleEmployment.be  This test is not yet approved or cleared by the [place] FDA and has been authorized for detection and/or diagnosis of SARS-CoV-2 by FDA under an Emergency  Use Authorization (EUA). This EUA will remain in effect (meaning this test can be used) for the duration of the COVID-19 declaration under Section 564(b)(1) of the Act, 21 U.S.C. section 360bbb-3(b)(1), unless the authorization is terminated or revoked.  Performed at [facility], [address] [place], [place] [address]   Blood Culture (routine x 2)     Status: None   Collection Time: 01/19/2021 10:23 PM   Specimen: BLOOD  Result Value Ref Range Status   Specimen Description   Final    BLOOD Blood Culture adequate volume Performed at [facility], [address] [place], [place] [address]    Special Requests   Final    BOTTLES DRAWN AEROBIC AND ANAEROBIC RIGHT ANTECUBITAL Performed at [facility], [address] [place], [place] [address]    Culture   Final    NO GROWTH 5 DAYS Performed at [facility], [address] [place], [place] [address]    Report Status 02/03/2021 FINAL  Final  MRSA PCR Screening     Status: None   Collection Time: 01/30/21  6:08 PM   Specimen: Nasal Mucosa; Nasopharyngeal  Result Value Ref Range Status   MRSA by PCR NEGATIVE NEGATIVE Final    Comment:        The GeneXpert MRSA Assay (FDA approved for NASAL specimens only), is one component of a comprehensive MRSA colonization surveillance program. It is not intended to diagnose MRSA infection nor to guide or monitor treatment for MRSA infections. Performed at [facility], [place] [address] [place], [place] [address]     Radiology Reports DG Chest [place] View 1 View  Result Date: 02/01/2021 CLINICAL DATA:  Shortness of breath.  COVID positive. EXAM: PORTABLE CHEST 1 VIEW COMPARISON:  01/31/2021 FINDINGS: Stable cardiomediastinal contours. Persistent low lung volumes. Mild diffuse increase interstitial markings noted bilaterally. More focal airspace disease is noted within the left lung base. The visualized osseous structures appear intact. IMPRESSION:  1. Stable low lung volumes and increased interstitial markings bilaterally. 2. More focal airspace disease within the left lung base, unchanged from previous exam. Electronically Signed   By: Kerby Moors M.D.   On: 02/01/2021 09:20   DG Chest Port 1 View  Result Date: 01/31/2021 CLINICAL DATA:  70 year old male with a history of shortness of breath and COVID EXAM: PORTABLE CHEST 1 VIEW COMPARISON:  01/29/2021, 01/30/2021 FINDINGS: Cardiomediastinal silhouette unchanged. Low lung volumes persist. Reticulonodular opacities of the lungs, with increasing airspace disease at the left lung base and development of air bronchograms. No pneumothorax. No large  pleural effusion. IMPRESSION: Increasing interstitial and airspace disease of the bilateral lungs particularly at the left lung base. Given the air bronchograms, lobar pneumonia/aspiration pneumonia also considered in addition to viral pneumonia. Electronically Signed   By: Corrie Mckusick D.O.   On: 01/31/2021 08:29   DG Chest Port 1 View  Result Date: 01/29/2021 CLINICAL DATA:  Shortness of breath, COVID-19 EXAM: PORTABLE CHEST 1 VIEW COMPARISON:  Portable exam 0820 hours compared to 02/03/2021 FINDINGS: Normal heart size, mediastinal contours, and pulmonary vascularity. Patchy infiltrates in mid to lower LEFT lung and minimally at RIGHT base consistent with multifocal pneumonia. RIGHT lung infiltrate is perhaps slightly improved since previous exam. No pleural effusion or pneumothorax. Bones unremarkable. IMPRESSION: BILATERAL pulmonary infiltrates consistent with multifocal pneumonia, perhaps minimally improved in RIGHT lung. Electronically Signed   By: Lavonia Dana M.D.   On: 01/29/2021 08:34   DG Chest Port 1 View  Result Date: 02/04/2021 CLINICAL DATA:  Cough.  Coronavirus infection. EXAM: PORTABLE CHEST 1 VIEW COMPARISON:  11/24/2017 FINDINGS: Heart size is normal. Chronic aortic atherosclerosis and tortuosity as seen previously. Hazy pulmonary  infiltrates in the mid and lower lungs consistent with viral pneumonia. No dense consolidation. Chronic elevation of the left hemidiaphragm. No evidence of effusion. No acute bone finding. IMPRESSION: 1. Hazy pulmonary infiltrates in the mid and lower lungs consistent with viral pneumonia. No dense consolidation. 2. Aortic atherosclerosis. Electronically Signed   By: Nelson Chimes M.D.   On: 01/23/2021 22:21   VAS [place] LOWER EXTREMITY VENOUS (DVT)  Result Date: 01/31/2021  Lower Venous DVT Study Indications: Covid, d-dimer.  Anticoagulation: Lovenox. Comparison Study: No prior studies. Performing Technologist: Darlin Coco RDMS  Examination Guidelines: A complete evaluation includes B-mode imaging, spectral Doppler, color Doppler, and power Doppler as needed of all accessible portions of each vessel. Bilateral testing is considered an integral part of a complete examination. Limited examinations for reoccurring indications may be performed as noted. The reflux portion of the exam is performed with the patient in reverse Trendelenburg.  +---------+---------------+---------+-----------+----------+-------------------+ RIGHT    CompressibilityPhasicitySpontaneityPropertiesThrombus Aging      +---------+---------------+---------+-----------+----------+-------------------+ CFV      Full           Yes      Yes                                      +---------+---------------+---------+-----------+----------+-------------------+ SFJ      Full                                                             +---------+---------------+---------+-----------+----------+-------------------+ FV Prox  Full                                                             +---------+---------------+---------+-----------+----------+-------------------+ FV Mid   Full                                                             +---------+---------------+---------+-----------+----------+-------------------+  FV  DistalFull                                                             +---------+---------------+---------+-----------+----------+-------------------+ PFV      Full                                                             +---------+---------------+---------+-----------+----------+-------------------+ POP      Full           No       Yes                  Rouleaux flow noted +---------+---------------+---------+-----------+----------+-------------------+ PTV      Full                                                             +---------+---------------+---------+-----------+----------+-------------------+ PERO     Full                                                             +---------+---------------+---------+-----------+----------+-------------------+   +---------+---------------+---------+-----------+----------+-------------------+ LEFT     CompressibilityPhasicitySpontaneityPropertiesThrombus Aging      +---------+---------------+---------+-----------+----------+-------------------+ CFV      Full           Yes      Yes                                      +---------+---------------+---------+-----------+----------+-------------------+ SFJ      Full                                                             +---------+---------------+---------+-----------+----------+-------------------+ FV Prox  Full                                                             +---------+---------------+---------+-----------+----------+-------------------+ FV Mid   Full                                                             +---------+---------------+---------+-----------+----------+-------------------+ FV DistalFull                                                             +---------+---------------+---------+-----------+----------+-------------------+  PFV      Full                                                              +---------+---------------+---------+-----------+----------+-------------------+ POP      Full           No       Yes                  Rouleaux flow noted +---------+---------------+---------+-----------+----------+-------------------+ PTV      Full                                                             +---------+---------------+---------+-----------+----------+-------------------+ PERO     Full                                                             +---------+---------------+---------+-----------+----------+-------------------+     Summary: RIGHT: - There is no evidence of deep vein thrombosis in the lower extremity.  - No cystic structure found in the popliteal fossa.  LEFT: - There is no evidence of deep vein thrombosis in the lower extremity.  - No cystic structure found in the popliteal fossa.  *See table(s) above for measurements and observations. Electronically signed by Jamelle Haring on 01/31/2021 at 10:53:05 AM.    Final    ECHOCARDIOGRAM LIMITED  Result Date: 01/30/2021    ECHOCARDIOGRAM LIMITED REPORT   Patient Name:   JAXSON ANGLIN Burby Date of Exam: 01/30/2021 Medical Rec #:  [id number]    Height:       72.0 in Accession #:    [id number]   Weight:       237.2 lb Date of Birth:  [birth year]    BSA:          2.291 m Patient Age:    82 years     BP:           103/65 mmHg Patient Gender: M            HR:           52 bpm. Exam Location:  Inpatient Procedure: Limited Echo, Cardiac Doppler and Color Doppler Indications:    CHF-Acute diastolic  History:        Patient has no prior history of Echocardiogram examinations.                 CAD; Risk Factors:Sleep Apnea. CKD. GERD.  Sonographer:    Clayton Lefort RDCS (AE) Referring Phys: [id number]osh [place]  1. Left ventricular ejection fraction, by estimation, is 60 to 65%. The left ventricle has normal function. The left ventricle has no regional Kotarski motion abnormalities. There is mild left ventricular hypertrophy. Left  ventricular diastolic parameters are consistent with Grade I diastolic dysfunction (impaired relaxation).  2. Right ventricular systolic function is normal. The right ventricular size is normal.  3. The mitral valve is normal  in structure. No evidence of mitral valve regurgitation. No evidence of mitral stenosis.  4. The aortic valve is normal in structure. Aortic valve regurgitation is not visualized. No aortic stenosis is present.  5. The inferior vena cava is normal in size with greater than 50% respiratory variability, suggesting right atrial pressure of 3 mmHg. FINDINGS  Left Ventricle: Left ventricular ejection fraction, by estimation, is 60 to 65%. The left ventricle has normal function. The left ventricle has no regional Bressman motion abnormalities. The left ventricular internal cavity size was normal in size. There is  mild left ventricular hypertrophy. Left ventricular diastolic parameters are consistent with Grade I diastolic dysfunction (impaired relaxation). Right Ventricle: The right ventricular size is normal. No increase in right ventricular Arizmendi thickness. Right ventricular systolic function is normal. Left Atrium: Left atrial size was normal in size. Right Atrium: Right atrial size was normal in size. Pericardium: There is no evidence of pericardial effusion. Mitral Valve: The mitral valve is normal in structure. No evidence of mitral valve stenosis. Tricuspid Valve: The tricuspid valve is normal in structure. Tricuspid valve regurgitation is not demonstrated. No evidence of tricuspid stenosis. Aortic Valve: The aortic valve is normal in structure. Aortic valve regurgitation is not visualized. No aortic stenosis is present. Aortic valve mean gradient measures 5.0 mmHg. Aortic valve peak gradient measures 7.1 mmHg. Aortic valve area, by VTI measures 2.23 cm. Pulmonic Valve: The pulmonic valve was normal in structure. Pulmonic valve regurgitation is not visualized. No evidence of pulmonic stenosis.  Aorta: The aortic root is normal in size and structure. Venous: The inferior vena cava is normal in size with greater than 50% respiratory variability, suggesting right atrial pressure of 3 mmHg. IAS/Shunts: No atrial level shunt detected by color flow Doppler. LEFT VENTRICLE PLAX 2D LVIDd:         4.30 cm  Diastology LVIDs:         2.80 cm  LV e' medial:    5.22 cm/s LV PW:         1.40 cm  LV E/e' medial:  10.7 LV IVS:        1.20 cm  LV e' lateral:   6.85 cm/s LVOT diam:     2.00 cm  LV E/e' lateral: 8.2 LV SV:         69 LV SV Index:   30 LVOT Area:     3.14 cm  IVC IVC diam: 1.80 cm LEFT ATRIUM         Index LA diam:    3.20 cm 1.40 cm/m  AORTIC VALVE AV Area (Vmax):    2.65 cm AV Area (Vmean):   2.20 cm AV Area (VTI):     2.23 cm AV Vmax:           133.00 cm/s AV Vmean:          103.000 cm/s AV VTI:            0.310 m AV Peak Grad:      7.1 mmHg AV Mean Grad:      5.0 mmHg LVOT Vmax:         112.00 cm/s LVOT Vmean:        72.000 cm/s LVOT VTI:          0.220 m LVOT/AV VTI ratio: 0.71  AORTA Ao Root diam: 3.70 cm Ao Asc diam:  3.30 cm MITRAL VALVE MV Area (PHT): 2.44 cm    SHUNTS MV Decel Time: 311 msec  Systemic VTI:  0.22 m MV E velocity: 56.10 cm/s  Systemic Diam: 2.00 cm MV A velocity: 50.60 cm/s MV E/A ratio:  1.11 Candee Furbish MD Electronically signed by Candee Furbish MD Signature Date/Time: 01/30/2021/3:26:43 PM    Final

## 2021-02-04 NOTE — Progress Notes (Signed)
Occupational Therapy Treatment Patient Details Name: Kurt Williams MRN: [id number] DOB: 05/14/1951 Today's Date: 02/04/2021    History of present illness Pt is a 70 y.o. male admitted 01/25/2021 with SOB and cough; workup for acute hypoxic respiratory failure due to COVID-19 pneumonitis; requiring heated HFNC. No evidence of DVT. PMH includes CAD, OSA on CPAP.   OT comments  Pt is independent with HEP, use of IS and flutter valve.  He was able to complete HEP with Sp02 84-92%.  On 30L 80% Fi02 HHNC.  He was instructed how to incorporate pursed lip breathing during exercise and was able to demonstrate understanding.    Follow Up Recommendations  LTACH;No OT follow up    Equipment Recommendations  None recommended by OT    Recommendations for Other Services      Precautions / Restrictions Precautions Precautions: Fall;Other (comment) Precaution Comments: Watch SpO2 on HHFNC       Mobility Bed Mobility               General bed mobility comments: pt sitting up in recliner    Transfers Overall transfer level: Independent                    Balance Overall balance assessment: No apparent balance deficits (not formally assessed)                                         ADL either performed or assessed with clinical judgement   ADL                                               Vision       Perception     Praxis      Cognition Arousal/Alertness: Awake/alert Behavior During Therapy: WFL for tasks assessed/performed;Flat affect Overall Cognitive Status: Within Functional Limits for tasks assessed                                          Exercises Other Exercises Other Exercises: Pt performing all of his exercises upon OT entrance.  He reports he performs them 2x/day and he sits for 3-5 mins between each exercise. Other Exercises: instructed pt in pursed lip breathing coordinated with the exertion during  exercise.  He was able to return demonstration   Shoulder Instructions       General Comments Resting Sp02 97% with pt on 30L 80% Fi02.  With activity Sp02 decreased to 84%, but he was able to rebound to low 90s within 1.5 mins with pursed lip breathing    Pertinent Vitals/ Pain       Pain Assessment: No/denies pain  Home Living                                          Prior Functioning/Environment              Frequency  Min 2X/week        Progress Toward Goals  OT Goals(current goals can now be found in the care plan section)  Progress  towards OT goals: Progressing toward goals     Plan Discharge plan remains appropriate    Co-evaluation                 AM-PAC OT "6 Clicks" Daily Activity     Outcome Measure   Help from another person eating meals?: None Help from another person taking care of personal grooming?: A Little Help from another person toileting, which includes using toliet, bedpan, or urinal?: A Little Help from another person bathing (including washing, rinsing, drying)?: A Little Help from another person to put on and taking off regular upper body clothing?: A Little Help from another person to put on and taking off regular lower body clothing?: A Little 6 Click Score: 19    End of Session Equipment Utilized During Treatment: Oxygen  OT Visit Diagnosis: Other (comment)   Activity Tolerance Patient tolerated treatment well   Patient Left in chair;with call bell/phone within reach;with nursing/sitter in room   Nurse Communication Mobility status        Time: 0160-1093 OT Time Calculation (min): 32 min  Charges: OT General Charges $OT Visit: 1 Visit OT Treatments $Therapeutic Exercise: 23-37 mins  Kurt Williams., OTR/L Acute Rehabilitation Services Pager [phone] Office [phone]    Kurt Williams 02/04/2021, 5:45 PM

## 2021-02-05 DIAGNOSIS — U071 COVID-19: Secondary | ICD-10-CM | POA: Diagnosis not present

## 2021-02-05 DIAGNOSIS — J9601 Acute respiratory failure with hypoxia: Secondary | ICD-10-CM | POA: Diagnosis not present

## 2021-02-05 LAB — CBC WITH DIFFERENTIAL/PLATELET
Abs Immature Granulocytes: 0.14 10*3/uL — ABNORMAL HIGH (ref 0.00–0.07)
Basophils Absolute: 0 10*3/uL (ref 0.0–0.1)
Basophils Relative: 0 %
Eosinophils Absolute: 0 10*3/uL (ref 0.0–0.5)
Eosinophils Relative: 0 %
HCT: 41.6 % (ref 39.0–52.0)
Hemoglobin: 13.4 g/dL (ref 13.0–17.0)
Immature Granulocytes: 1 %
Lymphocytes Relative: 4 %
Lymphs Abs: 0.4 10*3/uL — ABNORMAL LOW (ref 0.7–4.0)
MCH: 29.6 pg (ref 26.0–34.0)
MCHC: 32.2 g/dL (ref 30.0–36.0)
MCV: 91.8 fL (ref 80.0–100.0)
Monocytes Absolute: 0.2 10*3/uL (ref 0.1–1.0)
Monocytes Relative: 2 %
Neutro Abs: 9.5 10*3/uL — ABNORMAL HIGH (ref 1.7–7.7)
Neutrophils Relative %: 93 %
Platelets: 359 10*3/uL (ref 150–400)
RBC: 4.53 MIL/uL (ref 4.22–5.81)
RDW: 13.2 % (ref 11.5–15.5)
WBC: 10.3 10*3/uL (ref 4.0–10.5)
nRBC: 0 % (ref 0.0–0.2)

## 2021-02-05 LAB — GLUCOSE, CAPILLARY
Glucose-Capillary: 117 mg/dL — ABNORMAL HIGH (ref 70–99)
Glucose-Capillary: 129 mg/dL — ABNORMAL HIGH (ref 70–99)
Glucose-Capillary: 126 mg/dL — ABNORMAL HIGH (ref 70–99)
Glucose-Capillary: 147 mg/dL — ABNORMAL HIGH (ref 70–99)

## 2021-02-05 LAB — COMPREHENSIVE METABOLIC PANEL
ALT: 48 U/L — ABNORMAL HIGH (ref 0–44)
AST: 49 U/L — ABNORMAL HIGH (ref 15–41)
Albumin: 3.4 g/dL — ABNORMAL LOW (ref 3.5–5.0)
Alkaline Phosphatase: 61 U/L (ref 38–126)
Anion gap: 10 (ref 5–15)
BUN: 26 mg/dL — ABNORMAL HIGH (ref 8–23)
CO2: 25 mmol/L (ref 22–32)
Calcium: 9.7 mg/dL (ref 8.9–10.3)
Chloride: 99 mmol/L (ref 98–111)
Creatinine, Ser: 1.49 mg/dL — ABNORMAL HIGH (ref 0.61–1.24)
GFR, Estimated: 50 mL/min — ABNORMAL LOW (ref >60)
Glucose, Bld: 128 mg/dL — ABNORMAL HIGH (ref 70–99)
Potassium: 5.1 mmol/L (ref 3.5–5.1)
Sodium: 134 mmol/L — ABNORMAL LOW (ref 135–145)
Total Bilirubin: 1.1 mg/dL (ref 0.3–1.2)
Total Protein: 7.1 g/dL (ref 6.5–8.1)

## 2021-02-05 LAB — BRAIN NATRIURETIC PEPTIDE: B Natriuretic Peptide: 18.2 pg/mL (ref 0.0–100.0)

## 2021-02-05 LAB — D-DIMER, QUANTITATIVE: D-Dimer, Quant: 2.67 ug/mL-FEU — ABNORMAL HIGH (ref 0.00–0.50)

## 2021-02-05 LAB — C-REACTIVE PROTEIN: CRP: 0.5 mg/dL (ref <1.0)

## 2021-02-05 LAB — MAGNESIUM: Magnesium: 2.4 mg/dL (ref 1.7–2.4)

## 2021-02-05 NOTE — Progress Notes (Signed)
Physical Therapy Treatment Patient Details Name: Kurt Williams MRN: [id number] DOB: Jul 04, 1951 Today's Date: 02/05/2021    History of Present Illness Pt is a 70 y.o. male admitted 01/20/2021 with SOB and cough; workup for acute hypoxic respiratory failure due to COVID-19 pneumonitis; requiring heated HFNC. No evidence of DVT. PMH includes CAD, OSA on CPAP.   PT Comments    Pt endorses increased fatigue this session. Session focused on brief periods of standing (pt able to tolerate ~30-40sec standing before needing seated rest break) and seated LE therex. Pt remains motivated to participate. Remains limited by decreased activity tolerance and tenuous cardiorespiratory status - would benefit from LTAC due to current O2 needs with continued therapies at d/c to maximize functional mobility and independence.   Resting SpO2 98% on 30L O2 HHFNC at 100% FiO2 Down to 82% with activity, rebounding well with seated rest HR 79-95  Seated BP 111/65 Standing BP 103/64 Post-session BP 106/61    Follow Up Recommendations  LTACH;Supervision - Intermittent     Equipment Recommendations   (defer)    Recommendations for Other Services       Precautions / Restrictions Precautions Precautions: Fall;Other (comment) Precaution Comments: Watch SpO2 on HHFNC Restrictions Weight Bearing Restrictions: No    Mobility  Bed Mobility               General bed mobility comments: Pt received sitting in recliner    Transfers Overall transfer level: Needs assistance Equipment used: None Transfers: Sit to/from Stand Sit to Stand: Supervision         General transfer comment: Supervision due to c/o fatigue and initial dizziness; 3x sit<>stands during session without assist  Ambulation/Gait                 Stairs             Wheelchair Mobility    Modified Rankin (Stroke Patients Only)       Balance Overall balance assessment: No apparent balance deficits (not formally  assessed)   Sitting balance-Leahy Scale: Good       Standing balance-Leahy Scale: Fair                              Cognition Arousal/Alertness: Awake/alert Behavior During Therapy: WFL for tasks assessed/performed;Flat affect Overall Cognitive Status: Within Functional Limits for tasks assessed                                 General Comments: endorses increased fatigue this session      Exercises Other Exercises Other Exercises: Standing with pursed lip breathing 1) for 10-sec, 2) for ~40-sec -- both times pt requiring prolonged seated rest to recover SOB/fatigue Other Exercises: Seated LE therex - 1) LAQ x10, 2) seated marching x10, 3) seated heel/toe raises --- seated rest between sets to recover SOB/fatigue    General Comments General comments (skin integrity, edema, etc.): Resting SpO2 98% on 30L O2 HHFNC at 100% FiO2; with activity, SpO2 down to 82%, rebounding to >/88% with seated rest and pursed lip breathing. Pt endorses increased fatigue this session with decreased activity tolerance noted; sitting BP 111/65, standing BP 103/64, end of session BP 106/61; HR 79-95.      Pertinent Vitals/Pain Pain Assessment: No/denies pain    Home Living  Prior Function            PT Goals (current goals can now be found in the care plan section) Progress towards PT goals: Progressing toward goals (slowly; limited by fatigue, increased O2 needs and SOB this session)    Frequency    Min 3X/week      PT Plan Discharge plan needs to be updated    Co-evaluation              AM-PAC PT "6 Clicks" Mobility   Outcome Measure  Help needed turning from your back to your side while in a flat bed without using bedrails?: None Help needed moving from lying on your back to sitting on the side of a flat bed without using bedrails?: None Help needed moving to and from a bed to a chair (including a wheelchair)?: A  Little Help needed standing up from a chair using your arms (e.g., wheelchair or bedside chair)?: A Little Help needed to walk in hospital room?: A Little Help needed climbing 3-5 steps with a railing? : A Little 6 Click Score: 20    End of Session Equipment Utilized During Treatment: Oxygen Activity Tolerance: Patient tolerated treatment well;Patient limited by fatigue Patient left: in chair;with call bell/phone within reach Nurse Communication: Mobility status PT Visit Diagnosis: Other abnormalities of gait and mobility (R26.89)     Time: 3762-8315 PT Time Calculation (min) (ACUTE ONLY): 20 min  Charges:  $Therapeutic Exercise: 8-22 mins                    Mabeline Caras, PT, DPT Acute Rehabilitation Services  Pager [phone] Office [place] 02/05/2021, 3:58 PM

## 2021-02-05 NOTE — TOC Initial Note (Signed)
Transition of Care [facility]) - Initial/Assessment Note    Patient Details  Name: Kurt Williams MRN: [id number] Date of Birth: Jul 05, 1951  [facility]) CM/SW Contact:    Benard Halsted, LCSW Phone Number: 02/05/2021, 2:20 PM  Clinical Narrative:                 CSW received request to contact patient's daughter regarding rehab facilities. CSW spoke with Ayana (and her mom) and answered questions about SNF placement. CSW explained that it will depend how patient does and if he requires that level of care once his oxygen demands are less. CSW will continue to follow for needs.     Barriers to Discharge: Continued Medical Work up   Patient Goals and CMS Choice Patient states their goals for this hospitalization and ongoing recovery are:: Rehab CMS Medicare.gov Compare Post Acute Care list provided to:: Patient Represenative (must comment) Choice offered to / list presented to : Adult Children,Spouse  Expected Discharge Plan and Services   In-house Referral: Clinical Social Work     Living arrangements for the past 2 months: Single Family Home                                      Prior Living Arrangements/Services Living arrangements for the past 2 months: Single Family Home Lives with:: Spouse Patient language and need for interpreter reviewed:: Yes Do you feel safe going back to the place where you live?: Yes      Need for Family Participation in Patient Care: Yes (Comment) Care giver support system in place?: Yes (comment)   Criminal Activity/Legal Involvement Pertinent to Current Situation/Hospitalization: No - Comment as needed  Activities of Daily Living Home Assistive Devices/Equipment: None ADL Screening (condition at time of admission) Patient's cognitive ability adequate to safely complete daily activities?: Yes Is the patient deaf or have difficulty hearing?: No Does the patient have difficulty seeing, even when wearing glasses/contacts?: No Does  the patient have difficulty concentrating, remembering, or making decisions?: No Patient able to express need for assistance with ADLs?: Yes Does the patient have difficulty dressing or bathing?: No Independently performs ADLs?: Yes (appropriate for developmental age) Does the patient have difficulty walking or climbing stairs?: No Weakness of Legs: None Weakness of Arms/Hands: None  Permission Sought/Granted Permission sought to share information with : Facility Contact Representative,Family Supports Permission granted to share information with : Yes, Verbal Permission Granted  Share Information with NAME: Ayana  Permission granted to share info w AGENCY: SNFs  Permission granted to share info w Relationship: Daughter  Permission granted to share info w Contact Information: [phone]  Emotional Assessment       Orientation: : Oriented to Self,Oriented to Place,Oriented to  Time,Oriented to Situation Alcohol / Substance Use: Not Applicable Psych Involvement: No (comment)  Admission diagnosis:  Hypoxia [R09.02] Acute respiratory failure with hypoxia ([place]) [J96.01] Acute hypoxemic respiratory failure due to COVID-19 ([place]) [U07.1, J96.01] COVID-19 [U07.1] Patient Active Problem List   Diagnosis Date Noted  . Acute hypoxemic respiratory failure due to COVID-19 ([place]) 01/29/2021  . CKD (chronic kidney disease) stage 3, GFR 30-59 ml/min (H[facility]) 01/29/2021  . Noncompliance 10/15/2020  . Unilateral primary osteoarthritis, left knee 09/24/2020  . Unilateral primary osteoarthritis, right knee 09/24/2020  . Left lateral abdominal pain 12/21/2018  . URI (upper respiratory infection) 11/17/2018  . OSA on CPAP   . Onychomycosis due to  dermatophyte 07/26/2017  . Sensorineural hearing loss (SNHL), bilateral 03/29/2016  . Coronary artery disease   . GERD (gastroesophageal reflux disease)   . Headache(784.0)   . Syncope 05/21/2012  . CAD (coronary artery disease) 05/21/2012   PCP:   Shelda Pal, DO Pharmacy:   [id number] - [place], [place] - [address] [place] [address] [place] [address] Phone: [phone] Fax: [phone]     Social Determinants of Health (SDOH) Interventions    Readmission Risk Interventions No flowsheet data found.

## 2021-02-05 NOTE — Progress Notes (Signed)
PROGRESS NOTE                                                                                                                                                                                                             Patient Demographics:    Kurt Williams, is a 70 y.o. male, DOB - 1951-01-15, LEX:[id number]  Outpatient Primary MD for the patient is Shelda Pal, DO    LOS - 7  Admit date - 01/20/2021    Chief Complaint  Patient presents with  . Covid Positive       Brief Narrative (HPI from H&P) - Kurt Williams is a 70 y.o. male with medical history significant of CAD, OSA on CPAP.  Pt presents to the ED with c/o SOB and cough ongoing for 5-6 days, he is unvaccinated, he was diagnosed with severe hypoxic Resp failure due to Covid PNA.   Subjective:   No acute issues or events overnight denies nausea vomiting diarrhea constipation headache fevers or chills.  Dyspnea with exertion is ongoing but moderately improving from last week -complaining of a nonproductive cough this morning.   Assessment  & Plan :     Acute Hypoxic Resp. Failure due to Acute Covid 19 Viral pneumonia ARDS resolving -Unvaccinated with prolonged illness prior to admission -Moderate to severe disease given imaging and symptoms -Continues to improve, likely prolonged hypoxic phase given age and delayed care -Completed Remdesivir/Actemra.   - Continues on moderate dose Lovenox overall remains extremely tenuous -Continue incentive spirometry, flutter, proning, early ambulation  SpO2: 97 % O2 Flow Rate (L/min): 30 L/min FiO2 (%): 80 %  Recent Labs  Lab 01/30/21 0141 01/30/21 0616 01/31/21 0330 01/31/21 0331 02/01/21 0156 02/02/21 0028 02/03/21 0723 02/04/21 0055 02/05/21 0225  WBC 2.0*  --  3.8*  --  5.9 9.0  --  8.7 10.3  HGB 12.1*  --  11.7*  --  12.2* 12.0*  --  12.3* 13.4  HCT 38.0*  --  36.6*  --  37.6* 36.7*  --  36.1* 41.6   PLT 143*  --  179  --  195 238  --  312 359  CRP  --    < > 0.9  --  0.7 0.7  --  0.5 0.5  BNP 38.4  --   --    < >  21.2 29.3 23.7 16.9 18.2  DDIMER  --    < > 0.96*  --  0.98* 1.02*  --  1.70* 2.67*  PROCALCITON <0.10  --  <0.10  --  <0.10 <0.10  --   --   --   AST 60*  --  62*  --  53* 43*  --  41 49*  ALT 42  --  53*  --  54* 49*  --  45* 48*  ALKPHOS 52  --  49  --  49 51  --  54 61  BILITOT 0.6  --  0.8  --  1.1 0.7  --  0.7 1.1  ALBUMIN 3.4*  --  3.0*  --  3.1* 3.1*  --  3.2* 3.4*   < > = values in this interval not displayed.    Elevated D-dimer due to intense inflammation.  On moderate dose Lovenox, negative leg ultrasound.  CAD.  Chest pain-free currently on aspirin and statin for secondary prevention.  Stable echocardiogram EF 60%, no WMA. EKG Nonacute.  AKI on ? CKD 3 A.  Baseline creatinine around 1.4. resolved after IVF Lab Results  Component Value Date   CREATININE 1.49 (H) 02/05/2021   CREATININE 1.46 (H) 02/04/2021   CREATININE 1.38 (H) 02/03/2021   OSA.  CPAP nightly - ordered, if he can tolerate with max Fio2 .      Condition - Extremely Guarded  Family Communication  : Daughter and wife [phone] on 01/29/21 in detail, updated on tenuous condition and poor prognosis. Daughter updated again 01/30/2021, 01/31/21, 02/01/21, 02/02/21, 02/03/21.  Code Status :  Full  Consults  :  None  PUD Prophylaxis : PPI   Procedures  :     Echocardiogram - 1. Left ventricular ejection fraction, by estimation, is 60 to 65%. The left ventricle has normal function. The left ventricle has no regional Cretella motion abnormalities. There is mild left ventricular hypertrophy. Left ventricular diastolic parameters are consistent with Grade I diastolic dysfunction (impaired relaxation).  2. Right ventricular systolic function is normal. The right ventricular size is normal.  3. The mitral valve is normal in structure. No evidence of mitral valve regurgitation. No evidence of mitral  stenosis.  4. The aortic valve is normal in structure. Aortic valve regurgitation is not visualized. No aortic stenosis is present.  5. The inferior vena cava is normal in size with greater than 50% respiratory variability, suggesting right atrial pressure of 3 mmHg.  Leg ultrasound - No DVT      Disposition Plan  :    Status is: Inpatient  Remains inpatient appropriate because:IV treatments appropriate due to intensity of illness or inability to take PO  Dispo: The patient is from: Home              Anticipated d/c is to: LTAC              Anticipated d/c date is: 2 days              Patient currently is not medically stable to d/c.   Difficult to place patient No  DVT Prophylaxis  :  Lovenox    Lab Results  Component Value Date   PLT 359 02/05/2021    Diet :   Diet Order            Diet Heart Room service appropriate? Yes; Fluid consistency: Thin  Diet effective now  Inpatient Medications  Scheduled Meds:  . aspirin  81 mg Oral Daily  . enoxaparin (LOVENOX) injection  55 mg Subcutaneous Q24H  . insulin aspart  0-9 Units Subcutaneous TID AC & HS  . melatonin  5 mg Oral QHS  . methylPREDNISolone (SOLU-MEDROL) injection  40 mg Intravenous Q12H  . pantoprazole  40 mg Oral Daily  . rosuvastatin  5 mg Oral Daily  . tamsulosin  0.4 mg Oral Daily   Continuous Infusions:  PRN Meds:.acetaminophen, nitroGLYCERIN, [DISCONTINUED] ondansetron **OR** ondansetron (ZOFRAN) IV  Antibiotics  :    Anti-infectives (From admission, onward)   Start     Dose/Rate Route Frequency Ordered Stop   01/30/21 1800  remdesivir 100 mg in sodium chloride 0.9 % 100 mL IVPB  Status:  Discontinued       "Followed by" Linked Group Details   100 mg 200 mL/hr over 30 Minutes Intravenous Daily 02/03/2021 2311 01/29/21 1041   01/29/21 1600  remdesivir 100 mg in sodium chloride 0.9 % 100 mL IVPB       "Followed by" Linked Group Details   100 mg 200 mL/hr over 30 Minutes  Intravenous Daily 01/29/21 1041 02/01/21 1001   02/08/2021 2339  remdesivir 100 mg in sodium chloride 0.9 % 100 mL IVPB       "Followed by" Linked Group Details   100 mg 200 mL/hr over 30 Minutes Intravenous  Once 01/25/2021 2311 01/29/21 0028   01/14/2021 2315  remdesivir 100 mg in sodium chloride 0.9 % 100 mL IVPB       "Followed by" Linked Group Details   100 mg 200 mL/hr over 30 Minutes Intravenous  Once 01/20/2021 2311 01/23/2021 2353       Time Spent in minutes  [place] DO 02/05/2021 at 3:57 PM  To page go to [email or web address]   [facility] -  Office  [phone]   See all Orders from today for further details    Objective:   Vitals:   02/05/21 0755 02/05/21 0757 02/05/21 0830 02/05/21 1256  BP:  98/83  115/72  Pulse:  89  74  Resp:  20  19  Temp:  98.7 F (37.1 C)  98.1 F (36.7 C)  TempSrc:  Oral  Oral  SpO2: 96% 95% 96% 97%  Weight:      Height:        Wt Readings from Last 3 Encounters:  01/29/21 107.6 kg  01/31/2021 108.9 kg  01/09/21 114.8 kg     Intake/Output Summary (Last 24 hours) at 02/05/2021 1557 Last data filed at 02/05/2021 1200 Gross per 24 hour  Intake 480 ml  Output 1750 ml  Net -1270 ml     Physical Exam  Awake Alert, No new F.N deficits, Normal affect [place].AT,PERRAL Supple Neck,No JVD, No cervical lymphadenopathy appriciated.  Symmetrical Chest Kaylor movement, Good air movement bilaterally, few rales RRR,No Gallops, Rubs or new Murmurs, No Parasternal Heave +ve B.Sounds, Abd Soft, No tenderness, No organomegaly appriciated, No rebound - guarding or rigidity. No Cyanosis, Clubbing or edema, No new Rash or bruise    Data Review:   CBC Recent Labs  Lab 01/31/21 0330 02/01/21 0156 02/02/21 0028 02/04/21 0055 02/05/21 0225  WBC 3.8* 5.9 9.0 8.7 10.3  HGB 11.7* 12.2* 12.0* 12.3* 13.4  HCT 36.6* 37.6* 36.7* 36.1* 41.6  PLT 179 195 238 312 359  MCV 92.2 91.9 91.8 89.8 91.8  MCH 29.5 29.8 30.0 30.6 29.6  MCHC 32.0  32.4 32.7  34.1 32.2  RDW 13.1 13.1 13.0 13.1 13.2  LYMPHSABS 0.7 0.7 0.7 0.3* 0.4*  MONOABS 0.6 0.6 0.6 0.2 0.2  EOSABS 0.0 0.0 0.0 0.0 0.0  BASOSABS 0.0 0.0 0.0 0.0 0.0    Recent Labs  Lab 01/30/21 0141 01/30/21 0616 01/31/21 0330 01/31/21 0331 02/01/21 0156 02/02/21 0028 02/03/21 0723 02/04/21 0055 02/05/21 0225  NA 134*  --  135  --  135 134* 134* 133* 134*  K 4.8  --  4.9  --  4.5 4.4 4.4 4.7 5.1  CL 102  --  102  --  100 102 104 100 99  CO2 20*  --  26  --  25 23 22 23 25   GLUCOSE 126*  --  141*  --  135* 134* 111* 141* 128*  BUN 16  --  22  --  31* 31* 27* 28* 26*  CREATININE 1.16  --  1.44*  --  1.60* 1.50* 1.38* 1.46* 1.49*  CALCIUM 9.3  --  9.2  --  9.1 9.1 9.3 9.2 9.7  AST 60*  --  62*  --  53* 43*  --  41 49*  ALT 42  --  53*  --  54* 49*  --  45* 48*  ALKPHOS 52  --  49  --  49 51  --  54 61  BILITOT 0.6  --  0.8  --  1.1 0.7  --  0.7 1.1  ALBUMIN 3.4*  --  3.0*  --  3.1* 3.1*  --  3.2* 3.4*  MG 2.1  --  2.3  --  2.4 2.4  --  2.5* 2.4  CRP  --    < > 0.9  --  0.7 0.7  --  0.5 0.5  DDIMER  --    < > 0.96*  --  0.98* 1.02*  --  1.70* 2.67*  PROCALCITON <0.10  --  <0.10  --  <0.10 <0.10  --   --   --   BNP 38.4  --   --    < > 21.2 29.3 23.7 16.9 18.2   < > = values in this interval not displayed.    ------------------------------------------------------------------------------------------------------------------ No results for input(s): CHOL, HDL, LDLCALC, TRIG, CHOLHDL, LDLDIRECT in the last 72 hours.  Lab Results  Component Value Date   HGBA1C 6.0 (H) 01/29/2021   ------------------------------------------------------------------------------------------------------------------ No results for input(s): TSH, T4TOTAL, T3FREE, THYROIDAB in the last 72 hours.  Invalid input(s): FREET3  Cardiac Enzymes No results for input(s): CKMB, TROPONINI, MYOGLOBIN in the last 168 hours.  Invalid input(s):  CK ------------------------------------------------------------------------------------------------------------------    Component Value Date/Time   BNP 18.2 02/05/2021 0225    Micro Results Recent Results (from the past 240 hour(s))  Blood Culture (routine x 2)     Status: None   Collection Time: 01/21/2021 10:08 PM   Specimen: BLOOD  Result Value Ref Range Status   Specimen Description   Final    BLOOD Blood Culture results may not be optimal due to an excessive volume of blood received in culture bottles Performed at [facility], [address] [place], [place] [address]    Special Requests   Final    BOTTLES DRAWN AEROBIC AND ANAEROBIC LEFT ANTECUBITAL Performed at [facility], [address], [place], [place] [address]    Culture   Final    NO GROWTH 5 DAYS Performed at [facility], [place],  [place] [address]    Report Status 02/03/2021 FINAL  Final  Resp Panel by RT-PCR (Flu A&B, Covid) Nasopharyngeal Swab     Status: Abnormal   Collection Time: 02/21/2021 10:20 PM   Specimen: Nasopharyngeal Swab; Nasopharyngeal(NP) swabs in vial transport medium  Result Value Ref Range Status   SARS Coronavirus 2 by RT PCR POSITIVE (A) NEGATIVE Final    Comment: RESULT CALLED TO, READ BACK BY AND VERIFIED WITH: ALFRED DILLARD RN AT 9767 BY AV CALLED ON Feb 21, 2021 BY A VASSER (NOTE) SARS-CoV-2 target nucleic acids are DETECTED.  The SARS-CoV-2 RNA is generally detectable in upper respiratory specimens during the acute phase of infection. Positive results are indicative of the presence of the identified virus, but do not rule out bacterial infection or co-infection with other pathogens not detected by the test. Clinical correlation with patient history and other diagnostic information is necessary to determine patient infection status. The expected result is Negative.  Fact Sheet for  Patients: EntrepreneurPulse.com.au  Fact Sheet for Healthcare Providers: IncredibleEmployment.be  This test is not yet approved or cleared by the [place] FDA and  has been authorized for detection and/or diagnosis of SARS-CoV-2 by FDA under an Emergency Use Authorization (EUA).  This EUA will remain in ef fect (meaning this test can be used) for the duration of  the COVID-19 declaration under Section 564(b)(1) of the Act, 21 U.S.C. section 360bbb-3(b)(1), unless the authorization is terminated or revoked sooner.     Influenza A by PCR NEGATIVE NEGATIVE Final   Influenza B by PCR NEGATIVE NEGATIVE Final    Comment: (NOTE) The Xpert Xpress SARS-CoV-2/FLU/RSV plus assay is intended as an aid in the diagnosis of influenza from Nasopharyngeal swab specimens and should not be used as a sole basis for treatment. Nasal washings and aspirates are unacceptable for Xpert Xpress SARS-CoV-2/FLU/RSV testing.  Fact Sheet for Patients: EntrepreneurPulse.com.au  Fact Sheet for Healthcare Providers: IncredibleEmployment.be  This test is not yet approved or cleared by the [place] FDA and has been authorized for detection and/or diagnosis of SARS-CoV-2 by FDA under an Emergency Use Authorization (EUA). This EUA will remain in effect (meaning this test can be used) for the duration of the COVID-19 declaration under Section 564(b)(1) of the Act, 21 U.S.C. section 360bbb-3(b)(1), unless the authorization is terminated or revoked.  Performed at [facility], [address] [place], [place] [address]   Blood Culture (routine x 2)     Status: None   Collection Time: 2021-02-21 10:23 PM   Specimen: BLOOD  Result Value Ref Range Status   Specimen Description   Final    BLOOD Blood Culture adequate volume Performed at [facility], [address] [place], [place] [address]    Special Requests    Final    BOTTLES DRAWN AEROBIC AND ANAEROBIC RIGHT ANTECUBITAL Performed at [facility], [address] [place], [place] [address]    Culture   Final    NO GROWTH 5 DAYS Performed at [facility], [place] [address], [place], [place] [address]    Report Status 02/03/2021 FINAL  Final  MRSA PCR Screening     Status: None   Collection Time: 01/30/21  6:08 PM   Specimen: Nasal Mucosa; Nasopharyngeal  Result Value Ref Range Status   MRSA by PCR NEGATIVE NEGATIVE Final    Comment:        The GeneXpert MRSA Assay (FDA approved for NASAL specimens only), is  one component of a comprehensive MRSA colonization surveillance program. It is not intended to diagnose MRSA infection nor to guide or monitor treatment for MRSA infections. Performed at [facility], [place] [address] [place], [place] [address]     Radiology Reports DG Chest [place] 1 View  Result Date: 02/01/2021 CLINICAL DATA:  Shortness of breath.  COVID positive. EXAM: PORTABLE CHEST 1 VIEW COMPARISON:  01/31/2021 FINDINGS: Stable cardiomediastinal contours. Persistent low lung volumes. Mild diffuse increase interstitial markings noted bilaterally. More focal airspace disease is noted within the left lung base. The visualized osseous structures appear intact. IMPRESSION: 1. Stable low lung volumes and increased interstitial markings bilaterally. 2. More focal airspace disease within the left lung base, unchanged from previous exam. Electronically Signed   By: Kerby Moors M.D.   On: 02/01/2021 09:20   DG Chest Port 1 View  Result Date: 01/31/2021 CLINICAL DATA:  70 year old male with a history of shortness of breath and COVID EXAM: PORTABLE CHEST 1 VIEW COMPARISON:  01/29/2021, 01/13/2021 FINDINGS: Cardiomediastinal silhouette unchanged. Low lung volumes persist. Reticulonodular opacities of the lungs, with increasing airspace disease at the left lung base and development of air bronchograms. No pneumothorax. No  large pleural effusion. IMPRESSION: Increasing interstitial and airspace disease of the bilateral lungs particularly at the left lung base. Given the air bronchograms, lobar pneumonia/aspiration pneumonia also considered in addition to viral pneumonia. Electronically Signed   By: Corrie Mckusick D.O.   On: 01/31/2021 08:29   DG Chest Port 1 View  Result Date: 01/29/2021 CLINICAL DATA:  Shortness of breath, COVID-19 EXAM: PORTABLE CHEST 1 VIEW COMPARISON:  Portable exam 0820 hours compared to 01/22/2021 FINDINGS: Normal heart size, mediastinal contours, and pulmonary vascularity. Patchy infiltrates in mid to lower LEFT lung and minimally at RIGHT base consistent with multifocal pneumonia. RIGHT lung infiltrate is perhaps slightly improved since previous exam. No pleural effusion or pneumothorax. Bones unremarkable. IMPRESSION: BILATERAL pulmonary infiltrates consistent with multifocal pneumonia, perhaps minimally improved in RIGHT lung. Electronically Signed   By: Lavonia Dana M.D.   On: 01/29/2021 08:34   DG Chest Port 1 View  Result Date: 02/03/2021 CLINICAL DATA:  Cough.  Coronavirus infection. EXAM: PORTABLE CHEST 1 VIEW COMPARISON:  11/24/2017 FINDINGS: Heart size is normal. Chronic aortic atherosclerosis and tortuosity as seen previously. Hazy pulmonary infiltrates in the mid and lower lungs consistent with viral pneumonia. No dense consolidation. Chronic elevation of the left hemidiaphragm. No evidence of effusion. No acute bone finding. IMPRESSION: 1. Hazy pulmonary infiltrates in the mid and lower lungs consistent with viral pneumonia. No dense consolidation. 2. Aortic atherosclerosis. Electronically Signed   By: Nelson Chimes M.D.   On: 01/21/2021 22:21   VAS [place] LOWER EXTREMITY VENOUS (DVT)  Result Date: 01/31/2021  Lower Venous DVT Study Indications: Covid, d-dimer.  Anticoagulation: Lovenox. Comparison Study: No prior studies. Performing Technologist: Darlin Coco RDMS  Examination Guidelines: A  complete evaluation includes B-mode imaging, spectral Doppler, color Doppler, and power Doppler as needed of all accessible portions of each vessel. Bilateral testing is considered an integral part of a complete examination. Limited examinations for reoccurring indications may be performed as noted. The reflux portion of the exam is performed with the patient in reverse Trendelenburg.  +---------+---------------+---------+-----------+----------+-------------------+ RIGHT    CompressibilityPhasicitySpontaneityPropertiesThrombus Aging      +---------+---------------+---------+-----------+----------+-------------------+ CFV      Full           Yes      Yes                                      +---------+---------------+---------+-----------+----------+-------------------+  SFJ      Full                                                             +---------+---------------+---------+-----------+----------+-------------------+ FV Prox  Full                                                             +---------+---------------+---------+-----------+----------+-------------------+ FV Mid   Full                                                             +---------+---------------+---------+-----------+----------+-------------------+ FV DistalFull                                                             +---------+---------------+---------+-----------+----------+-------------------+ PFV      Full                                                             +---------+---------------+---------+-----------+----------+-------------------+ POP      Full           No       Yes                  Rouleaux flow noted +---------+---------------+---------+-----------+----------+-------------------+ PTV      Full                                                             +---------+---------------+---------+-----------+----------+-------------------+ PERO     Full                                                              +---------+---------------+---------+-----------+----------+-------------------+   +---------+---------------+---------+-----------+----------+-------------------+ LEFT     CompressibilityPhasicitySpontaneityPropertiesThrombus Aging      +---------+---------------+---------+-----------+----------+-------------------+ CFV      Full           Yes      Yes                                      +---------+---------------+---------+-----------+----------+-------------------+ SFJ      Full                                                             +---------+---------------+---------+-----------+----------+-------------------+  FV Prox  Full                                                             +---------+---------------+---------+-----------+----------+-------------------+ FV Mid   Full                                                             +---------+---------------+---------+-----------+----------+-------------------+ FV DistalFull                                                             +---------+---------------+---------+-----------+----------+-------------------+ PFV      Full                                                             +---------+---------------+---------+-----------+----------+-------------------+ POP      Full           No       Yes                  Rouleaux flow noted +---------+---------------+---------+-----------+----------+-------------------+ PTV      Full                                                             +---------+---------------+---------+-----------+----------+-------------------+ PERO     Full                                                             +---------+---------------+---------+-----------+----------+-------------------+     Summary: RIGHT: - There is no evidence of deep vein thrombosis in the lower extremity.  - No cystic  structure found in the popliteal fossa.  LEFT: - There is no evidence of deep vein thrombosis in the lower extremity.  - No cystic structure found in the popliteal fossa.  *See table(s) above for measurements and observations. Electronically signed by Jamelle Haring on 01/31/2021 at 10:53:05 AM.    Final    ECHOCARDIOGRAM LIMITED  Result Date: 01/30/2021    ECHOCARDIOGRAM LIMITED REPORT   Patient Name:   Kurt Williams Date of Exam: 01/30/2021 Medical Rec #:  [id number]    Height:       72.0 in Accession #:    [id number]   Weight:       237.2 lb Date of Birth:  1951/06/15    BSA:  2.291 m Patient Age:    21 years     BP:           103/65 mmHg Patient Gender: M            HR:           52 bpm. Exam Location:  Inpatient Procedure: Limited Echo, Cardiac Doppler and Color Doppler Indications:    CHF-Acute diastolic  History:        Patient has no prior history of Echocardiogram examinations.                 CAD; Risk Factors:Sleep Apnea. CKD. GERD.  Sonographer:    Clayton Lefort RDCS (AE) Referring Phys: [id number]intosh [place]  1. Left ventricular ejection fraction, by estimation, is 60 to 65%. The left ventricle has normal function. The left ventricle has no regional Butters motion abnormalities. There is mild left ventricular hypertrophy. Left ventricular diastolic parameters are consistent with Grade I diastolic dysfunction (impaired relaxation).  2. Right ventricular systolic function is normal. The right ventricular size is normal.  3. The mitral valve is normal in structure. No evidence of mitral valve regurgitation. No evidence of mitral stenosis.  4. The aortic valve is normal in structure. Aortic valve regurgitation is not visualized. No aortic stenosis is present.  5. The inferior vena cava is normal in size with greater than 50% respiratory variability, suggesting right atrial pressure of 3 mmHg. FINDINGS  Left Ventricle: Left ventricular ejection fraction, by estimation, is 60 to 65%. The left  ventricle has normal function. The left ventricle has no regional Bradeen motion abnormalities. The left ventricular internal cavity size was normal in size. There is  mild left ventricular hypertrophy. Left ventricular diastolic parameters are consistent with Grade I diastolic dysfunction (impaired relaxation). Right Ventricle: The right ventricular size is normal. No increase in right ventricular Beckerman thickness. Right ventricular systolic function is normal. Left Atrium: Left atrial size was normal in size. Right Atrium: Right atrial size was normal in size. Pericardium: There is no evidence of pericardial effusion. Mitral Valve: The mitral valve is normal in structure. No evidence of mitral valve stenosis. Tricuspid Valve: The tricuspid valve is normal in structure. Tricuspid valve regurgitation is not demonstrated. No evidence of tricuspid stenosis. Aortic Valve: The aortic valve is normal in structure. Aortic valve regurgitation is not visualized. No aortic stenosis is present. Aortic valve mean gradient measures 5.0 mmHg. Aortic valve peak gradient measures 7.1 mmHg. Aortic valve area, by VTI measures 2.23 cm. Pulmonic Valve: The pulmonic valve was normal in structure. Pulmonic valve regurgitation is not visualized. No evidence of pulmonic stenosis. Aorta: The aortic root is normal in size and structure. Venous: The inferior vena cava is normal in size with greater than 50% respiratory variability, suggesting right atrial pressure of 3 mmHg. IAS/Shunts: No atrial level shunt detected by color flow Doppler. LEFT VENTRICLE PLAX 2D LVIDd:         4.30 cm  Diastology LVIDs:         2.80 cm  LV e' medial:    5.22 cm/s LV PW:         1.40 cm  LV E/e' medial:  10.7 LV IVS:        1.20 cm  LV e' lateral:   6.85 cm/s LVOT diam:     2.00 cm  LV E/e' lateral: 8.2 LV SV:         69 LV SV Index:   30 LVOT  Area:     3.14 cm  IVC IVC diam: 1.80 cm LEFT ATRIUM         Index LA diam:    3.20 cm 1.40 cm/m  AORTIC VALVE AV Area  (Vmax):    2.65 cm AV Area (Vmean):   2.20 cm AV Area (VTI):     2.23 cm AV Vmax:           133.00 cm/s AV Vmean:          103.000 cm/s AV VTI:            0.310 m AV Peak Grad:      7.1 mmHg AV Mean Grad:      5.0 mmHg LVOT Vmax:         112.00 cm/s LVOT Vmean:        72.000 cm/s LVOT VTI:          0.220 m LVOT/AV VTI ratio: 0.71  AORTA Ao Root diam: 3.70 cm Ao Asc diam:  3.30 cm MITRAL VALVE MV Area (PHT): 2.44 cm    SHUNTS MV Decel Time: 311 msec    Systemic VTI:  0.22 m MV E velocity: 56.10 cm/s  Systemic Diam: 2.00 cm MV A velocity: 50.60 cm/s MV E/A ratio:  1.11 Candee Furbish MD Electronically signed by Candee Furbish MD Signature Date/Time: 01/30/2021/3:26:43 PM    Final

## 2021-02-05 NOTE — Progress Notes (Signed)
RT NOTE:  Pt unable to wear CPAP at this time due to increased O2 needs. Currently on HHFNC 30L/80%.

## 2021-02-05 NOTE — Plan of Care (Signed)
  Problem: Health Behavior/Discharge Planning: Goal: Ability to manage health-related needs will improve Outcome: Progressing   Problem: Clinical Measurements: Goal: Diagnostic test results will improve Outcome: Progressing Goal: Respiratory complications will improve Outcome: Progressing   Problem: Activity: Goal: Risk for activity intolerance will decrease Outcome: Progressing   Problem: Coping: Goal: Level of anxiety will decrease Outcome: Progressing   Problem: Pain Managment: Goal: General experience of comfort will improve Outcome: Progressing   Problem: Safety: Goal: Ability to remain free from injury will improve Outcome: Progressing   Problem: Education: Goal: Knowledge of risk factors and measures for prevention of condition will improve Outcome: Progressing   Problem: Respiratory: Goal: Will maintain a patent airway Outcome: Progressing Goal: Complications related to the disease process, condition or treatment will be avoided or minimized Outcome: Progressing

## 2021-02-06 DIAGNOSIS — U071 COVID-19: Secondary | ICD-10-CM | POA: Diagnosis not present

## 2021-02-06 DIAGNOSIS — J9601 Acute respiratory failure with hypoxia: Secondary | ICD-10-CM | POA: Diagnosis not present

## 2021-02-06 LAB — CBC WITH DIFFERENTIAL/PLATELET
Abs Immature Granulocytes: 0.12 10*3/uL — ABNORMAL HIGH (ref 0.00–0.07)
Basophils Absolute: 0 10*3/uL (ref 0.0–0.1)
Basophils Relative: 0 %
Eosinophils Absolute: 0.1 10*3/uL (ref 0.0–0.5)
Eosinophils Relative: 1 %
HCT: 37.8 % — ABNORMAL LOW (ref 39.0–52.0)
Hemoglobin: 12.9 g/dL — ABNORMAL LOW (ref 13.0–17.0)
Immature Granulocytes: 1 %
Lymphocytes Relative: 3 %
Lymphs Abs: 0.3 10*3/uL — ABNORMAL LOW (ref 0.7–4.0)
MCH: 30.8 pg (ref 26.0–34.0)
MCHC: 34.1 g/dL (ref 30.0–36.0)
MCV: 90.2 fL (ref 80.0–100.0)
Monocytes Absolute: 0.2 10*3/uL (ref 0.1–1.0)
Monocytes Relative: 2 %
Neutro Abs: 9.5 10*3/uL — ABNORMAL HIGH (ref 1.7–7.7)
Neutrophils Relative %: 93 %
Platelets: 322 10*3/uL (ref 150–400)
RBC: 4.19 MIL/uL — ABNORMAL LOW (ref 4.22–5.81)
RDW: 13.2 % (ref 11.5–15.5)
WBC: 10.1 10*3/uL (ref 4.0–10.5)
nRBC: 0 % (ref 0.0–0.2)

## 2021-02-06 LAB — CBC
HCT: 42.2 % (ref 39.0–52.0)
Hemoglobin: 13.6 g/dL (ref 13.0–17.0)
MCH: 29.4 pg (ref 26.0–34.0)
MCHC: 32.2 g/dL (ref 30.0–36.0)
MCV: 91.3 fL (ref 80.0–100.0)
Platelets: 308 10*3/uL (ref 150–400)
RBC: 4.62 MIL/uL (ref 4.22–5.81)
RDW: 13.4 % (ref 11.5–15.5)
WBC: 11.7 10*3/uL — ABNORMAL HIGH (ref 4.0–10.5)
nRBC: 0 % (ref 0.0–0.2)

## 2021-02-06 LAB — COMPREHENSIVE METABOLIC PANEL
ALT: 44 U/L (ref 0–44)
ALT: 45 U/L — ABNORMAL HIGH (ref 0–44)
AST: 47 U/L — ABNORMAL HIGH (ref 15–41)
AST: 50 U/L — ABNORMAL HIGH (ref 15–41)
Albumin: 3.3 g/dL — ABNORMAL LOW (ref 3.5–5.0)
Albumin: 3.5 g/dL (ref 3.5–5.0)
Alkaline Phosphatase: 60 U/L (ref 38–126)
Alkaline Phosphatase: 62 U/L (ref 38–126)
Anion gap: 9 (ref 5–15)
Anion gap: 11 (ref 5–15)
BUN: 29 mg/dL — ABNORMAL HIGH (ref 8–23)
BUN: 29 mg/dL — ABNORMAL HIGH (ref 8–23)
CO2: 23 mmol/L (ref 22–32)
CO2: 23 mmol/L (ref 22–32)
Calcium: 9.6 mg/dL (ref 8.9–10.3)
Calcium: 9.9 mg/dL (ref 8.9–10.3)
Chloride: 100 mmol/L (ref 98–111)
Chloride: 99 mmol/L (ref 98–111)
Creatinine, Ser: 1.56 mg/dL — ABNORMAL HIGH (ref 0.61–1.24)
Creatinine, Ser: 1.49 mg/dL — ABNORMAL HIGH (ref 0.61–1.24)
GFR, Estimated: 47 mL/min — ABNORMAL LOW (ref >60)
GFR, Estimated: 50 mL/min — ABNORMAL LOW (ref >60)
Glucose, Bld: 119 mg/dL — ABNORMAL HIGH (ref 70–99)
Glucose, Bld: 105 mg/dL — ABNORMAL HIGH (ref 70–99)
Potassium: 5.1 mmol/L (ref 3.5–5.1)
Potassium: 4.9 mmol/L (ref 3.5–5.1)
Sodium: 132 mmol/L — ABNORMAL LOW (ref 135–145)
Sodium: 133 mmol/L — ABNORMAL LOW (ref 135–145)
Total Bilirubin: 1 mg/dL (ref 0.3–1.2)
Total Bilirubin: 1.1 mg/dL (ref 0.3–1.2)
Total Protein: 6.7 g/dL (ref 6.5–8.1)
Total Protein: 7 g/dL (ref 6.5–8.1)

## 2021-02-06 LAB — GLUCOSE, CAPILLARY
Glucose-Capillary: 114 mg/dL — ABNORMAL HIGH (ref 70–99)
Glucose-Capillary: 107 mg/dL — ABNORMAL HIGH (ref 70–99)
Glucose-Capillary: 129 mg/dL — ABNORMAL HIGH (ref 70–99)
Glucose-Capillary: 117 mg/dL — ABNORMAL HIGH (ref 70–99)

## 2021-02-06 NOTE — Progress Notes (Signed)
0930 assisted patient with transfer to chair from bed. Patient desat to 69-70%. This nurse encouraged patient to relax and take slow deep breaths in through the nose and out through the mouth. Patient was anxious about shortness of breath and not following commands, not able to recover quickly. Applied NRB mask at 15L. Patient  Became more alert after 1-2 minutes of assistance from non rebreather. Patient able to fully recover within 5 minutes, and able to follow commands. Although patient does not recall what happened.

## 2021-02-06 NOTE — Progress Notes (Signed)
Occupational Therapy Treatment Patient Details Name: Kurt Williams MRN: [id number] DOB: 1951-02-26 Today's Date: 02/06/2021    History of present illness Pt is a 70 y.o. male admitted 01/26/2021 with SOB and cough; workup for acute hypoxic respiratory failure due to COVID-19 pneumonitis; requiring heated HFNC. No evidence of DVT. PMH includes CAD, OSA on CPAP.   OT comments  Pt more fatigued today.  He complained of dizziness upon standing with Sp02 decreasing to 84% with pt on 30L 90% Fi02.  NRB added for second stand and he was able to tolerate x 2 mins before fatiguing, but no c/o dizziness and Sp02 low 90s.  BP was stable.  He was able to perform seated theraband exercises.   Will continue to follow.   Follow Up Recommendations  LTACH;No OT follow up    Equipment Recommendations  None recommended by OT    Recommendations for Other Services      Precautions / Restrictions Precautions Precautions: Fall;Other (comment) Precaution Comments: Watch SpO2 on HHFNC       Mobility Bed Mobility               General bed mobility comments: pt sitting in recliner    Transfers Overall transfer level: Needs assistance   Transfers: Sit to/from Stand Sit to Stand: Min guard         General transfer comment: pt reports dizziness upon initial stand with Sp02 decreasing to 84% upon standing - pt on 30L 02, 90% Fi02 via [place].  he returned to sitting.  A NRB was added, and pt able to stand for 2 mins before fatiguing and having to return to sitting    Balance Overall balance assessment: Needs assistance         Standing balance support: No upper extremity supported Standing balance-Leahy Scale: Fair Standing balance comment: pt reports intermittent dizziness                           ADL either performed or assessed with clinical judgement   ADL                           Toilet Transfer: Stand-pivot;Min guard             General ADL Comments: pt  with increased fatigue     Vision       Perception     Praxis      Cognition Arousal/Alertness: Awake/alert Behavior During Therapy: WFL for tasks assessed/performed;Flat affect Overall Cognitive Status: Within Functional Limits for tasks assessed                                          Exercises Other Exercises Other Exercises: Pt performed 2 sets 10 reps overhead shoulder horizontal abduction with Sp02 88%   Shoulder Instructions       General Comments BP seated 104/63, and standing 110/70.    Pertinent Vitals/ Pain       Pain Assessment: No/denies pain  Home Living                                          Prior Functioning/Environment  Frequency  Min 2X/week        Progress Toward Goals  OT Goals(current goals can now be found in the care plan section)  Progress towards OT goals: Not progressing toward goals - comment (pt fatigued this date and with decreased activity tolerance)     Plan Discharge plan remains appropriate    Co-evaluation                 AM-PAC OT "6 Clicks" Daily Activity     Outcome Measure   Help from another person eating meals?: None Help from another person taking care of personal grooming?: A Little Help from another person toileting, which includes using toliet, bedpan, or urinal?: A Little Help from another person bathing (including washing, rinsing, drying)?: A Little Help from another person to put on and taking off regular upper body clothing?: A Little Help from another person to put on and taking off regular lower body clothing?: A Little 6 Click Score: 19    End of Session Equipment Utilized During Treatment: Oxygen  OT Visit Diagnosis: Other (comment)   Activity Tolerance Patient limited by fatigue   Patient Left in chair;with call bell/phone within reach   Nurse Communication Mobility status        Time: 5638-9373 OT Time Calculation (min): 34  min  Charges: OT General Charges $OT Visit: 1 Visit OT Treatments $Therapeutic Activity: 8-22 mins $Therapeutic Exercise: 8-22 mins  [name]., OTR/L Acute Rehabilitation Services Pager [phone] Office [phone]    [name] 02/06/2021, 4:46 PM

## 2021-02-06 NOTE — TOC Progression Note (Signed)
Transition of Care [facility]) - Progression Note    Patient Details  Name: Kurt Williams MRN: [id number] Date of Birth: 1951/03/01  Transition of Care [facility]) CM/SW [place], LCSW Phone Number: 02/06/2021, 5:07 PM  Clinical Narrative:    CSW received call from patient's daughter requesting information about LTACHs. CSW provided info on the two [facility] facilities [facility]) and explained needed medical criteria and insurance approval process. She reported preference for [facility] since they are located in the hospital if she has a choice. If patient progresses to SNF level, family prefers [facility]. CSW will continue to follow.     Barriers to Discharge: Continued Medical Work up  Expected Discharge Plan and Services   In-house Referral: Clinical Social Work     Living arrangements for the past 2 months: Single Family Home                                       Social Determinants of Health (SDOH) Interventions    Readmission Risk Interventions No flowsheet data found.

## 2021-02-06 NOTE — Progress Notes (Signed)
PROGRESS NOTE                                                                                                                                                                                                             Patient Demographics:    Kurt Williams, is a 70 y.o. male, DOB - 08/09/1951, IOM:[id number]  Outpatient Primary MD for the patient is Shelda Pal, DO    LOS - 8  Admit date - 01/29/2021    Chief Complaint  Patient presents with  . Covid Positive       Brief Narrative (HPI from H&P) - Kurt Williams is a 70 y.o. male with medical history significant of CAD, OSA on CPAP. Pt presents to the ED with c/o SOB and cough ongoing for 5-6 days, he is unvaccinated, he was diagnosed with severe hypoxic Resp failure due to Covid PNA.   Subjective:   No acute issues or events overnight; continues to become somewhat dyspneic even with minimal exertion but denies nausea vomiting diarrhea constipation headache fevers chills chest pain.   Assessment  & Plan :     Acute Hypoxic Resp. Failure due to Acute Covid 19 Viral pneumonia ARDS resolving - Unvaccinated with prolonged illness prior to admission - Moderate to severe disease given imaging and symptoms - Continues to improve minimally, likely prolonged hypoxic phase given age and delayed care - Completed Remdesivir/Actemra - Continues on moderate dose Lovenox overall remains extremely tenuous - markedly dyspneic with even minimal exertion - Continue incentive spirometry, flutter, proning, early ambulation  - Continue to wean oxygen aggressively, goal sats 88 to 92% SpO2: 92 % O2 Flow Rate (L/min): 30 L/min FiO2 (%): 90 %  Elevated D-dimer due to COVID-19 and profound inflammation - Continues on moderate dose Lovenox, negative leg ultrasound.  CAD - Chest pain-free currently on aspirin and statin for secondary prevention.  Stable echocardiogram EF 60%, no  WMA. EKG Nonacute.  AKI on questionable CKD3A - Approaching baseline - Baseline creatinine around 1.4. resolved after IVF Lab Results  Component Value Date   CREATININE 1.49 (H) 02/06/2021   CREATININE 1.56 (H) 02/06/2021   CREATININE 1.49 (H) 02/05/2021   OSA  - CPAP nightly - ordered, if he can tolerate with max Fio2.      Condition - Guarded Family Communication  : Daughter/Wife updated  at length over the phone Code Status :  Full Consults  :  None PUD Prophylaxis : PPI   Procedures  :     Echocardiogram - 1. Left ventricular ejection fraction, by estimation, is 60 to 65%. The left ventricle has normal function. The left ventricle has no regional Tant motion abnormalities. There is mild left ventricular hypertrophy. Left ventricular diastolic parameters are consistent with Grade I diastolic dysfunction (impaired relaxation). 2. Right ventricular systolic function is normal. The right ventricular size is normal. 3. The mitral valve is normal in structure. No evidence of mitral valve regurgitation. No evidence of mitral stenosis. 4. The aortic valve is normal in structure. Aortic valve regurgitation is not visualized. No aortic stenosis is present.  5. The inferior vena cava is normal in size with greater than 50% respiratory variability, suggesting right atrial pressure of 3 mmHg.  Leg ultrasound - No DVT      Disposition Plan:  Status is: Inpatient  Remains inpatient appropriate because: IV treatments appropriate due to intensity of illness or inability to take PO  Dispo: The patient is from: Home             Anticipated d/c is to: LTAC             Anticipated d/c date is: 2 days             Patient currently is medically stable to d/c.  Difficult to place patient No  DVT Prophylaxis : Lovenox Lab Results  Component Value Date   PLT 308 02/06/2021   Diet :  Diet Order            Diet Heart Room service appropriate? Yes; Fluid consistency: Thin  Diet effective now                 Inpatient Medications Scheduled Meds: . aspirin  81 mg Oral Daily  . enoxaparin (LOVENOX) injection  55 mg Subcutaneous Q24H  . insulin aspart  0-9 Units Subcutaneous TID AC & HS  . melatonin  5 mg Oral QHS  . methylPREDNISolone (SOLU-MEDROL) injection  40 mg Intravenous Q12H  . pantoprazole  40 mg Oral Daily  . rosuvastatin  5 mg Oral Daily  . tamsulosin  0.4 mg Oral Daily   Continuous Infusions:  PRN Meds:.acetaminophen, nitroGLYCERIN, [DISCONTINUED] ondansetron **OR** ondansetron (ZOFRAN) IV  Antibiotics : Anti-infectives (From admission, onward)   Start     Dose/Rate Route Frequency Ordered Stop   01/30/21 1800  remdesivir 100 mg in sodium chloride 0.9 % 100 mL IVPB  Status:  Discontinued       "Followed by" Linked Group Details   100 mg 200 mL/hr over 30 Minutes Intravenous Daily 02/08/2021 2311 01/29/21 1041   01/29/21 1600  remdesivir 100 mg in sodium chloride 0.9 % 100 mL IVPB       "Followed by" Linked Group Details   100 mg 200 mL/hr over 30 Minutes Intravenous Daily 01/29/21 1041 02/01/21 1001   02/06/2021 2339  remdesivir 100 mg in sodium chloride 0.9 % 100 mL IVPB       "Followed by" Linked Group Details   100 mg 200 mL/hr over 30 Minutes Intravenous  Once 02/03/2021 2311 01/29/21 0028   01/22/2021 2315  remdesivir 100 mg in sodium chloride 0.9 % 100 mL IVPB       "Followed by" Linked Group Details   100 mg 200 mL/hr over 30 Minutes Intravenous  Once 01/22/2021 2311 02/04/2021 2353  Time Spent in minutes  [place] DO 02/06/2021 at 2:37 PM  Pager: Secure Chat [facility] -  Office  [phone] See all Orders from today for further details  Objective:   Vitals:   02/06/21 0921 02/06/21 0930 02/06/21 0935 02/06/21 1132  BP:    118/66  Pulse:    78  Resp:    16  Temp:    98.4 F (36.9 C)  TempSrc:    Oral  SpO2: 92% (!) 70% 92% 92%  Weight:      Height:       Wt Readings from Last 3 Encounters:  01/29/21 107.6 kg   01/26/2021 108.9 kg  01/09/21 114.8 kg    Intake/Output Summary (Last 24 hours) at 02/06/2021 1437 Last data filed at 02/06/2021 0856 Gross per 24 hour  Intake 480 ml  Output 800 ml  Net -320 ml   Physical Exam Awake Alert, No new F.N deficits, Normal affect [place].AT,PERRAL Supple Neck,No JVD, No cervical lymphadenopathy appriciated.  Symmetrical Chest Been movement, good air movement bilaterally, few rales RRR,No Gallops, Rubs or new Murmurs +ve B.Sounds, Abd Soft, No tenderness, No organomegaly appriciated, No rebound - guarding or rigidity. No Cyanosis, Clubbing or edema, No new Rash or bruise   Data Review:   CBC Recent Labs  Lab 02/01/21 0156 02/02/21 0028 02/04/21 0055 02/05/21 0225 02/06/21 0206 02/06/21 0830  WBC 5.9 9.0 8.7 10.3 10.1 11.7*  HGB 12.2* 12.0* 12.3* 13.4 12.9* 13.6  HCT 37.6* 36.7* 36.1* 41.6 37.8* 42.2  PLT 195 238 312 359 322 308  MCV 91.9 91.8 89.8 91.8 90.2 91.3  MCH 29.8 30.0 30.6 29.6 30.8 29.4  MCHC 32.4 32.7 34.1 32.2 34.1 32.2  RDW 13.1 13.0 13.1 13.2 13.2 13.4  LYMPHSABS 0.7 0.7 0.3* 0.4* 0.3*  --   MONOABS 0.6 0.6 0.2 0.2 0.2  --   EOSABS 0.0 0.0 0.0 0.0 0.1  --   BASOSABS 0.0 0.0 0.0 0.0 0.0  --     Recent Labs  Lab 01/31/21 0330 01/31/21 0331 02/01/21 0156 02/02/21 0028 02/03/21 0723 02/04/21 0055 02/05/21 0225 02/06/21 0206 02/06/21 0830  NA 135  --  135 134* 134* 133* 134* 132* 133*  K 4.9  --  4.5 4.4 4.4 4.7 5.1 5.1 4.9  CL 102  --  100 102 104 100 99 100 99  CO2 26  --  25 23 22 23 25 23 23   GLUCOSE 141*  --  135* 134* 111* 141* 128* 119* 105*  BUN 22  --  31* 31* 27* 28* 26* 29* 29*  CREATININE 1.44*  --  1.60* 1.50* 1.38* 1.46* 1.49* 1.56* 1.49*  CALCIUM 9.2  --  9.1 9.1 9.3 9.2 9.7 9.6 9.9  AST 62*  --  53* 43*  --  41 49* 47* 50*  ALT 53*  --  54* 49*  --  45* 48* 44 45*  ALKPHOS 49  --  49 51  --  54 61 60 62  BILITOT 0.8  --  1.1 0.7  --  0.7 1.1 1.0 1.1  ALBUMIN 3.0*  --  3.1* 3.1*  --  3.2* 3.4* 3.3* 3.5   MG 2.3  --  2.4 2.4  --  2.5* 2.4  --   --   CRP 0.9  --  0.7 0.7  --  0.5 0.5  --   --   DDIMER 0.96*  --  0.98* 1.02*  --  1.70* 2.67*  --   --  PROCALCITON <0.10  --  <0.10 <0.10  --   --   --   --   --   BNP  --    < > 21.2 29.3 23.7 16.9 18.2  --   --    < > = values in this interval not displayed.    ------------------------------------------------------------------------------------------------------------------ No results for input(s): CHOL, HDL, LDLCALC, TRIG, CHOLHDL, LDLDIRECT in the last 72 hours.  Lab Results  Component Value Date   HGBA1C 6.0 (H) 01/29/2021   ------------------------------------------------------------------------------------------------------------------ No results for input(s): TSH, T4TOTAL, T3FREE, THYROIDAB in the last 72 hours.  Invalid input(s): FREET3  Cardiac Enzymes No results for input(s): CKMB, TROPONINI, MYOGLOBIN in the last 168 hours.  Invalid input(s): CK ------------------------------------------------------------------------------------------------------------------    Component Value Date/Time   BNP 18.2 02/05/2021 0225    Micro Results Recent Results (from the past 240 hour(s))  Blood Culture (routine x 2)     Status: None   Collection Time: 01/30/2021 10:08 PM   Specimen: BLOOD  Result Value Ref Range Status   Specimen Description   Final    BLOOD Blood Culture results may not be optimal due to an excessive volume of blood received in culture bottles Performed at [facility], [address] [place], [place] [address]    Special Requests   Final    BOTTLES DRAWN AEROBIC AND ANAEROBIC LEFT ANTECUBITAL Performed at [facility], [address], [place], [place] [address]    Culture   Final    NO GROWTH 5 DAYS Performed at [facility], [place] [address] [place], [place] [address]    Report Status 02/03/2021 FINAL  Final  Resp Panel by RT-PCR (Flu A&B, Covid) Nasopharyngeal Swab     Status:  Abnormal   Collection Time: 01/17/2021 10:20 PM   Specimen: Nasopharyngeal Swab; Nasopharyngeal(NP) swabs in vial transport medium  Result Value Ref Range Status   SARS Coronavirus 2 by RT PCR POSITIVE (A) NEGATIVE Final    Comment: RESULT CALLED TO, READ BACK BY AND VERIFIED WITH: ALFRED DILLARD RN AT 0092 BY AV CALLED ON 02/05/2021 BY A VASSER (NOTE) SARS-CoV-2 target nucleic acids are DETECTED.  The SARS-CoV-2 RNA is generally detectable in upper respiratory specimens during the acute phase of infection. Positive results are indicative of the presence of the identified virus, but do not rule out bacterial infection or co-infection with other pathogens not detected by the test. Clinical correlation with patient history and other diagnostic information is necessary to determine patient infection status. The expected result is Negative.  Fact Sheet for Patients: EntrepreneurPulse.com.au  Fact Sheet for Healthcare Providers: IncredibleEmployment.be  This test is not yet approved or cleared by the [place] FDA and  has been authorized for detection and/or diagnosis of SARS-CoV-2 by FDA under an Emergency Use Authorization (EUA).  This EUA will remain in ef fect (meaning this test can be used) for the duration of  the COVID-19 declaration under Section 564(b)(1) of the Act, 21 U.S.C. section 360bbb-3(b)(1), unless the authorization is terminated or revoked sooner.     Influenza A by PCR NEGATIVE NEGATIVE Final   Influenza B by PCR NEGATIVE NEGATIVE Final    Comment: (NOTE) The Xpert Xpress SARS-CoV-2/FLU/RSV plus assay is intended as an aid in the diagnosis of influenza from Nasopharyngeal swab specimens and should not be used as a sole basis for treatment. Nasal washings and aspirates are unacceptable for Xpert Xpress SARS-CoV-2/FLU/RSV testing.  Fact Sheet for Patients: EntrepreneurPulse.com.au  Fact Sheet for  Healthcare Providers: IncredibleEmployment.be  This test is not yet approved or cleared by the [place] FDA and has been authorized for detection and/or diagnosis of SARS-CoV-2 by FDA under an Emergency Use Authorization (EUA). This EUA will remain in effect (meaning this test can be used) for the duration of the COVID-19 declaration under Section 564(b)(1) of the Act, 21 U.S.C. section 360bbb-3(b)(1), unless the authorization is terminated or revoked.  Performed at [facility], [address] [place], [place] [address]   Blood Culture (routine x 2)     Status: None   Collection Time: 2021-02-05 10:23 PM   Specimen: BLOOD  Result Value Ref Range Status   Specimen Description   Final    BLOOD Blood Culture adequate volume Performed at [facility], [address] [place], [place] [address]    Special Requests   Final    BOTTLES DRAWN AEROBIC AND ANAEROBIC RIGHT ANTECUBITAL Performed at [facility], [address] [place], [place] [address]    Culture   Final    NO GROWTH 5 DAYS Performed at [facility], [place] [address], [place], [place] [address]    Report Status 02/03/2021 FINAL  Final  MRSA PCR Screening     Status: None   Collection Time: 01/30/21  6:08 PM   Specimen: Nasal Mucosa; Nasopharyngeal  Result Value Ref Range Status   MRSA by PCR NEGATIVE NEGATIVE Final    Comment:        The GeneXpert MRSA Assay (FDA approved for NASAL specimens only), is one component of a comprehensive MRSA colonization surveillance program. It is not intended to diagnose MRSA infection nor to guide or monitor treatment for MRSA infections. Performed at [facility], [place] [address] [place], [place] [address]     Radiology Reports DG Chest [place] 1 View  Result Date: 02/01/2021 CLINICAL DATA:  Shortness of breath.  COVID positive. EXAM: PORTABLE CHEST 1 VIEW COMPARISON:  01/31/2021 FINDINGS: Stable  cardiomediastinal contours. Persistent low lung volumes. Mild diffuse increase interstitial markings noted bilaterally. More focal airspace disease is noted within the left lung base. The visualized osseous structures appear intact. IMPRESSION: 1. Stable low lung volumes and increased interstitial markings bilaterally. 2. More focal airspace disease within the left lung base, unchanged from previous exam. Electronically Signed   By: Kerby Moors M.D.   On: 02/01/2021 09:20   DG Chest Port 1 View  Result Date: 01/31/2021 CLINICAL DATA:  70 year old male with a history of shortness of breath and COVID EXAM: PORTABLE CHEST 1 VIEW COMPARISON:  01/29/2021, 05-Feb-2021 FINDINGS: Cardiomediastinal silhouette unchanged. Low lung volumes persist. Reticulonodular opacities of the lungs, with increasing airspace disease at the left lung base and development of air bronchograms. No pneumothorax. No large pleural effusion. IMPRESSION: Increasing interstitial and airspace disease of the bilateral lungs particularly at the left lung base. Given the air bronchograms, lobar pneumonia/aspiration pneumonia also considered in addition to viral pneumonia. Electronically Signed   By: Corrie Mckusick D.O.   On: 01/31/2021 08:29   DG Chest Port 1 View  Result Date: 01/29/2021 CLINICAL DATA:  Shortness of breath, COVID-19 EXAM: PORTABLE CHEST 1 VIEW COMPARISON:  Portable exam 0820 hours compared to 02-05-2021 FINDINGS: Normal heart size, mediastinal contours, and pulmonary vascularity. Patchy infiltrates in mid to lower LEFT lung and minimally at RIGHT base consistent with multifocal pneumonia. RIGHT lung infiltrate is perhaps slightly improved since previous exam. No  pleural effusion or pneumothorax. Bones unremarkable. IMPRESSION: BILATERAL pulmonary infiltrates consistent with multifocal pneumonia, perhaps minimally improved in RIGHT lung. Electronically Signed   By: Lavonia Dana M.D.   On: 01/29/2021 08:34   DG Chest Port 1  View  Result Date: 02/07/2021 CLINICAL DATA:  Cough.  Coronavirus infection. EXAM: PORTABLE CHEST 1 VIEW COMPARISON:  11/24/2017 FINDINGS: Heart size is normal. Chronic aortic atherosclerosis and tortuosity as seen previously. Hazy pulmonary infiltrates in the mid and lower lungs consistent with viral pneumonia. No dense consolidation. Chronic elevation of the left hemidiaphragm. No evidence of effusion. No acute bone finding. IMPRESSION: 1. Hazy pulmonary infiltrates in the mid and lower lungs consistent with viral pneumonia. No dense consolidation. 2. Aortic atherosclerosis. Electronically Signed   By: Nelson Chimes M.D.   On: 01/16/2021 22:21   VAS [place] LOWER EXTREMITY VENOUS (DVT)  Result Date: 01/31/2021  Lower Venous DVT Study Indications: Covid, d-dimer.  Anticoagulation: Lovenox. Comparison Study: No prior studies. Performing Technologist: Darlin Coco RDMS  Examination Guidelines: A complete evaluation includes B-mode imaging, spectral Doppler, color Doppler, and power Doppler as needed of all accessible portions of each vessel. Bilateral testing is considered an integral part of a complete examination. Limited examinations for reoccurring indications may be performed as noted. The reflux portion of the exam is performed with the patient in reverse Trendelenburg.  +---------+---------------+---------+-----------+----------+-------------------+ RIGHT    CompressibilityPhasicitySpontaneityPropertiesThrombus Aging      +---------+---------------+---------+-----------+----------+-------------------+ CFV      Full           Yes      Yes                                      +---------+---------------+---------+-----------+----------+-------------------+ SFJ      Full                                                             +---------+---------------+---------+-----------+----------+-------------------+ FV Prox  Full                                                              +---------+---------------+---------+-----------+----------+-------------------+ FV Mid   Full                                                             +---------+---------------+---------+-----------+----------+-------------------+ FV DistalFull                                                             +---------+---------------+---------+-----------+----------+-------------------+ PFV      Full                                                             +---------+---------------+---------+-----------+----------+-------------------+  POP      Full           No       Yes                  Rouleaux flow noted +---------+---------------+---------+-----------+----------+-------------------+ PTV      Full                                                             +---------+---------------+---------+-----------+----------+-------------------+ PERO     Full                                                             +---------+---------------+---------+-----------+----------+-------------------+   +---------+---------------+---------+-----------+----------+-------------------+ LEFT     CompressibilityPhasicitySpontaneityPropertiesThrombus Aging      +---------+---------------+---------+-----------+----------+-------------------+ CFV      Full           Yes      Yes                                      +---------+---------------+---------+-----------+----------+-------------------+ SFJ      Full                                                             +---------+---------------+---------+-----------+----------+-------------------+ FV Prox  Full                                                             +---------+---------------+---------+-----------+----------+-------------------+ FV Mid   Full                                                             +---------+---------------+---------+-----------+----------+-------------------+ FV  DistalFull                                                             +---------+---------------+---------+-----------+----------+-------------------+ PFV      Full                                                             +---------+---------------+---------+-----------+----------+-------------------+ POP      Full  No       Yes                  Rouleaux flow noted +---------+---------------+---------+-----------+----------+-------------------+ PTV      Full                                                             +---------+---------------+---------+-----------+----------+-------------------+ PERO     Full                                                             +---------+---------------+---------+-----------+----------+-------------------+     Summary: RIGHT: - There is no evidence of deep vein thrombosis in the lower extremity.  - No cystic structure found in the popliteal fossa.  LEFT: - There is no evidence of deep vein thrombosis in the lower extremity.  - No cystic structure found in the popliteal fossa.  *See table(s) above for measurements and observations. Electronically signed by Jamelle Haring on 01/31/2021 at 10:53:05 AM.    Final    ECHOCARDIOGRAM LIMITED  Result Date: 01/30/2021    ECHOCARDIOGRAM LIMITED REPORT   Patient Name:   Kurt Williams Ewing Date of Exam: 01/30/2021 Medical Rec #:  [id number]    Height:       72.0 in Accession #:    [id number]   Weight:       237.2 lb Date of Birth:  [birth year]    BSA:          2.291 m Patient Age:    53 years     BP:           103/65 mmHg Patient Gender: M            HR:           52 bpm. Exam Location:  Inpatient Procedure: Limited Echo, Cardiac Doppler and Color Doppler Indications:    CHF-Acute diastolic  History:        Patient has no prior history of Echocardiogram examinations.                 CAD; Risk Factors:Sleep Apnea. CKD. GERD.  Sonographer:    Clayton Lefort RDCS (AE) Referring Phys: [id number] Margaree Mackintosh  [place]  1. Left ventricular ejection fraction, by estimation, is 60 to 65%. The left ventricle has normal function. The left ventricle has no regional Guiles motion abnormalities. There is mild left ventricular hypertrophy. Left ventricular diastolic parameters are consistent with Grade I diastolic dysfunction (impaired relaxation).  2. Right ventricular systolic function is normal. The right ventricular size is normal.  3. The mitral valve is normal in structure. No evidence of mitral valve regurgitation. No evidence of mitral stenosis.  4. The aortic valve is normal in structure. Aortic valve regurgitation is not visualized. No aortic stenosis is present.  5. The inferior vena cava is normal in size with greater than 50% respiratory variability, suggesting right atrial pressure of 3 mmHg. FINDINGS  Left Ventricle: Left ventricular ejection fraction, by estimation, is 60 to 65%. The left ventricle has normal function. The left ventricle has no regional Stevens motion abnormalities. The  left ventricular internal cavity size was normal in size. There is  mild left ventricular hypertrophy. Left ventricular diastolic parameters are consistent with Grade I diastolic dysfunction (impaired relaxation). Right Ventricle: The right ventricular size is normal. No increase in right ventricular Wilz thickness. Right ventricular systolic function is normal. Left Atrium: Left atrial size was normal in size. Right Atrium: Right atrial size was normal in size. Pericardium: There is no evidence of pericardial effusion. Mitral Valve: The mitral valve is normal in structure. No evidence of mitral valve stenosis. Tricuspid Valve: The tricuspid valve is normal in structure. Tricuspid valve regurgitation is not demonstrated. No evidence of tricuspid stenosis. Aortic Valve: The aortic valve is normal in structure. Aortic valve regurgitation is not visualized. No aortic stenosis is present. Aortic valve mean gradient measures 5.0  mmHg. Aortic valve peak gradient measures 7.1 mmHg. Aortic valve area, by VTI measures 2.23 cm. Pulmonic Valve: The pulmonic valve was normal in structure. Pulmonic valve regurgitation is not visualized. No evidence of pulmonic stenosis. Aorta: The aortic root is normal in size and structure. Venous: The inferior vena cava is normal in size with greater than 50% respiratory variability, suggesting right atrial pressure of 3 mmHg. IAS/Shunts: No atrial level shunt detected by color flow Doppler. LEFT VENTRICLE PLAX 2D LVIDd:         4.30 cm  Diastology LVIDs:         2.80 cm  LV e' medial:    5.22 cm/s LV PW:         1.40 cm  LV E/e' medial:  10.7 LV IVS:        1.20 cm  LV e' lateral:   6.85 cm/s LVOT diam:     2.00 cm  LV E/e' lateral: 8.2 LV SV:         69 LV SV Index:   30 LVOT Area:     3.14 cm  IVC IVC diam: 1.80 cm LEFT ATRIUM         Index LA diam:    3.20 cm 1.40 cm/m  AORTIC VALVE AV Area (Vmax):    2.65 cm AV Area (Vmean):   2.20 cm AV Area (VTI):     2.23 cm AV Vmax:           133.00 cm/s AV Vmean:          103.000 cm/s AV VTI:            0.310 m AV Peak Grad:      7.1 mmHg AV Mean Grad:      5.0 mmHg LVOT Vmax:         112.00 cm/s LVOT Vmean:        72.000 cm/s LVOT VTI:          0.220 m LVOT/AV VTI ratio: 0.71  AORTA Ao Root diam: 3.70 cm Ao Asc diam:  3.30 cm MITRAL VALVE MV Area (PHT): 2.44 cm    SHUNTS MV Decel Time: 311 msec    Systemic VTI:  0.22 m MV E velocity: 56.10 cm/s  Systemic Diam: 2.00 cm MV A velocity: 50.60 cm/s MV E/A ratio:  1.11 Candee Furbish MD Electronically signed by Candee Furbish MD Signature Date/Time: 01/30/2021/3:26:43 PM    Final

## 2021-02-06 NOTE — Plan of Care (Signed)
  Problem: Clinical Measurements: Goal: Ability to maintain clinical measurements within normal limits will improve Outcome: Progressing Goal: Diagnostic test results will improve Outcome: Progressing Goal: Respiratory complications will improve Outcome: Progressing   Problem: Activity: Goal: Risk for activity intolerance will decrease Outcome: Progressing   Problem: Coping: Goal: Level of anxiety will decrease Outcome: Progressing   Problem: Pain Managment: Goal: General experience of comfort will improve Outcome: Progressing   Problem: Safety: Goal: Ability to remain free from injury will improve Outcome: Progressing   Problem: Respiratory: Goal: Will maintain a patent airway Outcome: Progressing Goal: Complications related to the disease process, condition or treatment will be avoided or minimized Outcome: Progressing

## 2021-02-06 NOTE — Care Management Important Message (Signed)
Important Message  Patient Details  Name: Kurt Williams MRN: [id number] Date of Birth: 03-15-1951   Medicare Important Message Given:  Yes - Important Message mailed due to current National Emergency  Verbal consent obtained due to current National Emergency  Relationship to patient: Self Contact Name: Laronn Call Date: 02/06/21  Time: 1408 Phone: [phone] Outcome: Spoke with contact Important Message mailed to: Patient address on file    [place] 02/06/2021, 2:08 PM

## 2021-02-07 DIAGNOSIS — U071 COVID-19: Secondary | ICD-10-CM | POA: Diagnosis not present

## 2021-02-07 DIAGNOSIS — J9601 Acute respiratory failure with hypoxia: Secondary | ICD-10-CM | POA: Diagnosis not present

## 2021-02-07 LAB — GLUCOSE, CAPILLARY
Glucose-Capillary: 104 mg/dL — ABNORMAL HIGH (ref 70–99)
Glucose-Capillary: 123 mg/dL — ABNORMAL HIGH (ref 70–99)
Glucose-Capillary: 170 mg/dL — ABNORMAL HIGH (ref 70–99)
Glucose-Capillary: 105 mg/dL — ABNORMAL HIGH (ref 70–99)

## 2021-02-07 NOTE — Progress Notes (Signed)
PROGRESS NOTE                                                                                                                                                                                                             Patient Demographics:    Kurt Williams, is a 70 y.o. male, DOB - 08-Nov-1951, BLT:[id number]  Outpatient Primary MD for the patient is Shelda Pal, DO    LOS - 9  Admit date - 01/17/2021    Chief Complaint  Patient presents with  . Covid Positive       Brief Narrative (HPI from H&P) - Kurt Williams is a 70 y.o. male with medical history significant of CAD, OSA on CPAP. Pt presents to the ED with c/o SOB and cough ongoing for 5-6 days, he is unvaccinated, he was diagnosed with severe hypoxic Resp failure due to Covid PNA.   Subjective:   No acute issues or events overnight; continues to become somewhat dyspneic even with minimal exertion but denies nausea vomiting diarrhea constipation headache fevers chills chest pain.   Assessment  & Plan :     Acute Hypoxic Resp. Failure due to Acute Covid 19 Viral pneumonia ARDS resolving - Unvaccinated with prolonged illness prior to admission - Moderate to severe disease given imaging and symptoms - Continues to improve minimally, likely prolonged hypoxic phase given age and delayed care - Completed Remdesivir/Actemra - Continues on moderate dose Lovenox overall remains extremely tenuous - markedly dyspneic with even minimal exertion - Continue incentive spirometry, flutter, proning, early ambulation  - Continue to wean oxygen aggressively, goal sats 88 to 92% SpO2: 94 % O2 Flow Rate (L/min): 30 L/min FiO2 (%): 100 %  Elevated D-dimer due to COVID-19 and profound inflammation - Continues on moderate dose Lovenox, negative leg ultrasound.  CAD - Chest pain-free currently on aspirin and statin for secondary prevention.  Stable echocardiogram EF 60%, no  WMA. EKG Nonacute.  AKI on questionable CKD3A - Approaching baseline - Baseline creatinine around 1.4. resolved after IVF Lab Results  Component Value Date   CREATININE 1.49 (H) 02/06/2021   CREATININE 1.56 (H) 02/06/2021   CREATININE 1.49 (H) 02/05/2021   OSA  - CPAP nightly - ordered, if he can tolerate with max Fio2.      Condition - Guarded Family Communication  : Daughter/Wife updated  at length over the phone Code Status :  Full Consults  :  None PUD Prophylaxis : PPI   Procedures  :     Echocardiogram - 1. Left ventricular ejection fraction, by estimation, is 60 to 65%. The left ventricle has normal function. The left ventricle has no regional Swofford motion abnormalities. There is mild left ventricular hypertrophy. Left ventricular diastolic parameters are consistent with Grade I diastolic dysfunction (impaired relaxation). 2. Right ventricular systolic function is normal. The right ventricular size is normal. 3. The mitral valve is normal in structure. No evidence of mitral valve regurgitation. No evidence of mitral stenosis. 4. The aortic valve is normal in structure. Aortic valve regurgitation is not visualized. No aortic stenosis is present.  5. The inferior vena cava is normal in size with greater than 50% respiratory variability, suggesting right atrial pressure of 3 mmHg.  Leg ultrasound - No DVT      Disposition Plan:  Status is: Inpatient  Remains inpatient appropriate because: IV treatments appropriate due to intensity of illness or inability to take PO  Dispo: The patient is from: Home             Anticipated d/c is to: LTAC             Anticipated d/c date is: 2 days             Patient currently is medically stable to d/c.  Difficult to place patient No  DVT Prophylaxis : Lovenox Lab Results  Component Value Date   PLT 308 02/06/2021   Diet :  Diet Order            Diet Heart Room service appropriate? Yes; Fluid consistency: Thin  Diet effective now                 Inpatient Medications Scheduled Meds: . aspirin  81 mg Oral Daily  . enoxaparin (LOVENOX) injection  55 mg Subcutaneous Q24H  . insulin aspart  0-9 Units Subcutaneous TID AC & HS  . melatonin  5 mg Oral QHS  . methylPREDNISolone (SOLU-MEDROL) injection  40 mg Intravenous Q12H  . pantoprazole  40 mg Oral Daily  . rosuvastatin  5 mg Oral Daily  . tamsulosin  0.4 mg Oral Daily   Continuous Infusions:  PRN Meds:.acetaminophen, nitroGLYCERIN, [DISCONTINUED] ondansetron **OR** ondansetron (ZOFRAN) IV  Antibiotics : Anti-infectives (From admission, onward)   Start     Dose/Rate Route Frequency Ordered Stop   01/30/21 1800  remdesivir 100 mg in sodium chloride 0.9 % 100 mL IVPB  Status:  Discontinued       "Followed by" Linked Group Details   100 mg 200 mL/hr over 30 Minutes Intravenous Daily 01/23/2021 2311 01/29/21 1041   01/29/21 1600  remdesivir 100 mg in sodium chloride 0.9 % 100 mL IVPB       "Followed by" Linked Group Details   100 mg 200 mL/hr over 30 Minutes Intravenous Daily 01/29/21 1041 02/01/21 1001   01/29/2021 2339  remdesivir 100 mg in sodium chloride 0.9 % 100 mL IVPB       "Followed by" Linked Group Details   100 mg 200 mL/hr over 30 Minutes Intravenous  Once 01/24/2021 2311 01/29/21 0028   02/07/2021 2315  remdesivir 100 mg in sodium chloride 0.9 % 100 mL IVPB       "Followed by" Linked Group Details   100 mg 200 mL/hr over 30 Minutes Intravenous  Once 01/21/2021 2311 02/02/2021 2353  Time Spent in minutes  [place] DO 02/07/2021 at 3:08 PM  Pager: Secure [facility] -  Office  [phone] See all Orders from today for further details  Objective:   Vitals:   02/07/21 0540 02/07/21 0720 02/07/21 1113 02/07/21 1140  BP:  99/64  108/62  Pulse:  69 86 86  Resp: (!) 25 20 20 20   Temp:  98.7 F (37.1 C)  98 F (36.7 C)  TempSrc:  Oral  Oral  SpO2:  90% 92% 94%  Weight:      Height:       Wt Readings from Last 3  Encounters:  01/29/21 107.6 kg  01/15/2021 108.9 kg  01/09/21 114.8 kg    Intake/Output Summary (Last 24 hours) at 02/07/2021 1508 Last data filed at 02/07/2021 0600 Gross per 24 hour  Intake 720 ml  Output 1900 ml  Net -1180 ml   Physical Exam Awake Alert, No new F.N deficits, Normal affect [place].AT,PERRAL Supple Neck,No JVD, No cervical lymphadenopathy appriciated.  Symmetrical Chest Bry movement, good air movement bilaterally, few rales RRR,No Gallops, Rubs or new Murmurs +ve B.Sounds, Abd Soft, No tenderness, No organomegaly appriciated, No rebound - guarding or rigidity. No Cyanosis, Clubbing or edema, No new Rash or bruise   Data Review:   CBC Recent Labs  Lab 02/01/21 0156 02/02/21 0028 02/04/21 0055 02/05/21 0225 02/06/21 0206 02/06/21 0830  WBC 5.9 9.0 8.7 10.3 10.1 11.7*  HGB 12.2* 12.0* 12.3* 13.4 12.9* 13.6  HCT 37.6* 36.7* 36.1* 41.6 37.8* 42.2  PLT 195 238 312 359 322 308  MCV 91.9 91.8 89.8 91.8 90.2 91.3  MCH 29.8 30.0 30.6 29.6 30.8 29.4  MCHC 32.4 32.7 34.1 32.2 34.1 32.2  RDW 13.1 13.0 13.1 13.2 13.2 13.4  LYMPHSABS 0.7 0.7 0.3* 0.4* 0.3*  --   MONOABS 0.6 0.6 0.2 0.2 0.2  --   EOSABS 0.0 0.0 0.0 0.0 0.1  --   BASOSABS 0.0 0.0 0.0 0.0 0.0  --     Recent Labs  Lab 02/01/21 0156 02/02/21 0028 02/03/21 0723 02/04/21 0055 02/05/21 0225 02/06/21 0206 02/06/21 0830  NA 135 134* 134* 133* 134* 132* 133*  K 4.5 4.4 4.4 4.7 5.1 5.1 4.9  CL 100 102 104 100 99 100 99  CO2 25 23 22 23 25 23 23   GLUCOSE 135* 134* 111* 141* 128* 119* 105*  BUN 31* 31* 27* 28* 26* 29* 29*  CREATININE 1.60* 1.50* 1.38* 1.46* 1.49* 1.56* 1.49*  CALCIUM 9.1 9.1 9.3 9.2 9.7 9.6 9.9  AST 53* 43*  --  41 49* 47* 50*  ALT 54* 49*  --  45* 48* 44 45*  ALKPHOS 49 51  --  54 61 60 62  BILITOT 1.1 0.7  --  0.7 1.1 1.0 1.1  ALBUMIN 3.1* 3.1*  --  3.2* 3.4* 3.3* 3.5  MG 2.4 2.4  --  2.5* 2.4  --   --   CRP 0.7 0.7  --  0.5 0.5  --   --   DDIMER 0.98* 1.02*  --  1.70* 2.67*  --    --   PROCALCITON <0.10 <0.10  --   --   --   --   --   BNP 21.2 29.3 23.7 16.9 18.2  --   --     ------------------------------------------------------------------------------------------------------------------ No results for input(s): CHOL, HDL, LDLCALC, TRIG, CHOLHDL, LDLDIRECT in the last 72 hours.  Lab Results  Component Value Date  HGBA1C 6.0 (H) 01/29/2021   ------------------------------------------------------------------------------------------------------------------ No results for input(s): TSH, T4TOTAL, T3FREE, THYROIDAB in the last 72 hours.  Invalid input(s): FREET3  Cardiac Enzymes No results for input(s): CKMB, TROPONINI, MYOGLOBIN in the last 168 hours.  Invalid input(s): CK ------------------------------------------------------------------------------------------------------------------    Component Value Date/Time   BNP 18.2 02/05/2021 0225    Micro Results Recent Results (from the past 240 hour(s))  Blood Culture (routine x 2)     Status: None   Collection Time: 01/18/2021 10:08 PM   Specimen: BLOOD  Result Value Ref Range Status   Specimen Description   Final    BLOOD Blood Culture results may not be optimal due to an excessive volume of blood received in culture bottles Performed at [facility], [address] [place], [place] [address]    Special Requests   Final    BOTTLES DRAWN AEROBIC AND ANAEROBIC LEFT ANTECUBITAL Performed at [facility], [address], [place], [place] [address]    Culture   Final    NO GROWTH 5 DAYS Performed at [facility], [place] [address] [place], [place] [address]    Report Status 02/03/2021 FINAL  Final  Resp Panel by RT-PCR (Flu A&B, Covid) Nasopharyngeal Swab     Status: Abnormal   Collection Time: 01/17/2021 10:20 PM   Specimen: Nasopharyngeal Swab; Nasopharyngeal(NP) swabs in vial transport medium  Result Value Ref Range Status   SARS Coronavirus 2 by RT PCR POSITIVE (A) NEGATIVE  Final    Comment: RESULT CALLED TO, READ BACK BY AND VERIFIED WITH: ALFRED DILLARD RN AT 9767 BY AV CALLED ON 02/08/2021 BY A VASSER (NOTE) SARS-CoV-2 target nucleic acids are DETECTED.  The SARS-CoV-2 RNA is generally detectable in upper respiratory specimens during the acute phase of infection. Positive results are indicative of the presence of the identified virus, but do not rule out bacterial infection or co-infection with other pathogens not detected by the test. Clinical correlation with patient history and other diagnostic information is necessary to determine patient infection status. The expected result is Negative.  Fact Sheet for Patients: EntrepreneurPulse.com.au  Fact Sheet for Healthcare Providers: IncredibleEmployment.be  This test is not yet approved or cleared by the [place] FDA and  has been authorized for detection and/or diagnosis of SARS-CoV-2 by FDA under an Emergency Use Authorization (EUA).  This EUA will remain in ef fect (meaning this test can be used) for the duration of  the COVID-19 declaration under Section 564(b)(1) of the Act, 21 U.S.C. section 360bbb-3(b)(1), unless the authorization is terminated or revoked sooner.     Influenza A by PCR NEGATIVE NEGATIVE Final   Influenza B by PCR NEGATIVE NEGATIVE Final    Comment: (NOTE) The Xpert Xpress SARS-CoV-2/FLU/RSV plus assay is intended as an aid in the diagnosis of influenza from Nasopharyngeal swab specimens and should not be used as a sole basis for treatment. Nasal washings and aspirates are unacceptable for Xpert Xpress SARS-CoV-2/FLU/RSV testing.  Fact Sheet for Patients: EntrepreneurPulse.com.au  Fact Sheet for Healthcare Providers: IncredibleEmployment.be  This test is not yet approved or cleared by the [place] FDA and has been authorized for detection and/or diagnosis of SARS-CoV-2 by FDA under an  Emergency Use Authorization (EUA). This EUA will remain in effect (meaning this test can be used) for the duration of the COVID-19 declaration under Section 564(b)(1) of the Act, 21 U.S.C. section 360bbb-3(b)(1), unless the authorization is terminated or revoked.  Performed at [facility]  [address], [place] [place], [place] [address]   Blood Culture (routine x 2)     Status: None   Collection Time: 01/15/2021 10:23 PM   Specimen: BLOOD  Result Value Ref Range Status   Specimen Description   Final    BLOOD Blood Culture adequate volume Performed at [facility], [address] [place], [place] [address]    Special Requests   Final    BOTTLES DRAWN AEROBIC AND ANAEROBIC RIGHT ANTECUBITAL Performed at [facility], [address] [place], [place] [address]    Culture   Final    NO GROWTH 5 DAYS Performed at [facility], [place] [address] [place], [place] [address]    Report Status 02/03/2021 FINAL  Final  MRSA PCR Screening     Status: None   Collection Time: 01/30/21  6:08 PM   Specimen: Nasal Mucosa; Nasopharyngeal  Result Value Ref Range Status   MRSA by PCR NEGATIVE NEGATIVE Final    Comment:        The GeneXpert MRSA Assay (FDA approved for NASAL specimens only), is one component of a comprehensive MRSA colonization surveillance program. It is not intended to diagnose MRSA infection nor to guide or monitor treatment for MRSA infections. Performed at [facility], [place] [address] [place], [place] [address]     Radiology Reports DG Chest [place] 1 View  Result Date: 02/01/2021 CLINICAL DATA:  Shortness of breath.  COVID positive. EXAM: PORTABLE CHEST 1 VIEW COMPARISON:  01/31/2021 FINDINGS: Stable cardiomediastinal contours. Persistent low lung volumes. Mild diffuse increase interstitial markings noted bilaterally. More focal airspace disease is noted within the left lung base. The visualized osseous structures appear intact.  IMPRESSION: 1. Stable low lung volumes and increased interstitial markings bilaterally. 2. More focal airspace disease within the left lung base, unchanged from previous exam. Electronically Signed   By: Kerby Moors M.D.   On: 02/01/2021 09:20   DG Chest Port 1 View  Result Date: 01/31/2021 CLINICAL DATA:  70 year old male with a history of shortness of breath and COVID EXAM: PORTABLE CHEST 1 VIEW COMPARISON:  01/29/2021, 01/22/2021 FINDINGS: Cardiomediastinal silhouette unchanged. Low lung volumes persist. Reticulonodular opacities of the lungs, with increasing airspace disease at the left lung base and development of air bronchograms. No pneumothorax. No large pleural effusion. IMPRESSION: Increasing interstitial and airspace disease of the bilateral lungs particularly at the left lung base. Given the air bronchograms, lobar pneumonia/aspiration pneumonia also considered in addition to viral pneumonia. Electronically Signed   By: Corrie Mckusick D.O.   On: 01/31/2021 08:29   DG Chest Port 1 View  Result Date: 01/29/2021 CLINICAL DATA:  Shortness of breath, COVID-19 EXAM: PORTABLE CHEST 1 VIEW COMPARISON:  Portable exam 0820 hours compared to 01/27/2021 FINDINGS: Normal heart size, mediastinal contours, and pulmonary vascularity. Patchy infiltrates in mid to lower LEFT lung and minimally at RIGHT base consistent with multifocal pneumonia. RIGHT lung infiltrate is perhaps slightly improved since previous exam. No pleural effusion or pneumothorax. Bones unremarkable. IMPRESSION: BILATERAL pulmonary infiltrates consistent with multifocal pneumonia, perhaps minimally improved in RIGHT lung. Electronically Signed   By: Lavonia Dana M.D.   On: 01/29/2021 08:34   DG Chest Port 1 View  Result Date: 01/19/2021 CLINICAL DATA:  Cough.  Coronavirus infection. EXAM: PORTABLE CHEST 1 VIEW COMPARISON:  11/24/2017 FINDINGS: Heart size is normal. Chronic aortic atherosclerosis and tortuosity as seen previously. Hazy  pulmonary infiltrates in the mid  and lower lungs consistent with viral pneumonia. No dense consolidation. Chronic elevation of the left hemidiaphragm. No evidence of effusion. No acute bone finding. IMPRESSION: 1. Hazy pulmonary infiltrates in the mid and lower lungs consistent with viral pneumonia. No dense consolidation. 2. Aortic atherosclerosis. Electronically Signed   By: Nelson Chimes M.D.   On: 01/27/2021 22:21   VAS [place] LOWER EXTREMITY VENOUS (DVT)  Result Date: 01/31/2021  Lower Venous DVT Study Indications: Covid, d-dimer.  Anticoagulation: Lovenox. Comparison Study: No prior studies. Performing Technologist: Darlin Coco RDMS  Examination Guidelines: A complete evaluation includes B-mode imaging, spectral Doppler, color Doppler, and power Doppler as needed of all accessible portions of each vessel. Bilateral testing is considered an integral part of a complete examination. Limited examinations for reoccurring indications may be performed as noted. The reflux portion of the exam is performed with the patient in reverse Trendelenburg.  +---------+---------------+---------+-----------+----------+-------------------+ RIGHT    CompressibilityPhasicitySpontaneityPropertiesThrombus Aging      +---------+---------------+---------+-----------+----------+-------------------+ CFV      Full           Yes      Yes                                      +---------+---------------+---------+-----------+----------+-------------------+ SFJ      Full                                                             +---------+---------------+---------+-----------+----------+-------------------+ FV Prox  Full                                                             +---------+---------------+---------+-----------+----------+-------------------+ FV Mid   Full                                                              +---------+---------------+---------+-----------+----------+-------------------+ FV DistalFull                                                             +---------+---------------+---------+-----------+----------+-------------------+ PFV      Full                                                             +---------+---------------+---------+-----------+----------+-------------------+ POP      Full           No       Yes  Rouleaux flow noted +---------+---------------+---------+-----------+----------+-------------------+ PTV      Full                                                             +---------+---------------+---------+-----------+----------+-------------------+ PERO     Full                                                             +---------+---------------+---------+-----------+----------+-------------------+   +---------+---------------+---------+-----------+----------+-------------------+ LEFT     CompressibilityPhasicitySpontaneityPropertiesThrombus Aging      +---------+---------------+---------+-----------+----------+-------------------+ CFV      Full           Yes      Yes                                      +---------+---------------+---------+-----------+----------+-------------------+ SFJ      Full                                                             +---------+---------------+---------+-----------+----------+-------------------+ FV Prox  Full                                                             +---------+---------------+---------+-----------+----------+-------------------+ FV Mid   Full                                                             +---------+---------------+---------+-----------+----------+-------------------+ FV DistalFull                                                             +---------+---------------+---------+-----------+----------+-------------------+  PFV      Full                                                             +---------+---------------+---------+-----------+----------+-------------------+ POP      Full           No       Yes                  Rouleaux flow noted +---------+---------------+---------+-----------+----------+-------------------+ PTV  Full                                                             +---------+---------------+---------+-----------+----------+-------------------+ PERO     Full                                                             +---------+---------------+---------+-----------+----------+-------------------+     Summary: RIGHT: - There is no evidence of deep vein thrombosis in the lower extremity.  - No cystic structure found in the popliteal fossa.  LEFT: - There is no evidence of deep vein thrombosis in the lower extremity.  - No cystic structure found in the popliteal fossa.  *See table(s) above for measurements and observations. Electronically signed by Jamelle Haring on 01/31/2021 at 10:53:05 AM.    Final    ECHOCARDIOGRAM LIMITED  Result Date: 01/30/2021    ECHOCARDIOGRAM LIMITED REPORT   Patient Name:   Kurt Williams Date of Exam: 01/30/2021 Medical Rec #:  [id number]    Height:       72.0 in Accession #:    [id number]   Weight:       237.2 lb Date of Birth:  September 23, 1951    BSA:          2.291 m Patient Age:    64 years     BP:           103/65 mmHg Patient Gender: M            HR:           52 bpm. Exam Location:  Inpatient Procedure: Limited Echo, Cardiac Doppler and Color Doppler Indications:    CHF-Acute diastolic  History:        Patient has no prior history of Echocardiogram examinations.                 CAD; Risk Factors:Sleep Apnea. CKD. GERD.  Sonographer:    Clayton Lefort RDCS (AE) Referring Phys: [id number]intosh [place]  1. Left ventricular ejection fraction, by estimation, is 60 to 65%. The left ventricle has normal function. The left ventricle has no regional  Albo motion abnormalities. There is mild left ventricular hypertrophy. Left ventricular diastolic parameters are consistent with Grade I diastolic dysfunction (impaired relaxation).  2. Right ventricular systolic function is normal. The right ventricular size is normal.  3. The mitral valve is normal in structure. No evidence of mitral valve regurgitation. No evidence of mitral stenosis.  4. The aortic valve is normal in structure. Aortic valve regurgitation is not visualized. No aortic stenosis is present.  5. The inferior vena cava is normal in size with greater than 50% respiratory variability, suggesting right atrial pressure of 3 mmHg. FINDINGS  Left Ventricle: Left ventricular ejection fraction, by estimation, is 60 to 65%. The left ventricle has normal function. The left ventricle has no regional Stills motion abnormalities. The left ventricular internal cavity size was normal in size. There is  mild left ventricular hypertrophy. Left ventricular diastolic parameters are consistent with Grade I diastolic dysfunction (impaired relaxation). Right Ventricle: The right ventricular size  is normal. No increase in right ventricular [place] thickness. Right ventricular systolic function is normal. Left Atrium: Left atrial size was normal in size. Right Atrium: Right atrial size was normal in size. Pericardium: There is no evidence of pericardial effusion. Mitral Valve: The mitral valve is normal in structure. No evidence of mitral valve stenosis. Tricuspid Valve: The tricuspid valve is normal in structure. Tricuspid valve regurgitation is not demonstrated. No evidence of tricuspid stenosis. Aortic Valve: The aortic valve is normal in structure. Aortic valve regurgitation is not visualized. No aortic stenosis is present. Aortic valve mean gradient measures 5.0 mmHg. Aortic valve peak gradient measures 7.1 mmHg. Aortic valve area, by VTI measures 2.23 cm. Pulmonic Valve: The pulmonic valve was normal in structure. Pulmonic  valve regurgitation is not visualized. No evidence of pulmonic stenosis. Aorta: The aortic root is normal in size and structure. Venous: The inferior vena cava is normal in size with greater than 50% respiratory variability, suggesting right atrial pressure of 3 mmHg. IAS/Shunts: No atrial level shunt detected by color flow Doppler. LEFT VENTRICLE PLAX 2D LVIDd:         4.30 cm  Diastology LVIDs:         2.80 cm  LV e' medial:    5.22 cm/s LV PW:         1.40 cm  LV E/e' medial:  10.7 LV IVS:        1.20 cm  LV e' lateral:   6.85 cm/s LVOT diam:     2.00 cm  LV E/e' lateral: 8.2 LV SV:         69 LV SV Index:   30 LVOT Area:     3.14 cm  IVC IVC diam: 1.80 cm LEFT ATRIUM         Index LA diam:    3.20 cm 1.40 cm/m  AORTIC VALVE AV Area (Vmax):    2.65 cm AV Area (Vmean):   2.20 cm AV Area (VTI):     2.23 cm AV Vmax:           133.00 cm/s AV Vmean:          103.000 cm/s AV VTI:            0.310 m AV Peak Grad:      7.1 mmHg AV Mean Grad:      5.0 mmHg LVOT Vmax:         112.00 cm/s LVOT Vmean:        72.000 cm/s LVOT VTI:          0.220 m LVOT/AV VTI ratio: 0.71  AORTA Ao Root diam: 3.70 cm Ao Asc diam:  3.30 cm MITRAL VALVE MV Area (PHT): 2.44 cm    SHUNTS MV Decel Time: 311 msec    Systemic VTI:  0.22 m MV E velocity: 56.10 cm/s  Systemic Diam: 2.00 cm MV A velocity: 50.60 cm/s MV E/A ratio:  1.11 Candee Furbish MD Electronically signed by Candee Furbish MD Signature Date/Time: 01/30/2021/3:26:43 PM    Final

## 2021-02-08 DIAGNOSIS — J9601 Acute respiratory failure with hypoxia: Secondary | ICD-10-CM | POA: Diagnosis not present

## 2021-02-08 DIAGNOSIS — U071 COVID-19: Secondary | ICD-10-CM | POA: Diagnosis not present

## 2021-02-08 LAB — GLUCOSE, CAPILLARY
Glucose-Capillary: 104 mg/dL — ABNORMAL HIGH (ref 70–99)
Glucose-Capillary: 138 mg/dL — ABNORMAL HIGH (ref 70–99)
Glucose-Capillary: 117 mg/dL — ABNORMAL HIGH (ref 70–99)
Glucose-Capillary: 114 mg/dL — ABNORMAL HIGH (ref 70–99)

## 2021-02-08 NOTE — Progress Notes (Signed)
Physical Therapy Treatment Patient Details Name: Kurt Williams MRN: [id number] DOB: 1951-03-24 Today's Date: 02/08/2021    History of Present Illness Pt is a 70 y.o. male admitted 01/18/2021 with SOB and cough; workup for acute hypoxic respiratory failure due to COVID-19 pneumonitis; requiring heated HFNC. No evidence of DVT. PMH includes CAD, OSA on CPAP.    PT Comments    Pt received in recliner on 25L HHFNC 70% FiO2, SpO2 89%. Pt tolerated bouts of mobility and/or LE ex for 1-2 minutes before desat 70-75%. Addition of 15L NRB required to recover to 84%. 3-5 minute seated rest break between all activity trials. Pt performed LE exercises seated in recliner. Sit to stand x 2 trials min guard assist. Static stand each trial min guard assist, 30 sec then 40 sec. SpO2 85% on 25L seated in recliner at end of session. Fatigue noted.    Follow Up Recommendations  LTACH;Supervision - Intermittent     Equipment Recommendations  Other (comment) (defer)    Recommendations for Other Services       Precautions / Restrictions Precautions Precautions: Fall;Other (comment) Precaution Comments: Watch SpO2 on HHFNC    Mobility  Bed Mobility               General bed mobility comments: pt sitting in recliner    Transfers Overall transfer level: Needs assistance Equipment used: None Transfers: Sit to/from Stand Sit to Stand: Min guard         General transfer comment: sit to stand x 2 trials, 5 minute seated rest between trials  Ambulation/Gait                 Stairs             Wheelchair Mobility    Modified Rankin (Stroke Patients Only)       Balance Overall balance assessment: Needs assistance Sitting-balance support: Feet supported;No upper extremity supported Sitting balance-Leahy Scale: Good     Standing balance support: No upper extremity supported;During functional activity Standing balance-Leahy Scale: Fair Standing balance comment: static stand  x 2 trials, 30 sec then 40 seconds, 5 minute seated rest between trials                            Cognition Arousal/Alertness: Awake/alert Behavior During Therapy: WFL for tasks assessed/performed;Flat affect Overall Cognitive Status: Within Functional Limits for tasks assessed                                        Exercises General Exercises - Lower Extremity Ankle Circles/Pumps: AROM;Both;10 reps;Seated Long Arc Quad: AROM;Right;Left;10 reps;Seated Hip ABduction/ADduction: AROM;Both;10 reps;Seated Hip Flexion/Marching: AROM;Right;Left;10 reps;Seated    General Comments General comments (skin integrity, edema, etc.): SpO2 89% at rest on 25L HHFNC 70% FiO2 on arrival. Desat 70-75% with all mobility attempts, requiring 3-5 minute recovery with NRB to return to 84%. SpO2 85% on 25L at rest at end of session.      Pertinent Vitals/Pain Pain Assessment: Faces Faces Pain Scale: Hurts a little bit Pain Location: chest and upper back tightness/pressure Pain Descriptors / Indicators: Discomfort;Tightness;Pressure Pain Intervention(s): Monitored during session;Repositioned    Home Living                      Prior Function  PT Goals (current goals can now be found in the care plan section) Acute Rehab PT Goals Patient Stated Goal: get better Progress towards PT goals: Progressing toward goals (slowly, limited by fatigue and high O2 needs)    Frequency    Min 3X/week      PT Plan Current plan remains appropriate    Co-evaluation              AM-PAC PT "6 Clicks" Mobility   Outcome Measure  Help needed turning from your back to your side while in a flat bed without using bedrails?: None Help needed moving from lying on your back to sitting on the side of a flat bed without using bedrails?: None Help needed moving to and from a bed to a chair (including a wheelchair)?: A Little Help needed standing up from a chair  using your arms (e.g., wheelchair or bedside chair)?: A Little Help needed to walk in hospital room?: A Little Help needed climbing 3-5 steps with a railing? : A Little 6 Click Score: 20    End of Session Equipment Utilized During Treatment: Oxygen Activity Tolerance: Patient limited by fatigue Patient left: in chair;with call bell/phone within reach Nurse Communication: Mobility status PT Visit Diagnosis: Other abnormalities of gait and mobility (R26.89)     Time: 3267-1245 PT Time Calculation (min) (ACUTE ONLY): 30 min  Charges:  $Therapeutic Exercise: 8-22 mins $Therapeutic Activity: 8-22 mins                     Lorrin Goodell, PT  Office # [phone] Pager [phone]    Lorriane Shire 02/08/2021, 11:55 AM

## 2021-02-08 NOTE — Progress Notes (Signed)
PROGRESS NOTE                                                                                                                                                                                                             Patient Demographics:    Kurt Williams, is a 70 y.o. male, DOB - 09/03/1951, MGQ:[id number]  Outpatient Primary MD for the patient is Shelda Pal, DO    LOS - 10  Admit date - 02/01/2021    Chief Complaint  Patient presents with  . Covid Positive       Brief Narrative (HPI from H&P) - Kurt Williams is a 70 y.o. male with medical history significant of CAD, OSA on CPAP. Pt presents to the ED with c/o SOB and cough ongoing for 5-6 days, he is unvaccinated, he was diagnosed with severe hypoxic Resp failure due to Covid PNA.   Subjective:   No acute issues or events overnight; continues to become somewhat dyspneic even with minimal exertion but indicates his respiratory status and exertion levels are improving slowly over the past few days.   Assessment  & Plan :     Acute Hypoxic Resp. Failure due to Acute Covid 19 Viral pneumonia ARDS resolving - Unvaccinated with prolonged illness prior to admission - Moderate to severe disease given imaging and symptoms - Continues to improve minimally, likely prolonged hypoxic phase given age and delayed care - Completed Remdesivir/Actemra - Continues on moderate dose Lovenox overall remains extremely tenuous - markedly dyspneic with even minimal exertion - Continue incentive spirometry, flutter, proning, early ambulation  - Continue to wean oxygen aggressively, goal sats 88 to 92% SpO2: 92 % O2 Flow Rate (L/min): 25 L/min FiO2 (%): 70 %  Elevated D-dimer due to COVID-19 and profound inflammation - Continues on moderate dose Lovenox, negative leg ultrasound.  CAD - Chest pain-free currently on aspirin and statin for secondary prevention.  Stable  echocardiogram EF 60%, no WMA. EKG Nonacute.  AKI on questionable CKD3A - Approaching baseline - Baseline creatinine around 1.4. resolved after IVF Lab Results  Component Value Date   CREATININE 1.49 (H) 02/06/2021   CREATININE 1.56 (H) 02/06/2021   CREATININE 1.49 (H) 02/05/2021   OSA  - CPAP nightly - ordered, if he can tolerate with max Fio2.      Condition - Guarded Family  Communication  : Daughter/Wife updated at length over the phone Code Status :  Full Consults  :  None PUD Prophylaxis : PPI   Procedures  :     Echocardiogram - 1. Left ventricular ejection fraction, by estimation, is 60 to 65%. The left ventricle has normal function. The left ventricle has no regional Wickstrom motion abnormalities. There is mild left ventricular hypertrophy. Left ventricular diastolic parameters are consistent with Grade I diastolic dysfunction (impaired relaxation). 2. Right ventricular systolic function is normal. The right ventricular size is normal. 3. The mitral valve is normal in structure. No evidence of mitral valve regurgitation. No evidence of mitral stenosis. 4. The aortic valve is normal in structure. Aortic valve regurgitation is not visualized. No aortic stenosis is present.  5. The inferior vena cava is normal in size with greater than 50% respiratory variability, suggesting right atrial pressure of 3 mmHg.  Leg ultrasound - No DVT      Disposition Plan:  Status is: Inpatient  Remains inpatient appropriate because: IV treatments appropriate due to intensity of illness or inability to take PO  Dispo: The patient is from: Home             Anticipated d/c is to: LTAC             Anticipated d/c date is: 2 days             Patient currently is medically stable to d/c.  Currently patient is unable to discharge due to profound hypoxia, unable to transport on 100% heated high flow to facility.  Will follow up with EMS/PT and case management in the next few days.  Difficult to place  patient No  DVT Prophylaxis : Lovenox Lab Results  Component Value Date   PLT 308 02/06/2021   Diet :  Diet Order            Diet Heart Room service appropriate? Yes; Fluid consistency: Thin  Diet effective now                Inpatient Medications Scheduled Meds: . aspirin  81 mg Oral Daily  . enoxaparin (LOVENOX) injection  55 mg Subcutaneous Q24H  . insulin aspart  0-9 Units Subcutaneous TID AC & HS  . melatonin  5 mg Oral QHS  . methylPREDNISolone (SOLU-MEDROL) injection  40 mg Intravenous Q12H  . pantoprazole  40 mg Oral Daily  . rosuvastatin  5 mg Oral Daily  . tamsulosin  0.4 mg Oral Daily   Continuous Infusions:  PRN Meds:.acetaminophen, nitroGLYCERIN, [DISCONTINUED] ondansetron **OR** ondansetron (ZOFRAN) IV  Antibiotics : Anti-infectives (From admission, onward)   Start     Dose/Rate Route Frequency Ordered Stop   01/30/21 1800  remdesivir 100 mg in sodium chloride 0.9 % 100 mL IVPB  Status:  Discontinued       "Followed by" Linked Group Details   100 mg 200 mL/hr over 30 Minutes Intravenous Daily 02/06/2021 2311 01/29/21 1041   01/29/21 1600  remdesivir 100 mg in sodium chloride 0.9 % 100 mL IVPB       "Followed by" Linked Group Details   100 mg 200 mL/hr over 30 Minutes Intravenous Daily 01/29/21 1041 02/01/21 1001   01/17/2021 2339  remdesivir 100 mg in sodium chloride 0.9 % 100 mL IVPB       "Followed by" Linked Group Details   100 mg 200 mL/hr over 30 Minutes Intravenous  Once 01/27/2021 2311 01/29/21 0028   02/09/2021 2315  remdesivir 100  mg in sodium chloride 0.9 % 100 mL IVPB       "Followed by" Linked Group Details   100 mg 200 mL/hr over 30 Minutes Intravenous  Once 01/31/2021 2311 02/01/2021 2353     Time Spent in minutes  [place] DO 02/08/2021 at 11:40 AM  Pager: Secure Chat [facility] -  Office  [phone] See all Orders from today for further details  Objective:   Vitals:   02/08/21 0246 02/08/21 0327 02/08/21 0729  02/08/21 0854  BP:  112/69 (!) 138/55   Pulse: 69 79 74 89  Resp: 20 18 19 20   Temp:  97.8 F (36.6 C)    TempSrc:  Oral    SpO2: 92% 90% 98% 92%  Weight:      Height:       Wt Readings from Last 3 Encounters:  01/29/21 107.6 kg  01/18/2021 108.9 kg  01/09/21 114.8 kg    Intake/Output Summary (Last 24 hours) at 02/08/2021 1140 Last data filed at 02/08/2021 0500 Gross per 24 hour  Intake --  Output 900 ml  Net -900 ml   Physical Exam General:  Pleasantly resting in bed, No acute distress. HEENT:  Normocephalic atraumatic.  Sclerae nonicteric, noninjected.  Extraocular movements intact bilaterally. Neck:  Without mass or deformity.  Trachea is midline. Lungs: Diminished breath sounds bilaterally without overt rhonchi, wheeze, or rales. Heart:  Regular rate and rhythm.  Without murmurs, rubs, or gallops. Abdomen:  Soft, nontender, nondistended.  Without guarding or rebound. Extremities: Without cyanosis, clubbing, edema, or obvious deformity. Vascular:  Dorsalis pedis and posterior tibial pulses palpable bilaterally. Skin:  Warm and dry, no erythema, no ulcerations.    Data Review:   CBC Recent Labs  Lab 02/02/21 0028 02/04/21 0055 02/05/21 0225 02/06/21 0206 02/06/21 0830  WBC 9.0 8.7 10.3 10.1 11.7*  HGB 12.0* 12.3* 13.4 12.9* 13.6  HCT 36.7* 36.1* 41.6 37.8* 42.2  PLT 238 312 359 322 308  MCV 91.8 89.8 91.8 90.2 91.3  MCH 30.0 30.6 29.6 30.8 29.4  MCHC 32.7 34.1 32.2 34.1 32.2  RDW 13.0 13.1 13.2 13.2 13.4  LYMPHSABS 0.7 0.3* 0.4* 0.3*  --   MONOABS 0.6 0.2 0.2 0.2  --   EOSABS 0.0 0.0 0.0 0.1  --   BASOSABS 0.0 0.0 0.0 0.0  --     Recent Labs  Lab 02/02/21 0028 02/03/21 0723 02/04/21 0055 02/05/21 0225 02/06/21 0206 02/06/21 0830  NA 134* 134* 133* 134* 132* 133*  K 4.4 4.4 4.7 5.1 5.1 4.9  CL 102 104 100 99 100 99  CO2 23 22 23 25 23 23   GLUCOSE 134* 111* 141* 128* 119* 105*  BUN 31* 27* 28* 26* 29* 29*  CREATININE 1.50* 1.38* 1.46* 1.49* 1.56*  1.49*  CALCIUM 9.1 9.3 9.2 9.7 9.6 9.9  AST 43*  --  41 49* 47* 50*  ALT 49*  --  45* 48* 44 45*  ALKPHOS 51  --  54 61 60 62  BILITOT 0.7  --  0.7 1.1 1.0 1.1  ALBUMIN 3.1*  --  3.2* 3.4* 3.3* 3.5  MG 2.4  --  2.5* 2.4  --   --   CRP 0.7  --  0.5 0.5  --   --   DDIMER 1.02*  --  1.70* 2.67*  --   --   PROCALCITON <0.10  --   --   --   --   --   BNP  29.3 23.7 16.9 18.2  --   --     ------------------------------------------------------------------------------------------------------------------ No results for input(s): CHOL, HDL, LDLCALC, TRIG, CHOLHDL, LDLDIRECT in the last 72 hours.  Lab Results  Component Value Date   HGBA1C 6.0 (H) 01/29/2021   ------------------------------------------------------------------------------------------------------------------ No results for input(s): TSH, T4TOTAL, T3FREE, THYROIDAB in the last 72 hours.  Invalid input(s): FREET3  Cardiac Enzymes No results for input(s): CKMB, TROPONINI, MYOGLOBIN in the last 168 hours.  Invalid input(s): CK ------------------------------------------------------------------------------------------------------------------    Component Value Date/Time   BNP 18.2 02/05/2021 0225    Micro Results Recent Results (from the past 240 hour(s))  MRSA PCR Screening     Status: None   Collection Time: 01/30/21  6:08 PM   Specimen: Nasal Mucosa; Nasopharyngeal  Result Value Ref Range Status   MRSA by PCR NEGATIVE NEGATIVE Final    Comment:        The GeneXpert MRSA Assay (FDA approved for NASAL specimens only), is one component of a comprehensive MRSA colonization surveillance program. It is not intended to diagnose MRSA infection nor to guide or monitor treatment for MRSA infections. Performed at [facility], [facility] [address] [place], [place] [address]     Radiology Reports DG Chest [place] 1 View  Result Date: 02/01/2021 CLINICAL DATA:  Shortness of breath.  COVID positive. EXAM: PORTABLE CHEST 1  VIEW COMPARISON:  01/31/2021 FINDINGS: Stable cardiomediastinal contours. Persistent low lung volumes. Mild diffuse increase interstitial markings noted bilaterally. More focal airspace disease is noted within the left lung base. The visualized osseous structures appear intact. IMPRESSION: 1. Stable low lung volumes and increased interstitial markings bilaterally. 2. More focal airspace disease within the left lung base, unchanged from previous exam. Electronically Signed   By: Kerby Moors M.D.   On: 02/01/2021 09:20   DG Chest Port 1 View  Result Date: 01/31/2021 CLINICAL DATA:  70 year old male with a history of shortness of breath and COVID EXAM: PORTABLE CHEST 1 VIEW COMPARISON:  01/29/2021, 01/25/2021 FINDINGS: Cardiomediastinal silhouette unchanged. Low lung volumes persist. Reticulonodular opacities of the lungs, with increasing airspace disease at the left lung base and development of air bronchograms. No pneumothorax. No large pleural effusion. IMPRESSION: Increasing interstitial and airspace disease of the bilateral lungs particularly at the left lung base. Given the air bronchograms, lobar pneumonia/aspiration pneumonia also considered in addition to viral pneumonia. Electronically Signed   By: Corrie Mckusick D.O.   On: 01/31/2021 08:29   DG Chest Port 1 View  Result Date: 01/29/2021 CLINICAL DATA:  Shortness of breath, COVID-19 EXAM: PORTABLE CHEST 1 VIEW COMPARISON:  Portable exam 0820 hours compared to 02/08/2021 FINDINGS: Normal heart size, mediastinal contours, and pulmonary vascularity. Patchy infiltrates in mid to lower LEFT lung and minimally at RIGHT base consistent with multifocal pneumonia. RIGHT lung infiltrate is perhaps slightly improved since previous exam. No pleural effusion or pneumothorax. Bones unremarkable. IMPRESSION: BILATERAL pulmonary infiltrates consistent with multifocal pneumonia, perhaps minimally improved in RIGHT lung. Electronically Signed   By: Lavonia Dana M.D.    On: 01/29/2021 08:34   DG Chest Port 1 View  Result Date: 01/27/2021 CLINICAL DATA:  Cough.  Coronavirus infection. EXAM: PORTABLE CHEST 1 VIEW COMPARISON:  11/24/2017 FINDINGS: Heart size is normal. Chronic aortic atherosclerosis and tortuosity as seen previously. Hazy pulmonary infiltrates in the mid and lower lungs consistent with viral pneumonia. No dense consolidation. Chronic elevation of the left hemidiaphragm. No evidence of effusion. No acute bone finding. IMPRESSION: 1. Hazy pulmonary infiltrates in the  mid and lower lungs consistent with viral pneumonia. No dense consolidation. 2. Aortic atherosclerosis. Electronically Signed   By: Nelson Chimes M.D.   On: 01/24/2021 22:21   VAS [place] LOWER EXTREMITY VENOUS (DVT)  Result Date: 01/31/2021  Lower Venous DVT Study Indications: Covid, d-dimer.  Anticoagulation: Lovenox. Comparison Study: No prior studies. Performing Technologist: Darlin Coco RDMS  Examination Guidelines: A complete evaluation includes B-mode imaging, spectral Doppler, color Doppler, and power Doppler as needed of all accessible portions of each vessel. Bilateral testing is considered an integral part of a complete examination. Limited examinations for reoccurring indications may be performed as noted. The reflux portion of the exam is performed with the patient in reverse Trendelenburg.  +---------+---------------+---------+-----------+----------+-------------------+ RIGHT    CompressibilityPhasicitySpontaneityPropertiesThrombus Aging      +---------+---------------+---------+-----------+----------+-------------------+ CFV      Full           Yes      Yes                                      +---------+---------------+---------+-----------+----------+-------------------+ SFJ      Full                                                             +---------+---------------+---------+-----------+----------+-------------------+ FV Prox  Full                                                              +---------+---------------+---------+-----------+----------+-------------------+ FV Mid   Full                                                             +---------+---------------+---------+-----------+----------+-------------------+ FV DistalFull                                                             +---------+---------------+---------+-----------+----------+-------------------+ PFV      Full                                                             +---------+---------------+---------+-----------+----------+-------------------+ POP      Full           No       Yes                  Rouleaux flow noted +---------+---------------+---------+-----------+----------+-------------------+ PTV      Full                                                             +---------+---------------+---------+-----------+----------+-------------------+  PERO     Full                                                             +---------+---------------+---------+-----------+----------+-------------------+   +---------+---------------+---------+-----------+----------+-------------------+ LEFT     CompressibilityPhasicitySpontaneityPropertiesThrombus Aging      +---------+---------------+---------+-----------+----------+-------------------+ CFV      Full           Yes      Yes                                      +---------+---------------+---------+-----------+----------+-------------------+ SFJ      Full                                                             +---------+---------------+---------+-----------+----------+-------------------+ FV Prox  Full                                                             +---------+---------------+---------+-----------+----------+-------------------+ FV Mid   Full                                                              +---------+---------------+---------+-----------+----------+-------------------+ FV DistalFull                                                             +---------+---------------+---------+-----------+----------+-------------------+ PFV      Full                                                             +---------+---------------+---------+-----------+----------+-------------------+ POP      Full           No       Yes                  Rouleaux flow noted +---------+---------------+---------+-----------+----------+-------------------+ PTV      Full                                                             +---------+---------------+---------+-----------+----------+-------------------+ PERO     Full                                                             +---------+---------------+---------+-----------+----------+-------------------+  Summary: RIGHT: - There is no evidence of deep vein thrombosis in the lower extremity.  - No cystic structure found in the popliteal fossa.  LEFT: - There is no evidence of deep vein thrombosis in the lower extremity.  - No cystic structure found in the popliteal fossa.  *See table(s) above for measurements and observations. Electronically signed by Jamelle Haring on 01/31/2021 at 10:53:05 AM.    Final    ECHOCARDIOGRAM LIMITED  Result Date: 01/30/2021    ECHOCARDIOGRAM LIMITED REPORT   Patient Name:   Kurt Williams Streng Date of Exam: 01/30/2021 Medical Rec #:  [id number]    Height:       72.0 in Accession #:    [id number]   Weight:       237.2 lb Date of Birth:  1951/08/14    BSA:          2.291 m Patient Age:    88 years     BP:           103/65 mmHg Patient Gender: M            HR:           52 bpm. Exam Location:  Inpatient Procedure: Limited Echo, Cardiac Doppler and Color Doppler Indications:    CHF-Acute diastolic  History:        Patient has no prior history of Echocardiogram examinations.                 CAD; Risk Factors:Sleep Apnea.  CKD. GERD.  Sonographer:    Clayton Lefort RDCS (AE) Referring Phys: [id number] Margaree Mackintosh [place]  1. Left ventricular ejection fraction, by estimation, is 60 to 65%. The left ventricle has normal function. The left ventricle has no regional [place] motion abnormalities. There is mild left ventricular hypertrophy. Left ventricular diastolic parameters are consistent with Grade I diastolic dysfunction (impaired relaxation).  2. Right ventricular systolic function is normal. The right ventricular size is normal.  3. The mitral valve is normal in structure. No evidence of mitral valve regurgitation. No evidence of mitral stenosis.  4. The aortic valve is normal in structure. Aortic valve regurgitation is not visualized. No aortic stenosis is present.  5. The inferior vena cava is normal in size with greater than 50% respiratory variability, suggesting right atrial pressure of 3 mmHg. FINDINGS  Left Ventricle: Left ventricular ejection fraction, by estimation, is 60 to 65%. The left ventricle has normal function. The left ventricle has no regional Blackard motion abnormalities. The left ventricular internal cavity size was normal in size. There is  mild left ventricular hypertrophy. Left ventricular diastolic parameters are consistent with Grade I diastolic dysfunction (impaired relaxation). Right Ventricle: The right ventricular size is normal. No increase in right ventricular [place] thickness. Right ventricular systolic function is normal. Left Atrium: Left atrial size was normal in size. Right Atrium: Right atrial size was normal in size. Pericardium: There is no evidence of pericardial effusion. Mitral Valve: The mitral valve is normal in structure. No evidence of mitral valve stenosis. Tricuspid Valve: The tricuspid valve is normal in structure. Tricuspid valve regurgitation is not demonstrated. No evidence of tricuspid stenosis. Aortic Valve: The aortic valve is normal in structure. Aortic valve regurgitation is not  visualized. No aortic stenosis is present. Aortic valve mean gradient measures 5.0 mmHg. Aortic valve peak gradient measures 7.1 mmHg. Aortic valve area, by VTI measures 2.23 cm. Pulmonic Valve: The pulmonic valve was normal in structure. Pulmonic valve regurgitation is not  visualized. No evidence of pulmonic stenosis. Aorta: The aortic root is normal in size and structure. Venous: The inferior vena cava is normal in size with greater than 50% respiratory variability, suggesting right atrial pressure of 3 mmHg. IAS/Shunts: No atrial level shunt detected by color flow Doppler. LEFT VENTRICLE PLAX 2D LVIDd:         4.30 cm  Diastology LVIDs:         2.80 cm  LV e' medial:    5.22 cm/s LV PW:         1.40 cm  LV E/e' medial:  10.7 LV IVS:        1.20 cm  LV e' lateral:   6.85 cm/s LVOT diam:     2.00 cm  LV E/e' lateral: 8.2 LV SV:         69 LV SV Index:   30 LVOT Area:     3.14 cm  IVC IVC diam: 1.80 cm LEFT ATRIUM         Index LA diam:    3.20 cm 1.40 cm/m  AORTIC VALVE AV Area (Vmax):    2.65 cm AV Area (Vmean):   2.20 cm AV Area (VTI):     2.23 cm AV Vmax:           133.00 cm/s AV Vmean:          103.000 cm/s AV VTI:            0.310 m AV Peak Grad:      7.1 mmHg AV Mean Grad:      5.0 mmHg LVOT Vmax:         112.00 cm/s LVOT Vmean:        72.000 cm/s LVOT VTI:          0.220 m LVOT/AV VTI ratio: 0.71  AORTA Ao Root diam: 3.70 cm Ao Asc diam:  3.30 cm MITRAL VALVE MV Area (PHT): 2.44 cm    SHUNTS MV Decel Time: 311 msec    Systemic VTI:  0.22 m MV E velocity: 56.10 cm/s  Systemic Diam: 2.00 cm MV A velocity: 50.60 cm/s MV E/A ratio:  1.11 Candee Furbish MD Electronically signed by Candee Furbish MD Signature Date/Time: 01/30/2021/3:26:43 PM    Final

## 2021-02-08 NOTE — Progress Notes (Signed)
Pt still requiring too much oxygen to wear cpap at this time.  RT will continue to monitor.

## 2021-02-09 DIAGNOSIS — U071 COVID-19: Secondary | ICD-10-CM | POA: Diagnosis not present

## 2021-02-09 DIAGNOSIS — J9601 Acute respiratory failure with hypoxia: Secondary | ICD-10-CM | POA: Diagnosis not present

## 2021-02-09 LAB — COMPREHENSIVE METABOLIC PANEL
ALT: 33 U/L (ref 0–44)
AST: 45 U/L — ABNORMAL HIGH (ref 15–41)
Albumin: 3.3 g/dL — ABNORMAL LOW (ref 3.5–5.0)
Alkaline Phosphatase: 61 U/L (ref 38–126)
Anion gap: 11 (ref 5–15)
BUN: 38 mg/dL — ABNORMAL HIGH (ref 8–23)
CO2: 21 mmol/L — ABNORMAL LOW (ref 22–32)
Calcium: 10.1 mg/dL (ref 8.9–10.3)
Chloride: 100 mmol/L (ref 98–111)
Creatinine, Ser: 1.5 mg/dL — ABNORMAL HIGH (ref 0.61–1.24)
GFR, Estimated: 49 mL/min — ABNORMAL LOW (ref >60)
Glucose, Bld: 96 mg/dL (ref 70–99)
Potassium: 4.8 mmol/L (ref 3.5–5.1)
Sodium: 132 mmol/L — ABNORMAL LOW (ref 135–145)
Total Bilirubin: 1.2 mg/dL (ref 0.3–1.2)
Total Protein: 6.6 g/dL (ref 6.5–8.1)

## 2021-02-09 LAB — CBC
HCT: 39.3 % (ref 39.0–52.0)
Hemoglobin: 13.3 g/dL (ref 13.0–17.0)
MCH: 30.5 pg (ref 26.0–34.0)
MCHC: 33.8 g/dL (ref 30.0–36.0)
MCV: 90.1 fL (ref 80.0–100.0)
Platelets: 213 10*3/uL (ref 150–400)
RBC: 4.36 MIL/uL (ref 4.22–5.81)
RDW: 13.4 % (ref 11.5–15.5)
WBC: 10.1 10*3/uL (ref 4.0–10.5)
nRBC: 0 % (ref 0.0–0.2)

## 2021-02-09 LAB — GLUCOSE, CAPILLARY
Glucose-Capillary: 112 mg/dL — ABNORMAL HIGH (ref 70–99)
Glucose-Capillary: 114 mg/dL — ABNORMAL HIGH (ref 70–99)
Glucose-Capillary: 126 mg/dL — ABNORMAL HIGH (ref 70–99)
Glucose-Capillary: 151 mg/dL — ABNORMAL HIGH (ref 70–99)

## 2021-02-09 MED ORDER — PHENOL 1.4 % MT LIQD
1.0000 | OROMUCOSAL | Status: DC | PRN
  Administered 2021-02-10: 1 via OROMUCOSAL
  Filled 2021-02-09: qty 177

## 2021-02-09 MED ORDER — ALBUTEROL SULFATE HFA 108 (90 BASE) MCG/ACT IN AERS
2.0000 | INHALATION_SPRAY | RESPIRATORY_TRACT | Status: DC | PRN
  Administered 2021-02-10: 2 via RESPIRATORY_TRACT
  Filled 2021-02-09: qty 6.7

## 2021-02-09 NOTE — TOC Progression Note (Signed)
Transition of Care [facility]) - Progression Note    Patient Details  Name: Kurt Williams MRN: [id number] Date of Birth: Aug 10, 1951  Transition of Care [facility]) CM/SW [place], LCSW Phone Number: 02/09/2021, 4:07 PM  Clinical Narrative:    12pm-[employer] started insurance authorization.  3pm-Insurance denial received. MD completed Peer to peer, but still denied due to FiO2 needs.   4pm-CSW updated patient's daughter of the above.      Barriers to Discharge: Continued Medical Work up  Expected Discharge Plan and Services   In-house Referral: Clinical Social Work     Living arrangements for the past 2 months: Single Family Home                                       Social Determinants of Health (SDOH) Interventions    Readmission Risk Interventions No flowsheet data found.

## 2021-02-09 NOTE — Plan of Care (Signed)
  Problem: Education: Goal: Knowledge of General Education information will improve Description: Including pain rating scale, medication(s)/side effects and non-pharmacologic comfort measures Outcome: Progressing   Problem: Health Behavior/Discharge Planning: Goal: Ability to manage health-related needs will improve Outcome: Progressing   Problem: Clinical Measurements: Goal: Will remain free from infection Outcome: Progressing Goal: Respiratory complications will improve Outcome: Progressing   Problem: Activity: Goal: Risk for activity intolerance will decrease Outcome: Progressing   Problem: Nutrition: Goal: Adequate nutrition will be maintained Outcome: Progressing   Problem: Coping: Goal: Level of anxiety will decrease Outcome: Progressing   Problem: Safety: Goal: Ability to remain free from injury will improve Outcome: Progressing   Problem: Education: Goal: Knowledge of risk factors and measures for prevention of condition will improve Outcome: Progressing   Problem: Respiratory: Goal: Will maintain a patent airway Outcome: Progressing Goal: Complications related to the disease process, condition or treatment will be avoided or minimized Outcome: Progressing

## 2021-02-09 NOTE — Progress Notes (Signed)
PROGRESS NOTE                                                                                                                                                                                                             Patient Demographics:    Kurt Williams, is a 70 y.o. male, DOB - [birth year], WVP:[id number]  Outpatient Primary MD for the patient is Shelda Pal, DO    LOS - 11  Admit date - 01/26/2021    Chief Complaint  Patient presents with  . Covid Positive       Brief Narrative (HPI from H&P) - Kurt Williams is a 70 y.o. male with medical history significant of CAD, OSA on CPAP. Pt presents to the ED with c/o SOB and cough ongoing for 5-6 days, he is unvaccinated, he was diagnosed with severe hypoxic Resp failure due to Covid PNA.   Subjective:   No acute issues or events overnight; continues to have profound dyspnea with minimal exertion but improving daily.  Denies nausea vomiting diarrhea constipation headache fevers chills or chest pain..   Assessment  & Plan :     Acute Hypoxic Resp. Failure due to Acute Covid 19 Viral pneumonia ARDS resolving - Unvaccinated with prolonged illness prior to admission - Moderate to severe disease given imaging and symptoms - Continues to improve minimally, likely prolonged hypoxic phase given age and delayed care - Completed Remdesivir/Actemra - Continues on moderate dose Lovenox overall remains extremely tenuous - markedly dyspneic with even minimal exertion - Continue incentive spirometry, flutter, proning, early ambulation  - Continue to wean oxygen aggressively, goal sats 88 to 92% SpO2: 92 % O2 Flow Rate (L/min): 35 L/min FiO2 (%): 80 %  Elevated D-dimer due to COVID-19 and profound inflammation - Continues on moderate dose Lovenox, negative leg ultrasound.  CAD - Chest pain-free currently on aspirin and statin for secondary prevention.  Stable  echocardiogram EF 60%, no WMA. EKG Nonacute.  AKI on questionable CKD3A - Appears to be around baseline at 1.4-1.5 Lab Results  Component Value Date   CREATININE 1.50 (H) 02/09/2021   CREATININE 1.49 (H) 02/06/2021   CREATININE 1.56 (H) 02/06/2021   OSA  - CPAP ordered but incompatible with current oxygen needs, continue to follow with RT      Condition - Guarded Family Communication: Patient to  update family Code Status :  Full Consults  :  None PUD Prophylaxis : PPI   Procedures  :     Echocardiogram - 1. Left ventricular ejection fraction, by estimation, is 60 to 65%. The left ventricle has normal function. The left ventricle has no regional Spada motion abnormalities. There is mild left ventricular hypertrophy. Left ventricular diastolic parameters are consistent with Grade I diastolic dysfunction (impaired relaxation). 2. Right ventricular systolic function is normal. The right ventricular size is normal. 3. The mitral valve is normal in structure. No evidence of mitral valve regurgitation. No evidence of mitral stenosis. 4. The aortic valve is normal in structure. Aortic valve regurgitation is not visualized. No aortic stenosis is present.  5. The inferior vena cava is normal in size with greater than 50% respiratory variability, suggesting right atrial pressure of 3 mmHg.  Leg ultrasound - No DVT      Disposition Plan:  Status is: Inpatient  Remains inpatient appropriate because: IV treatments appropriate due to intensity of illness or inability to take PO  Dispo: The patient is from: Home             Anticipated d/c is to: LTAC             Anticipated d/c date is: 2 days             Patient currently is medically stable to d/c.  Current disposition limited by oxygen needs per discussion with case management.  Difficult to place patient No  DVT Prophylaxis : Lovenox Lab Results  Component Value Date   PLT 213 02/09/2021   Diet :  Diet Order            Diet Heart  Room service appropriate? Yes; Fluid consistency: Thin  Diet effective now                Inpatient Medications Scheduled Meds: . aspirin  81 mg Oral Daily  . enoxaparin (LOVENOX) injection  55 mg Subcutaneous Q24H  . insulin aspart  0-9 Units Subcutaneous TID AC & HS  . melatonin  5 mg Oral QHS  . methylPREDNISolone (SOLU-MEDROL) injection  40 mg Intravenous Q12H  . pantoprazole  40 mg Oral Daily  . rosuvastatin  5 mg Oral Daily  . tamsulosin  0.4 mg Oral Daily   Continuous Infusions:  PRN Meds:.acetaminophen, albuterol, nitroGLYCERIN, [DISCONTINUED] ondansetron **OR** ondansetron (ZOFRAN) IV  Antibiotics : Anti-infectives (From admission, onward)   Start     Dose/Rate Route Frequency Ordered Stop   01/30/21 1800  remdesivir 100 mg in sodium chloride 0.9 % 100 mL IVPB  Status:  Discontinued       "Followed by" Linked Group Details   100 mg 200 mL/hr over 30 Minutes Intravenous Daily 01/25/2021 2311 01/29/21 1041   01/29/21 1600  remdesivir 100 mg in sodium chloride 0.9 % 100 mL IVPB       "Followed by" Linked Group Details   100 mg 200 mL/hr over 30 Minutes Intravenous Daily 01/29/21 1041 02/01/21 1001   01/31/2021 2339  remdesivir 100 mg in sodium chloride 0.9 % 100 mL IVPB       "Followed by" Linked Group Details   100 mg 200 mL/hr over 30 Minutes Intravenous  Once 01/31/2021 2311 01/29/21 0028   01/27/2021 2315  remdesivir 100 mg in sodium chloride 0.9 % 100 mL IVPB       "Followed by" Linked Group Details   100 mg 200 mL/hr over 30 Minutes Intravenous  Once 01/21/2021 2311 01/23/2021 2353     Time Spent in minutes  Alderson DO 02/09/2021 at 2:11 PM  Pager: [place]  [phone] See all Orders from today for further details  Objective:   Vitals:   02/09/21 0851 02/09/21 0927 02/09/21 1156 02/09/21 1347  BP:   98/67   Pulse:   82   Resp:   20   Temp:      TempSrc:      SpO2: 95% 90% 92% 92%  Weight:      Height:        Wt Readings from Last 3 Encounters:  01/29/21 107.6 kg  01/21/2021 108.9 kg  01/09/21 114.8 kg    Intake/Output Summary (Last 24 hours) at 02/09/2021 1411 Last data filed at 02/09/2021 0452 Gross per 24 hour  Intake 240 ml  Output -  Net 240 ml   Physical Exam General:  Pleasantly resting in bed, No acute distress. HEENT:  Normocephalic atraumatic.  Sclerae nonicteric, noninjected.  Extraocular movements intact bilaterally. Neck:  Without mass or deformity.  Trachea is midline. Lungs: Diminished breath sounds bilaterally without overt rhonchi, wheeze, or rales. Heart:  Regular rate and rhythm.  Without murmurs, rubs, or gallops. Abdomen:  Soft, nontender, nondistended.  Without guarding or rebound. Extremities: Without cyanosis, clubbing, edema, or obvious deformity. Vascular:  Dorsalis pedis and posterior tibial pulses palpable bilaterally. Skin:  Warm and dry, no erythema, no ulcerations.    Data Review:   CBC Recent Labs  Lab 02/04/21 0055 02/05/21 0225 02/06/21 0206 02/06/21 0830 02/09/21 0111  WBC 8.7 10.3 10.1 11.7* 10.1  HGB 12.3* 13.4 12.9* 13.6 13.3  HCT 36.1* 41.6 37.8* 42.2 39.3  PLT 312 359 322 308 213  MCV 89.8 91.8 90.2 91.3 90.1  MCH 30.6 29.6 30.8 29.4 30.5  MCHC 34.1 32.2 34.1 32.2 33.8  RDW 13.1 13.2 13.2 13.4 13.4  LYMPHSABS 0.3* 0.4* 0.3*  --   --   MONOABS 0.2 0.2 0.2  --   --   EOSABS 0.0 0.0 0.1  --   --   BASOSABS 0.0 0.0 0.0  --   --     Recent Labs  Lab 02/03/21 0723 02/04/21 0055 02/05/21 0225 02/06/21 0206 02/06/21 0830 02/09/21 0111  NA 134* 133* 134* 132* 133* 132*  K 4.4 4.7 5.1 5.1 4.9 4.8  CL 104 100 99 100 99 100  CO2 22 23 25 23 23  21*  GLUCOSE 111* 141* 128* 119* 105* 96  BUN 27* 28* 26* 29* 29* 38*  CREATININE 1.38* 1.46* 1.49* 1.56* 1.49* 1.50*  CALCIUM 9.3 9.2 9.7 9.6 9.9 10.1  AST  --  41 49* 47* 50* 45*  ALT  --  45* 48* 44 45* 33  ALKPHOS  --  54 61 60 62 61  BILITOT  --  0.7 1.1 1.0 1.1 1.2  ALBUMIN  --   3.2* 3.4* 3.3* 3.5 3.3*  MG  --  2.5* 2.4  --   --   --   CRP  --  0.5 0.5  --   --   --   DDIMER  --  1.70* 2.67*  --   --   --   BNP 23.7 16.9 18.2  --   --   --     ------------------------------------------------------------------------------------------------------------------ No results for input(s): CHOL, HDL, LDLCALC, TRIG, CHOLHDL, LDLDIRECT in the last 72 hours.  Lab Results  Component Value Date  HGBA1C 6.0 (H) 01/29/2021   ------------------------------------------------------------------------------------------------------------------ No results for input(s): TSH, T4TOTAL, T3FREE, THYROIDAB in the last 72 hours.  Invalid input(s): FREET3  Cardiac Enzymes No results for input(s): CKMB, TROPONINI, MYOGLOBIN in the last 168 hours.  Invalid input(s): CK ------------------------------------------------------------------------------------------------------------------    Component Value Date/Time   BNP 18.2 02/05/2021 0225    Micro Results Recent Results (from the past 240 hour(s))  MRSA PCR Screening     Status: None   Collection Time: 01/30/21  6:08 PM   Specimen: Nasal Mucosa; Nasopharyngeal  Result Value Ref Range Status   MRSA by PCR NEGATIVE NEGATIVE Final    Comment:        The GeneXpert MRSA Assay (FDA approved for NASAL specimens only), is one component of a comprehensive MRSA colonization surveillance program. It is not intended to diagnose MRSA infection nor to guide or monitor treatment for MRSA infections. Performed at [facility], [place] [address], [place], [place] [address]     Radiology Reports DG Chest [place] 1 View  Result Date: 02/01/2021 CLINICAL DATA:  Shortness of breath.  COVID positive. EXAM: PORTABLE CHEST 1 VIEW COMPARISON:  01/31/2021 FINDINGS: Stable cardiomediastinal contours. Persistent low lung volumes. Mild diffuse increase interstitial markings noted bilaterally. More focal airspace disease is noted within the left lung  base. The visualized osseous structures appear intact. IMPRESSION: 1. Stable low lung volumes and increased interstitial markings bilaterally. 2. More focal airspace disease within the left lung base, unchanged from previous exam. Electronically Signed   By: Kerby Moors M.D.   On: 02/01/2021 09:20   DG Chest Port 1 View  Result Date: 01/31/2021 CLINICAL DATA:  70 year old male with a history of shortness of breath and COVID EXAM: PORTABLE CHEST 1 VIEW COMPARISON:  01/29/2021, 01/31/2021 FINDINGS: Cardiomediastinal silhouette unchanged. Low lung volumes persist. Reticulonodular opacities of the lungs, with increasing airspace disease at the left lung base and development of air bronchograms. No pneumothorax. No large pleural effusion. IMPRESSION: Increasing interstitial and airspace disease of the bilateral lungs particularly at the left lung base. Given the air bronchograms, lobar pneumonia/aspiration pneumonia also considered in addition to viral pneumonia. Electronically Signed   By: Corrie Mckusick D.O.   On: 01/31/2021 08:29   DG Chest Port 1 View  Result Date: 01/29/2021 CLINICAL DATA:  Shortness of breath, COVID-19 EXAM: PORTABLE CHEST 1 VIEW COMPARISON:  Portable exam 0820 hours compared to 01/24/2021 FINDINGS: Normal heart size, mediastinal contours, and pulmonary vascularity. Patchy infiltrates in mid to lower LEFT lung and minimally at RIGHT base consistent with multifocal pneumonia. RIGHT lung infiltrate is perhaps slightly improved since previous exam. No pleural effusion or pneumothorax. Bones unremarkable. IMPRESSION: BILATERAL pulmonary infiltrates consistent with multifocal pneumonia, perhaps minimally improved in RIGHT lung. Electronically Signed   By: Lavonia Dana M.D.   On: 01/29/2021 08:34   DG Chest Port 1 View  Result Date: 01/24/2021 CLINICAL DATA:  Cough.  Coronavirus infection. EXAM: PORTABLE CHEST 1 VIEW COMPARISON:  11/24/2017 FINDINGS: Heart size is normal. Chronic aortic  atherosclerosis and tortuosity as seen previously. Hazy pulmonary infiltrates in the mid and lower lungs consistent with viral pneumonia. No dense consolidation. Chronic elevation of the left hemidiaphragm. No evidence of effusion. No acute bone finding. IMPRESSION: 1. Hazy pulmonary infiltrates in the mid and lower lungs consistent with viral pneumonia. No dense consolidation. 2. Aortic atherosclerosis. Electronically Signed   By: Nelson Chimes M.D.   On: 01/16/2021 22:21   VAS [place] LOWER EXTREMITY VENOUS (DVT)  Result  Date: 01/31/2021  Lower Venous DVT Study Indications: Covid, d-dimer.  Anticoagulation: Lovenox. Comparison Study: No prior studies. Performing Technologist: Darlin Coco RDMS  Examination Guidelines: A complete evaluation includes B-mode imaging, spectral Doppler, color Doppler, and power Doppler as needed of all accessible portions of each vessel. Bilateral testing is considered an integral part of a complete examination. Limited examinations for reoccurring indications may be performed as noted. The reflux portion of the exam is performed with the patient in reverse Trendelenburg.  +---------+---------------+---------+-----------+----------+-------------------+ RIGHT    CompressibilityPhasicitySpontaneityPropertiesThrombus Aging      +---------+---------------+---------+-----------+----------+-------------------+ CFV      Full           Yes      Yes                                      +---------+---------------+---------+-----------+----------+-------------------+ SFJ      Full                                                             +---------+---------------+---------+-----------+----------+-------------------+ FV Prox  Full                                                             +---------+---------------+---------+-----------+----------+-------------------+ FV Mid   Full                                                              +---------+---------------+---------+-----------+----------+-------------------+ FV DistalFull                                                             +---------+---------------+---------+-----------+----------+-------------------+ PFV      Full                                                             +---------+---------------+---------+-----------+----------+-------------------+ POP      Full           No       Yes                  Rouleaux flow noted +---------+---------------+---------+-----------+----------+-------------------+ PTV      Full                                                             +---------+---------------+---------+-----------+----------+-------------------+  PERO     Full                                                             +---------+---------------+---------+-----------+----------+-------------------+   +---------+---------------+---------+-----------+----------+-------------------+ LEFT     CompressibilityPhasicitySpontaneityPropertiesThrombus Aging      +---------+---------------+---------+-----------+----------+-------------------+ CFV      Full           Yes      Yes                                      +---------+---------------+---------+-----------+----------+-------------------+ SFJ      Full                                                             +---------+---------------+---------+-----------+----------+-------------------+ FV Prox  Full                                                             +---------+---------------+---------+-----------+----------+-------------------+ FV Mid   Full                                                             +---------+---------------+---------+-----------+----------+-------------------+ FV DistalFull                                                             +---------+---------------+---------+-----------+----------+-------------------+  PFV      Full                                                             +---------+---------------+---------+-----------+----------+-------------------+ POP      Full           No       Yes                  Rouleaux flow noted +---------+---------------+---------+-----------+----------+-------------------+ PTV      Full                                                             +---------+---------------+---------+-----------+----------+-------------------+ PERO     Full                                                             +---------+---------------+---------+-----------+----------+-------------------+  Summary: RIGHT: - There is no evidence of deep vein thrombosis in the lower extremity.  - No cystic structure found in the popliteal fossa.  LEFT: - There is no evidence of deep vein thrombosis in the lower extremity.  - No cystic structure found in the popliteal fossa.  *See table(s) above for measurements and observations. Electronically signed by Jamelle Haring on 01/31/2021 at 10:53:05 AM.    Final    ECHOCARDIOGRAM LIMITED  Result Date: 01/30/2021    ECHOCARDIOGRAM LIMITED REPORT   Patient Name:   Kurt Williams Date of Exam: 01/30/2021 Medical Rec #:  [id number]    Height:       72.0 in Accession #:    [id number]   Weight:       237.2 lb Date of Birth:  1951-09-09    BSA:          2.291 m Patient Age:    57 years     BP:           103/65 mmHg Patient Gender: M            HR:           52 bpm. Exam Location:  Inpatient Procedure: Limited Echo, Cardiac Doppler and Color Doppler Indications:    CHF-Acute diastolic  History:        Patient has no prior history of Echocardiogram examinations.                 CAD; Risk Factors:Sleep Apnea. CKD. GERD.  Sonographer:    Clayton Lefort RDCS (AE) Referring Phys: [id number] Margaree Mackintosh [place]  1. Left ventricular ejection fraction, by estimation, is 60 to 65%. The left ventricle has normal function. The left ventricle has no regional  Verhagen motion abnormalities. There is mild left ventricular hypertrophy. Left ventricular diastolic parameters are consistent with Grade I diastolic dysfunction (impaired relaxation).  2. Right ventricular systolic function is normal. The right ventricular size is normal.  3. The mitral valve is normal in structure. No evidence of mitral valve regurgitation. No evidence of mitral stenosis.  4. The aortic valve is normal in structure. Aortic valve regurgitation is not visualized. No aortic stenosis is present.  5. The inferior vena cava is normal in size with greater than 50% respiratory variability, suggesting right atrial pressure of 3 mmHg. FINDINGS  Left Ventricle: Left ventricular ejection fraction, by estimation, is 60 to 65%. The left ventricle has normal function. The left ventricle has no regional Haberkorn motion abnormalities. The left ventricular internal cavity size was normal in size. There is  mild left ventricular hypertrophy. Left ventricular diastolic parameters are consistent with Grade I diastolic dysfunction (impaired relaxation). Right Ventricle: The right ventricular size is normal. No increase in right ventricular Routh thickness. Right ventricular systolic function is normal. Left Atrium: Left atrial size was normal in size. Right Atrium: Right atrial size was normal in size. Pericardium: There is no evidence of pericardial effusion. Mitral Valve: The mitral valve is normal in structure. No evidence of mitral valve stenosis. Tricuspid Valve: The tricuspid valve is normal in structure. Tricuspid valve regurgitation is not demonstrated. No evidence of tricuspid stenosis. Aortic Valve: The aortic valve is normal in structure. Aortic valve regurgitation is not visualized. No aortic stenosis is present. Aortic valve mean gradient measures 5.0 mmHg. Aortic valve peak gradient measures 7.1 mmHg. Aortic valve area, by VTI measures 2.23 cm. Pulmonic Valve: The pulmonic valve was normal in structure. Pulmonic  valve regurgitation is not  visualized. No evidence of pulmonic stenosis. Aorta: The aortic root is normal in size and structure. Venous: The inferior vena cava is normal in size with greater than 50% respiratory variability, suggesting right atrial pressure of 3 mmHg. IAS/Shunts: No atrial level shunt detected by color flow Doppler. LEFT VENTRICLE PLAX 2D LVIDd:         4.30 cm  Diastology LVIDs:         2.80 cm  LV e' medial:    5.22 cm/s LV PW:         1.40 cm  LV E/e' medial:  10.7 LV IVS:        1.20 cm  LV e' lateral:   6.85 cm/s LVOT diam:     2.00 cm  LV E/e' lateral: 8.2 LV SV:         69 LV SV Index:   30 LVOT Area:     3.14 cm  IVC IVC diam: 1.80 cm LEFT ATRIUM         Index LA diam:    3.20 cm 1.40 cm/m  AORTIC VALVE AV Area (Vmax):    2.65 cm AV Area (Vmean):   2.20 cm AV Area (VTI):     2.23 cm AV Vmax:           133.00 cm/s AV Vmean:          103.000 cm/s AV VTI:            0.310 m AV Peak Grad:      7.1 mmHg AV Mean Grad:      5.0 mmHg LVOT Vmax:         112.00 cm/s LVOT Vmean:        72.000 cm/s LVOT VTI:          0.220 m LVOT/AV VTI ratio: 0.71  AORTA Ao Root diam: 3.70 cm Ao Asc diam:  3.30 cm MITRAL VALVE MV Area (PHT): 2.44 cm    SHUNTS MV Decel Time: 311 msec    Systemic VTI:  0.22 m MV E velocity: 56.10 cm/s  Systemic Diam: 2.00 cm MV A velocity: 50.60 cm/s MV E/A ratio:  1.11 Candee Furbish MD Electronically signed by Candee Furbish MD Signature Date/Time: 01/30/2021/3:26:43 PM    Final

## 2021-02-09 NOTE — Progress Notes (Signed)
Occupational Therapy Treatment Patient Details Name: Kurt Williams MRN: [id number] DOB: [birth year] Today's Date: 02/09/2021    History of present illness Pt is a 70 y.o. male admitted 02/04/2021 with SOB and cough; workup for acute hypoxic respiratory failure due to COVID-19 pneumonitis; requiring heated HFNC. No evidence of DVT. PMH includes CAD, OSA on CPAP.   OT comments  Pt seen on 30L HHFNC. Able to complete several sit - stand activities with increased time to rebound after desat into 70s with increased WOB. Assist with LB dressing duevto difficulty with breathing. Educated pt on use of "360 degree breathing" technique to address anxiety associated with feeling SOB, improve rhythm of breathing and decrease RR. Pt able to return demonstrate with 3 count breaths. Continue to recommend rehab at [facility]. Will follow acutely.   Follow Up Recommendations  LTACH    Equipment Recommendations  None recommended by OT    Recommendations for Other Services      Precautions / Restrictions Precautions Precautions: Fall;Other (comment) Precaution Comments: Watch SpO2 on HHFNC       Mobility Bed Mobility               General bed mobility comments: pt sitting in recliner    Transfers Overall transfer level: Needs assistance Equipment used: Rolling walker (2 wheeled)             General transfer comment: RW used to steady once standing    Balance     Sitting balance-Leahy Scale: Good       Standing balance-Leahy Scale: Fair                             ADL either performed or assessed with clinical judgement   ADL                       Lower Body Dressing: Minimal assistance;Sit to/from stand (A for socks today. Difficulty bending over)   Toilet Transfer: Min guard  Pt using urinal. Note increased RR and desat with usage. Encouraged pursed lip breathing with functional tasks. Educated pt on use of visualization during activities to help manage  anxiety.             General ADL Comments: pt with increased fatigue     Vision       Perception     Praxis      Cognition Arousal/Alertness: Awake/alert Behavior During Therapy: WFL for tasks assessed/performed;Flat affect Overall Cognitive Status: Within Functional Limits for tasks assessed                                          Exercises Exercises: Other exercises Other Exercises Other Exercises: flutter x 5; able to pull 500 ml on incnetive spirometer Other Exercises: sit - stand x 3; increased time to recover - @ 5 min in between   Shoulder Instructions       General Comments      Pertinent Vitals/ Pain       Pain Assessment: No/denies pain  Home Living                                          Prior Functioning/Environment  Frequency  Min 2X/week        Progress Toward Goals  OT Goals(current goals can now be found in the care plan section)  Progress towards OT goals: Progressing toward goals  Acute Rehab OT Goals Patient Stated Goal: get better OT Goal Formulation: With patient Time For Goal Achievement: 02/13/21 Potential to Achieve Goals: Good ADL Goals Pt/caregiver will Perform Home Exercise Program: Increased strength;Right Upper extremity;Left upper extremity;Independently;With written HEP provided;With theraband Additional ADL Goal #1: Pt will independently incorporate energy conservation strategies during ADL tasks Additional ADL Goal #2: Pt will complete ADLs mod I with VSS and no more than 3 rest breaks  Plan Discharge plan remains appropriate    Co-evaluation                 AM-PAC OT "6 Clicks" Daily Activity     Outcome Measure   Help from another person eating meals?: None Help from another person taking care of personal grooming?: A Little Help from another person toileting, which includes using toliet, bedpan, or urinal?: A Little Help from another person  bathing (including washing, rinsing, drying)?: A Little Help from another person to put on and taking off regular upper body clothing?: A Little Help from another person to put on and taking off regular lower body clothing?: A Little 6 Click Score: 19    End of Session    OT Visit Diagnosis: Other (comment);Other abnormalities of gait and mobility (R26.89) (decreased activity tolerance)   Activity Tolerance Other (comment) (limited by SOB/desat)   Patient Left in chair;with call bell/phone within reach   Nurse Communication Mobility status        Time: 4536-4680 OT Time Calculation (min): 34 min  Charges: OT General Charges $OT Visit: 1 Visit OT Treatments $Therapeutic Activity: 23-37 mins  Maurie Boettcher, OT/L   Acute OT Clinical Specialist [place] Pager [phone] Office [phone]    Gi Or Norman 02/09/2021, 3:11 PM

## 2021-02-09 NOTE — Care Management Important Message (Signed)
Important Message  Patient Details  Name: Kurt Williams MRN: [id number] Date of Birth: 11/06/1951   Medicare Important Message Given:  Yes - Important Message mailed due to current National Emergency   Verbal consent obtained due to current National Emergency  Relationship to patient: Self Contact Name: Olanrewaju Call Date: 02/09/21  Time: 1323 Phone: [phone] Outcome: Spoke with contact Important Message mailed to: Patient address on file    Delorse Lek 02/09/2021, 1:23 PM

## 2021-02-10 DIAGNOSIS — U071 COVID-19: Secondary | ICD-10-CM | POA: Diagnosis not present

## 2021-02-10 DIAGNOSIS — J9601 Acute respiratory failure with hypoxia: Secondary | ICD-10-CM | POA: Diagnosis not present

## 2021-02-10 LAB — GLUCOSE, CAPILLARY
Glucose-Capillary: 99 mg/dL (ref 70–99)
Glucose-Capillary: 135 mg/dL — ABNORMAL HIGH (ref 70–99)
Glucose-Capillary: 135 mg/dL — ABNORMAL HIGH (ref 70–99)
Glucose-Capillary: 128 mg/dL — ABNORMAL HIGH (ref 70–99)

## 2021-02-10 MED ORDER — ALBUTEROL SULFATE HFA 108 (90 BASE) MCG/ACT IN AERS
2.0000 | INHALATION_SPRAY | Freq: Three times a day (TID) | RESPIRATORY_TRACT | Status: DC
  Administered 2021-02-11: 2 via RESPIRATORY_TRACT
  Filled 2021-02-10: qty 6.7

## 2021-02-10 NOTE — Progress Notes (Signed)
Physical Therapy Treatment Patient Details Name: Kurt Williams MRN: [id number] DOB: 1950-12-22 Today's Date: 02/10/2021    History of Present Illness Pt is a 70 y.o. male admitted 01/18/2021 with SOB and cough; workup for acute hypoxic respiratory failure due to COVID-19 pneumonitis; requiring HHFNC. No evidence of DVT. PMH includes CAD, OSA on CPAP.   PT Comments    Pt slowly progressing with mobility. Continues to demonstrate fair strength, although limited by tenuous cardiorespiratory status with decreased activity tolerance. Pt remains motivated to participate; pt requires 3-4 minutes of rest to recover from DOE and desaturation with short activity bouts. Pt notes increased fatigue. Will continue to follow acutely.  SpO2 down to 81% on 40L O2 HHFNC at 80% FiO2 + NRB; recovering to 88-94% with seated rest   Follow Up Recommendations  LTACH;Supervision for mobility/OOB     Equipment Recommendations   (defer)    Recommendations for Other Services       Precautions / Restrictions Precautions Precautions: Fall;Other (comment) Precaution Comments: Watch SpO2 on 40 L HHFNC at 80% FiO2 + NRB Restrictions Weight Bearing Restrictions: No    Mobility  Bed Mobility Overal bed mobility: Modified Independent             General bed mobility comments: Use of bed rail, increased time and effort to recover SOB after sitting up    Transfers Overall transfer level: Needs assistance Equipment used: None Transfers: Sit to/from Stand Sit to Stand: Supervision         General transfer comment: 4x stands from EOB without UE support, supervision for safety/lines  Ambulation/Gait Ambulation/Gait assistance: Supervision   Assistive device: None       General Gait Details: Limited by HHFNC/lines and fatigue/DOE/hypoxia, pt performed multiple bouts of standing activity, including side steps at EOB and marching in place   Stairs             Wheelchair Mobility    Modified  Rankin (Stroke Patients Only)       Balance Overall balance assessment: Needs assistance Sitting-balance support: Feet supported;No upper extremity supported Sitting balance-Leahy Scale: Good     Standing balance support: No upper extremity supported;During functional activity Standing balance-Leahy Scale: Fair                              Cognition Arousal/Alertness: Awake/alert Behavior During Therapy: WFL for tasks assessed/performed;Flat affect Overall Cognitive Status: Within Functional Limits for tasks assessed                                        Exercises Other Exercises Other Exercises: 1) Sit<>stand, 2) stand with side steps towards HOB, 3) stand and march x10, 4) stand and march x15    General Comments General comments (skin integrity, edema, etc.): SpO2 down to 81% on 40L O2 HHFNC at 80% FiO2 + NRB; return to 88-94% with 3-4-min seated rest between activity bouts. Pt now with "[facility]-homecare therapy" in his room - which looks to be 'cell rejuvenation therapy' per speaking with rep who encouraged pt to do this in addition to PT/OT/medical tx (as opposed to instead of PT/OT) - pt in agreement.      Pertinent Vitals/Pain Pain Assessment: No/denies pain Pain Intervention(s): Monitored during session    Home Living  Prior Function            PT Goals (current goals can now be found in the care plan section) Progress towards PT goals: Progressing toward goals (slowly)    Frequency    Min 3X/week      PT Plan Current plan remains appropriate    Co-evaluation              AM-PAC PT "6 Clicks" Mobility   Outcome Measure  Help needed turning from your back to your side while in a flat bed without using bedrails?: None Help needed moving from lying on your back to sitting on the side of a flat bed without using bedrails?: None Help needed moving to and from a bed to a chair (including a  wheelchair)?: A Little Help needed standing up from a chair using your arms (e.g., wheelchair or bedside chair)?: A Little Help needed to walk in hospital room?: A Little Help needed climbing 3-5 steps with a railing? : A Lot 6 Click Score: 19    End of Session Equipment Utilized During Treatment: Oxygen Activity Tolerance: Patient tolerated treatment well;Patient limited by fatigue;Treatment limited secondary to medical complications (Comment) Patient left: in bed;with call bell/phone within reach (seated EOB to eat lunch) Nurse Communication: Mobility status PT Visit Diagnosis: Other abnormalities of gait and mobility (R26.89)     Time: 3762-8315 PT Time Calculation (min) (ACUTE ONLY): 39 min  Charges:  $Therapeutic Exercise: 23-37 mins $Therapeutic Activity: 8-22 mins                     Mabeline Caras, PT, DPT Acute Rehabilitation Services  Pager [phone] Office [place] 02/10/2021, 5:30 PM

## 2021-02-10 NOTE — Progress Notes (Signed)
PROGRESS NOTE                                                                                                                                                                                                             Patient Demographics:    Kurt Williams, is a 70 y.o. male, DOB - 1951/08/28, EGB:[id number]  Outpatient Primary MD for the patient is Shelda Pal, DO    LOS - 12  Admit date - 01/17/2021    Chief Complaint  Patient presents with  . Covid Positive       Brief Narrative (HPI from H&P) - Kurt Williams is a 70 y.o. male with medical history significant of CAD, OSA on CPAP. Pt presents to the ED with c/o SOB and cough ongoing for 5-6 days, he is unvaccinated, he was diagnosed with severe hypoxic Resp failure due to Covid PNA.   Subjective:   No acute issues or events overnight; continues to have profound dyspnea with minimal exertion but improving daily.  Denies nausea vomiting diarrhea constipation headache fevers chills or chest pain.   Assessment  & Plan :     Acute Hypoxic Resp. Failure due to Acute Covid 19 Viral pneumonia ARDS resolving - Unvaccinated with prolonged illness prior to admission - Moderate to severe disease given imaging and symptoms - Continues to improve minimally, likely prolonged hypoxic phase given age and delayed care - Completed Remdesivir/Actemra - Continues on moderate dose Lovenox overall remains extremely tenuous - markedly dyspneic with even minimal exertion - Continue incentive spirometry, flutter, proning, early ambulation  - Continue to wean oxygen aggressively, goal sats 88 to 92% SpO2: 96 % O2 Flow Rate (L/min): 40 L/min FiO2 (%): 80 %  Elevated D-dimer due to COVID-19 and profound inflammation - Continues on moderate dose Lovenox, negative leg ultrasound.  CAD - Chest pain-free currently on aspirin and statin for secondary prevention.  Stable echocardiogram EF 60%, no WMA. EKG Nonacute.  AKI on questionable CKD3A -  Appears to be around baseline at 1.4-1.5 Lab Results  Component Value Date   CREATININE 1.50 (H) 02/09/2021   CREATININE 1.49 (H) 02/06/2021   CREATININE 1.56 (H) 02/06/2021   OSA  - CPAP ordered but incompatible with current oxygen needs, continue to follow with RT      Condition - Guarded Family Communication: Patient to update family Code Status :  Full Consults  :  None PUD Prophylaxis : PPI   Procedures  :     Echocardiogram - 1. Left ventricular ejection fraction, by estimation,  is 60 to 65%. The left ventricle has normal function. The left ventricle has no regional Rader motion abnormalities. There is mild left ventricular hypertrophy. Left ventricular diastolic parameters are consistent with Grade I diastolic dysfunction (impaired relaxation). 2. Right ventricular systolic function is normal. The right ventricular size is normal. 3. The mitral valve is normal in structure. No evidence of mitral valve regurgitation. No evidence of mitral stenosis. 4. The aortic valve is normal in structure. Aortic valve regurgitation is not visualized. No aortic stenosis is present.  5. The inferior vena cava is normal in size with greater than 50% respiratory variability, suggesting right atrial pressure of 3 mmHg.  Leg ultrasound - No DVT      Disposition Plan:  Status is: Inpatient  Remains inpatient appropriate because: IV treatments appropriate due to intensity of illness or inability to take PO  Dispo: The patient is from: Home             Anticipated d/c is to: LTAC             Anticipated d/c date is: 2 days             Patient currently is medically stable to d/c.  Current disposition limited by oxygen needs per discussion with case management.  Difficult to place patient No  DVT Prophylaxis : Lovenox Lab Results  Component Value Date   PLT 213 02/09/2021   Diet :  Diet Order            Diet Heart Room service appropriate? Yes; Fluid consistency: Thin  Diet effective now                 Inpatient Medications Scheduled Meds: . aspirin  81 mg Oral Daily  . enoxaparin (LOVENOX) injection  55 mg Subcutaneous Q24H  . insulin aspart  0-9 Units Subcutaneous TID AC & HS  . melatonin  5 mg Oral QHS  . methylPREDNISolone (SOLU-MEDROL) injection  40 mg Intravenous Q12H  . pantoprazole  40 mg Oral Daily  . rosuvastatin  5 mg Oral Daily  . tamsulosin  0.4 mg Oral Daily   Continuous Infusions:  PRN Meds:.acetaminophen, albuterol, nitroGLYCERIN, [DISCONTINUED] ondansetron **OR** ondansetron (ZOFRAN) IV, phenol  Antibiotics : Anti-infectives (From admission, onward)   Start     Dose/Rate Route Frequency Ordered Stop   01/30/21 1800  remdesivir 100 mg in sodium chloride 0.9 % 100 mL IVPB  Status:  Discontinued       "Followed by" Linked Group Details   100 mg 200 mL/hr over 30 Minutes Intravenous Daily 01/26/2021 2311 01/29/21 1041   01/29/21 1600  remdesivir 100 mg in sodium chloride 0.9 % 100 mL IVPB       "Followed by" Linked Group Details   100 mg 200 mL/hr over 30 Minutes Intravenous Daily 01/29/21 1041 02/01/21 1001   01/20/2021 2339  remdesivir 100 mg in sodium chloride 0.9 % 100 mL IVPB       "Followed by" Linked Group Details   100 mg 200 mL/hr over 30 Minutes Intravenous  Once 01/27/2021 2311 01/29/21 0028   01/17/2021 2315  remdesivir 100 mg in sodium chloride 0.9 % 100 mL IVPB       "Followed by" Linked Group Details   100 mg 200 mL/hr over 30 Minutes Intravenous  Once 01/19/2021 2311 02/08/2021 2353     Time Spent in minutes  [place] DO 02/10/2021 at 2:57 PM Pager: [facility] -  Office  [phone] See all Orders from today for further details  Objective:   Vitals:   02/10/21 0833 02/10/21 1200 02/10/21 1427 02/10/21 1431  BP:  105/76    Pulse:      Resp:  (!) 24    Temp:  (!) 97.4 F (36.3 C)    TempSrc:  Oral    SpO2: 91% 92% 95% 96%  Weight:      Height:       Wt Readings from Last 3 Encounters:   01/29/21 107.6 kg  01/14/2021 108.9 kg  01/09/21 114.8 kg    Intake/Output Summary (Last 24 hours) at 02/10/2021 1457 Last data filed at 02/10/2021 0900 Gross per 24 hour  Intake 240 ml  Output 750 ml  Net -510 ml   Physical Exam General:  Pleasantly resting in bed, No acute distress. HEENT:  Normocephalic atraumatic.  Sclerae nonicteric, noninjected.  Extraocular movements intact bilaterally. Neck:  Without mass or deformity.  Trachea is midline. Lungs: Diminished breath sounds bilaterally without overt rhonchi, wheeze, or rales. Heart:  Regular rate and rhythm.  Without murmurs, rubs, or gallops. Abdomen:  Soft, nontender, nondistended.  Without guarding or rebound. Extremities: Without cyanosis, clubbing, edema, or obvious deformity. Vascular:  Dorsalis pedis and posterior tibial pulses palpable bilaterally. Skin:  Warm and dry, no erythema, no ulcerations.    Data Review:   CBC Recent Labs  Lab 02/04/21 0055 02/05/21 0225 02/06/21 0206 02/06/21 0830 02/09/21 0111  WBC 8.7 10.3 10.1 11.7* 10.1  HGB 12.3* 13.4 12.9* 13.6 13.3  HCT 36.1* 41.6 37.8* 42.2 39.3  PLT 312 359 322 308 213  MCV 89.8 91.8 90.2 91.3 90.1  MCH 30.6 29.6 30.8 29.4 30.5  MCHC 34.1 32.2 34.1 32.2 33.8  RDW 13.1 13.2 13.2 13.4 13.4  LYMPHSABS 0.3* 0.4* 0.3*  --   --   MONOABS 0.2 0.2 0.2  --   --   EOSABS 0.0 0.0 0.1  --   --   BASOSABS 0.0 0.0 0.0  --   --     Recent Labs  Lab 02/04/21 0055 02/05/21 0225 02/06/21 0206 02/06/21 0830 02/09/21 0111  NA 133* 134* 132* 133* 132*  K 4.7 5.1 5.1 4.9 4.8  CL 100 99 100 99 100  CO2 23 25 23 23  21*  GLUCOSE 141* 128* 119* 105* 96  BUN 28* 26* 29* 29* 38*  CREATININE 1.46* 1.49* 1.56* 1.49* 1.50*  CALCIUM 9.2 9.7 9.6 9.9 10.1  AST 41 49* 47* 50* 45*  ALT 45* 48* 44 45* 33  ALKPHOS 54 61 60 62 61  BILITOT 0.7 1.1 1.0 1.1 1.2  ALBUMIN 3.2* 3.4* 3.3* 3.5 3.3*  MG 2.5* 2.4  --   --   --   CRP 0.5 0.5  --   --   --   DDIMER 1.70* 2.67*  --   --    --   BNP 16.9 18.2  --   --   --     ------------------------------------------------------------------------------------------------------------------ No results for input(s): CHOL, HDL, LDLCALC, TRIG, CHOLHDL, LDLDIRECT in the last 72 hours.  Lab Results  Component Value Date   HGBA1C 6.0 (H) 01/29/2021   ------------------------------------------------------------------------------------------------------------------ No results for input(s): TSH, T4TOTAL, T3FREE, THYROIDAB in the last 72 hours.  Invalid input(s): FREET3  Cardiac Enzymes No results for input(s): CKMB, TROPONINI, MYOGLOBIN in the last 168 hours.  Invalid input(s): CK ------------------------------------------------------------------------------------------------------------------    Component Value Date/Time   BNP 18.2 02/05/2021 0225    Micro  Results No results found for this or any previous visit (from the past 240 hour(s)).  Radiology Reports DG Chest Port 1 View  Result Date: 02/01/2021 CLINICAL DATA:  Shortness of breath.  COVID positive. EXAM: PORTABLE CHEST 1 VIEW COMPARISON:  01/31/2021 FINDINGS: Stable cardiomediastinal contours. Persistent low lung volumes. Mild diffuse increase interstitial markings noted bilaterally. More focal airspace disease is noted within the left lung base. The visualized osseous structures appear intact. IMPRESSION: 1. Stable low lung volumes and increased interstitial markings bilaterally. 2. More focal airspace disease within the left lung base, unchanged from previous exam. Electronically Signed   By: Kerby Moors M.D.   On: 02/01/2021 09:20   DG Chest Port 1 View  Result Date: 01/31/2021 CLINICAL DATA:  70 year old male with a history of shortness of breath and COVID EXAM: PORTABLE CHEST 1 VIEW COMPARISON:  01/29/2021, 01/22/2021 FINDINGS: Cardiomediastinal silhouette unchanged. Low lung volumes persist. Reticulonodular opacities of the lungs, with increasing airspace  disease at the left lung base and development of air bronchograms. No pneumothorax. No large pleural effusion. IMPRESSION: Increasing interstitial and airspace disease of the bilateral lungs particularly at the left lung base. Given the air bronchograms, lobar pneumonia/aspiration pneumonia also considered in addition to viral pneumonia. Electronically Signed   By: Corrie Mckusick D.O.   On: 01/31/2021 08:29   DG Chest Port 1 View  Result Date: 01/29/2021 CLINICAL DATA:  Shortness of breath, COVID-19 EXAM: PORTABLE CHEST 1 VIEW COMPARISON:  Portable exam 0820 hours compared to 02/02/2021 FINDINGS: Normal heart size, mediastinal contours, and pulmonary vascularity. Patchy infiltrates in mid to lower LEFT lung and minimally at RIGHT base consistent with multifocal pneumonia. RIGHT lung infiltrate is perhaps slightly improved since previous exam. No pleural effusion or pneumothorax. Bones unremarkable. IMPRESSION: BILATERAL pulmonary infiltrates consistent with multifocal pneumonia, perhaps minimally improved in RIGHT lung. Electronically Signed   By: Lavonia Dana M.D.   On: 01/29/2021 08:34   DG Chest Port 1 View  Result Date: 01/20/2021 CLINICAL DATA:  Cough.  Coronavirus infection. EXAM: PORTABLE CHEST 1 VIEW COMPARISON:  11/24/2017 FINDINGS: Heart size is normal. Chronic aortic atherosclerosis and tortuosity as seen previously. Hazy pulmonary infiltrates in the mid and lower lungs consistent with viral pneumonia. No dense consolidation. Chronic elevation of the left hemidiaphragm. No evidence of effusion. No acute bone finding. IMPRESSION: 1. Hazy pulmonary infiltrates in the mid and lower lungs consistent with viral pneumonia. No dense consolidation. 2. Aortic atherosclerosis. Electronically Signed   By: Nelson Chimes M.D.   On: 01/21/2021 22:21   VAS [place] LOWER EXTREMITY VENOUS (DVT)  Result Date: 01/31/2021  Lower Venous DVT Study Indications: Covid, d-dimer.  Anticoagulation: Lovenox. Comparison Study: No  prior studies. Performing Technologist: Darlin Coco RDMS  Examination Guidelines: A complete evaluation includes B-mode imaging, spectral Doppler, color Doppler, and power Doppler as needed of all accessible portions of each vessel. Bilateral testing is considered an integral part of a complete examination. Limited examinations for reoccurring indications may be performed as noted. The reflux portion of the exam is performed with the patient in reverse Trendelenburg.  +---------+---------------+---------+-----------+----------+-------------------+ RIGHT    CompressibilityPhasicitySpontaneityPropertiesThrombus Aging      +---------+---------------+---------+-----------+----------+-------------------+ CFV      Full           Yes      Yes                                      +---------+---------------+---------+-----------+----------+-------------------+  SFJ      Full                                                             +---------+---------------+---------+-----------+----------+-------------------+ FV Prox  Full                                                             +---------+---------------+---------+-----------+----------+-------------------+ FV Mid   Full                                                             +---------+---------------+---------+-----------+----------+-------------------+ FV DistalFull                                                             +---------+---------------+---------+-----------+----------+-------------------+ PFV      Full                                                             +---------+---------------+---------+-----------+----------+-------------------+ POP      Full           No       Yes                  Rouleaux flow noted +---------+---------------+---------+-----------+----------+-------------------+ PTV      Full                                                              +---------+---------------+---------+-----------+----------+-------------------+ PERO     Full                                                             +---------+---------------+---------+-----------+----------+-------------------+   +---------+---------------+---------+-----------+----------+-------------------+ LEFT     CompressibilityPhasicitySpontaneityPropertiesThrombus Aging      +---------+---------------+---------+-----------+----------+-------------------+ CFV      Full           Yes      Yes                                      +---------+---------------+---------+-----------+----------+-------------------+ SFJ      Full                                                             +---------+---------------+---------+-----------+----------+-------------------+  FV Prox  Full                                                             +---------+---------------+---------+-----------+----------+-------------------+ FV Mid   Full                                                             +---------+---------------+---------+-----------+----------+-------------------+ FV DistalFull                                                             +---------+---------------+---------+-----------+----------+-------------------+ PFV      Full                                                             +---------+---------------+---------+-----------+----------+-------------------+ POP      Full           No       Yes                  Rouleaux flow noted +---------+---------------+---------+-----------+----------+-------------------+ PTV      Full                                                             +---------+---------------+---------+-----------+----------+-------------------+ PERO     Full                                                             +---------+---------------+---------+-----------+----------+-------------------+      Summary: RIGHT: - There is no evidence of deep vein thrombosis in the lower extremity.  - No cystic structure found in the popliteal fossa.  LEFT: - There is no evidence of deep vein thrombosis in the lower extremity.  - No cystic structure found in the popliteal fossa.  *See table(s) above for measurements and observations. Electronically signed by Jamelle Haring on 01/31/2021 at 10:53:05 AM.    Final    ECHOCARDIOGRAM LIMITED  Result Date: 01/30/2021    ECHOCARDIOGRAM LIMITED REPORT   Patient Name:   WAYNE WICKLUND Mazzola Date of Exam: 01/30/2021 Medical Rec #:  [id number]    Height:       72.0 in Accession #:    [id number]   Weight:       237.2 lb Date of Birth:  09-Mar-1951    BSA:  2.291 m Patient Age:    55 years     BP:           103/65 mmHg Patient Gender: M            HR:           52 bpm. Exam Location:  Inpatient Procedure: Limited Echo, Cardiac Doppler and Color Doppler Indications:    CHF-Acute diastolic  History:        Patient has no prior history of Echocardiogram examinations.                 CAD; Risk Factors:Sleep Apnea. CKD. GERD.  Sonographer:    Clayton Lefort RDCS (AE) Referring Phys: [id number] Margaree Mackintosh Biggs  1. Left ventricular ejection fraction, by estimation, is 60 to 65%. The left ventricle has normal function. The left ventricle has no regional Muhlbauer motion abnormalities. There is mild left ventricular hypertrophy. Left ventricular diastolic parameters are consistent with Grade I diastolic dysfunction (impaired relaxation).  2. Right ventricular systolic function is normal. The right ventricular size is normal.  3. The mitral valve is normal in structure. No evidence of mitral valve regurgitation. No evidence of mitral stenosis.  4. The aortic valve is normal in structure. Aortic valve regurgitation is not visualized. No aortic stenosis is present.  5. The inferior vena cava is normal in size with greater than 50% respiratory variability, suggesting right atrial pressure of 3 mmHg.  FINDINGS  Left Ventricle: Left ventricular ejection fraction, by estimation, is 60 to 65%. The left ventricle has normal function. The left ventricle has no regional Lyerly motion abnormalities. The left ventricular internal cavity size was normal in size. There is  mild left ventricular hypertrophy. Left ventricular diastolic parameters are consistent with Grade I diastolic dysfunction (impaired relaxation). Right Ventricle: The right ventricular size is normal. No increase in right ventricular Latner thickness. Right ventricular systolic function is normal. Left Atrium: Left atrial size was normal in size. Right Atrium: Right atrial size was normal in size. Pericardium: There is no evidence of pericardial effusion. Mitral Valve: The mitral valve is normal in structure. No evidence of mitral valve stenosis. Tricuspid Valve: The tricuspid valve is normal in structure. Tricuspid valve regurgitation is not demonstrated. No evidence of tricuspid stenosis. Aortic Valve: The aortic valve is normal in structure. Aortic valve regurgitation is not visualized. No aortic stenosis is present. Aortic valve mean gradient measures 5.0 mmHg. Aortic valve peak gradient measures 7.1 mmHg. Aortic valve area, by VTI measures 2.23 cm. Pulmonic Valve: The pulmonic valve was normal in structure. Pulmonic valve regurgitation is not visualized. No evidence of pulmonic stenosis. Aorta: The aortic root is normal in size and structure. Venous: The inferior vena cava is normal in size with greater than 50% respiratory variability, suggesting right atrial pressure of 3 mmHg. IAS/Shunts: No atrial level shunt detected by color flow Doppler. LEFT VENTRICLE PLAX 2D LVIDd:         4.30 cm  Diastology LVIDs:         2.80 cm  LV e' medial:    5.22 cm/s LV PW:         1.40 cm  LV E/e' medial:  10.7 LV IVS:        1.20 cm  LV e' lateral:   6.85 cm/s LVOT diam:     2.00 cm  LV E/e' lateral: 8.2 LV SV:         69 LV SV Index:   30 LVOT  Area:     3.14 cm   IVC IVC diam: 1.80 cm LEFT ATRIUM         Index LA diam:    3.20 cm 1.40 cm/m  AORTIC VALVE AV Area (Vmax):    2.65 cm AV Area (Vmean):   2.20 cm AV Area (VTI):     2.23 cm AV Vmax:           133.00 cm/s AV Vmean:          103.000 cm/s AV VTI:            0.310 m AV Peak Grad:      7.1 mmHg AV Mean Grad:      5.0 mmHg LVOT Vmax:         112.00 cm/s LVOT Vmean:        72.000 cm/s LVOT VTI:          0.220 m LVOT/AV VTI ratio: 0.71  AORTA Ao Root diam: 3.70 cm Ao Asc diam:  3.30 cm MITRAL VALVE MV Area (PHT): 2.44 cm    SHUNTS MV Decel Time: 311 msec    Systemic VTI:  0.22 m MV E velocity: 56.10 cm/s  Systemic Diam: 2.00 cm MV A velocity: 50.60 cm/s MV E/A ratio:  1.11 Candee Furbish MD Electronically signed by Candee Furbish MD Signature Date/Time: 01/30/2021/3:26:43 PM    Final

## 2021-02-10 NOTE — Plan of Care (Signed)
  Problem: Education: Goal: Knowledge of General Education information will improve Description: Including pain rating scale, medication(s)/side effects and non-pharmacologic comfort measures Outcome: Progressing   Problem: Health Behavior/Discharge Planning: Goal: Ability to manage health-related needs will improve Outcome: Progressing   Problem: Clinical Measurements: Goal: Will remain free from infection Outcome: Progressing Goal: Diagnostic test results will improve Outcome: Progressing Goal: Respiratory complications will improve Outcome: Progressing Goal: Cardiovascular complication will be avoided Outcome: Progressing   Problem: Activity: Goal: Risk for activity intolerance will decrease Outcome: Progressing   Problem: Nutrition: Goal: Adequate nutrition will be maintained Outcome: Progressing   Problem: Coping: Goal: Level of anxiety will decrease Outcome: Progressing   Problem: Elimination: Goal: Will not experience complications related to bowel motility Outcome: Progressing   Problem: Safety: Goal: Ability to remain free from injury will improve Outcome: Progressing   Problem: Education: Goal: Knowledge of risk factors and measures for prevention of condition will improve Outcome: Progressing   Problem: Respiratory: Goal: Will maintain a patent airway Outcome: Progressing Goal: Complications related to the disease process, condition or treatment will be avoided or minimized Outcome: Progressing

## 2021-02-10 DEATH — deceased

## 2021-02-11 ENCOUNTER — Inpatient Hospital Stay (HOSPITAL_COMMUNITY): Payer: Medicare Other

## 2021-02-11 DIAGNOSIS — N1831 Chronic kidney disease, stage 3a: Secondary | ICD-10-CM | POA: Diagnosis not present

## 2021-02-11 DIAGNOSIS — J9601 Acute respiratory failure with hypoxia: Secondary | ICD-10-CM | POA: Diagnosis not present

## 2021-02-11 DIAGNOSIS — U071 COVID-19: Secondary | ICD-10-CM | POA: Diagnosis not present

## 2021-02-11 DIAGNOSIS — G4733 Obstructive sleep apnea (adult) (pediatric): Secondary | ICD-10-CM | POA: Diagnosis not present

## 2021-02-11 LAB — COMPREHENSIVE METABOLIC PANEL
ALT: 41 U/L (ref 0–44)
AST: 57 U/L — ABNORMAL HIGH (ref 15–41)
Albumin: 3.6 g/dL (ref 3.5–5.0)
Alkaline Phosphatase: 60 U/L (ref 38–126)
Anion gap: 10 (ref 5–15)
BUN: 41 mg/dL — ABNORMAL HIGH (ref 8–23)
CO2: 22 mmol/L (ref 22–32)
Calcium: 10.2 mg/dL (ref 8.9–10.3)
Chloride: 101 mmol/L (ref 98–111)
Creatinine, Ser: 1.53 mg/dL — ABNORMAL HIGH (ref 0.61–1.24)
GFR, Estimated: 48 mL/min — ABNORMAL LOW (ref >60)
Glucose, Bld: 108 mg/dL — ABNORMAL HIGH (ref 70–99)
Potassium: 5 mmol/L (ref 3.5–5.1)
Sodium: 133 mmol/L — ABNORMAL LOW (ref 135–145)
Total Bilirubin: 1.5 mg/dL — ABNORMAL HIGH (ref 0.3–1.2)
Total Protein: 7.2 g/dL (ref 6.5–8.1)

## 2021-02-11 LAB — CBC
HCT: 43.5 % (ref 39.0–52.0)
Hemoglobin: 14.4 g/dL (ref 13.0–17.0)
MCH: 29.9 pg (ref 26.0–34.0)
MCHC: 33.1 g/dL (ref 30.0–36.0)
MCV: 90.4 fL (ref 80.0–100.0)
Platelets: 159 10*3/uL (ref 150–400)
RBC: 4.81 MIL/uL (ref 4.22–5.81)
RDW: 13.4 % (ref 11.5–15.5)
WBC: 11.5 10*3/uL — ABNORMAL HIGH (ref 4.0–10.5)
nRBC: 0 % (ref 0.0–0.2)

## 2021-02-11 LAB — GLUCOSE, CAPILLARY
Glucose-Capillary: 103 mg/dL — ABNORMAL HIGH (ref 70–99)
Glucose-Capillary: 111 mg/dL — ABNORMAL HIGH (ref 70–99)
Glucose-Capillary: 136 mg/dL — ABNORMAL HIGH (ref 70–99)
Glucose-Capillary: 135 mg/dL — ABNORMAL HIGH (ref 70–99)

## 2021-02-11 LAB — D-DIMER, QUANTITATIVE: D-Dimer, Quant: 8.64 ug/mL-FEU — ABNORMAL HIGH (ref 0.00–0.50)

## 2021-02-11 MED ORDER — POLYETHYLENE GLYCOL 3350 17 G PO PACK
17.0000 g | PACK | Freq: Every day | ORAL | Status: DC | PRN
  Filled 2021-02-11: qty 1

## 2021-02-11 MED ORDER — ENOXAPARIN SODIUM 100 MG/ML ~~LOC~~ SOLN
102.0000 mg | Freq: Two times a day (BID) | SUBCUTANEOUS | Status: DC
  Administered 2021-02-11 – 2021-02-12 (×2): 102 mg via SUBCUTANEOUS
  Filled 2021-02-11 (×2): qty 2

## 2021-02-11 MED ORDER — ENOXAPARIN SODIUM 100 MG/ML ~~LOC~~ SOLN: 1.0000 mg/kg | Freq: Two times a day (BID) | SUBCUTANEOUS | Status: DC

## 2021-02-11 NOTE — Progress Notes (Addendum)
ANTICOAGULATION CONSULT NOTE -  Initial Consult  Pharmacy Consult for Enoxaparin Indication: Empiric VTE Treatment (Elevated D-Dimer)  Allergies  Allergen Reactions  . Bee Venom     Patient Measurements: Height: 6' (182.9 cm) Weight: 101.8 kg (224 lb 6.9 oz) IBW/kg (Calculated) : 77.6  Vital Signs: Temp: 97.8 F (36.6 C) (03/02 1800) Temp Source: Axillary (03/02 1800) BP: 105/80 (03/02 1800) Pulse Rate: 90 (03/02 1800)  Labs: Recent Labs    02/09/21 0111 02/11/21 1221  HGB 13.3 14.4  HCT 39.3 43.5  PLT 213 159  CREATININE 1.50* 1.53*    Estimated Creatinine Clearance: 54.7 mL/min (A) (by C-G formula based on SCr of 1.53 mg/dL (H)).   Medical History: Past Medical History:  Diagnosis Date  . Coronary artery disease   . GERD (gastroesophageal reflux disease)   . Headache(784.0)   . Migraine   . OSA on CPAP     Assessment: 70 yr old man admitted on 01/27/2021 with severe hypoxic respiratory failure due to COVID pneumonia. D-dimer cont to increase, now up to 8.64. Pharmacy is consulted for enoxaparin dosing for empiric treatment of VTE in setting of significantly elevated d-dimer.  Pt has been receiving enoxaparin 0.5 mg/kg (55 mg) SQ daily for VTE prophylaxis since 2/18 (last dose at 0841 AM today).   H/H 14.4/43.5, plt 159; Scr 1.53, TBW CrCl ~63.5 ml/min; today's wt: 101.8 kg  Goal of Therapy:  Empiric treatment for VTE in setting of COVID, elevated d-dimer Monitor platelets by anticoagulation protocol: Yes   Plan:  Enoxaparin 1 mg/kg (102 mg) SQ Q 12 hrs Monitor CBC, SCr Monitor for bleeding  Gillermina Hu, PharmD, BCPS, [facility] Clinical Pharmacist 02/11/2021,7:20 PM

## 2021-02-11 NOTE — Progress Notes (Signed)
   02/11/21 2133  Therapy Vitals  Pulse Rate 95  Resp (!) 25  Patient Position (if appropriate) Sitting  Oxygen Therapy/Pulse Ox  $ Pulse Ox Multi Deter  Yes  O2 Device HFNC;Non-rebreather Mask  O2 Therapy Oxygen humidified  O2 Flow Rate (L/min) 40 L/min  FiO2 (%) 100 %  SpO2 96 %  Pt. ON HFFNC and NRB

## 2021-02-11 NOTE — TOC Progression Note (Signed)
Transition of Care [facility]) - Progression Note    Patient Details  Name: ANTONIOUS OMAHONEY MRN: [id number] Date of Birth: 1951/03/17  Transition of Care [facility]) CM/SW [place], LCSW Phone Number: 02/11/2021, 9:20 AM  Clinical Narrative:    CSW spoke with [employer] regarding an appeal since patient's FiO2 is less per MD. [employer] will contact insurance and get the paperwork needed to sign. CSW updated patient's daughter.     Barriers to Discharge: Continued Medical Work up  Expected Discharge Plan and Services   In-house Referral: Clinical Social Work     Living arrangements for the past 2 months: Single Family Home                                       Social Determinants of Health (SDOH) Interventions    Readmission Risk Interventions No flowsheet data found.

## 2021-02-11 NOTE — Progress Notes (Signed)
Pt's daughter called and updated. Daughter had a lot of questions and concerns, addressed some but insists on speaking with MD herself. Various questions about medications and lab work. Will pass on to day shift RN and charge RN to inform MD to notify family of any changes or additions to medications. Education provided by this Therapist, sports.   2100 Healing blanket treatment started per pt. Will continue to monitor. Pt remains on NRB at this time, ujable to remove due to increased demand and anxiety. O2 requirements currently @ 40L 80% FIO2 with NRB in place.

## 2021-02-11 NOTE — Progress Notes (Signed)
Occupational Therapy Treatment Patient Details Name: Kurt Williams MRN: [id number] DOB: [birth year] Today's Date: 02/11/2021    History of present illness Pt is a 70 y.o. male admitted 01/24/2021 with SOB and cough; workup for acute hypoxic respiratory failure due to COVID-19 pneumonitis; requiring HHFNC. No evidence of DVT. PMH includes CAD, OSA on CPAP.   OT comments  Pt is slowly progressing.  He requires very frequent rest breaks after short bouts of activity. Sp02 78-86% with activity with pt on 40L, 80% Fi02.   Wife present.    Follow Up Recommendations  LTACH    Equipment Recommendations  None recommended by OT    Recommendations for Other Services      Precautions / Restrictions Precautions Precautions: Fall;Other (comment) Precaution Comments: Watch SpO2 on 40 L HHFNCat 80% FiO2 + NRB       Mobility Bed Mobility               General bed mobility comments: sitting up in chair    Transfers Overall transfer level: Needs assistance Equipment used: None Transfers: Sit to/from [employer] Transfers Sit to Stand: Supervision Stand pivot transfers: Min guard            Balance Overall balance assessment: Needs assistance Sitting-balance support: Feet supported;No upper extremity supported Sitting balance-Leahy Scale: Good     Standing balance support: No upper extremity supported;During functional activity Standing balance-Leahy Scale: Fair Standing balance comment: able to maintain static standing with min guard assist                           ADL either performed or assessed with clinical judgement   ADL Overall ADL's : Needs assistance/impaired                         Toilet Transfer: Min guard                   Vision       Perception     Praxis      Cognition Arousal/Alertness: Awake/alert Behavior During Therapy: WFL for tasks assessed/performed;Flat affect Overall Cognitive Status: Within Functional  Limits for tasks assessed                                          Exercises Other Exercises Other Exercises: performed 8 reps horizontal abduction with blue theraband with Sp02 84%. 4-6 min recovery to mid 90s Other Exercises: 4 reps Rt houlder flexion Sp02 83% with 6-6 min recovery Other Exercises: Lt shoulder flexion with blue theraband x 3 reps with Sp02 78% and 6 min recovery Other Exercises: Squats x 4 with Sp02 82% and 5 min recovery Other Exercises: marching in place x 10 seconds with Sp02 86%   Shoulder Instructions       General Comments      Pertinent Vitals/ Pain       Pain Assessment: No/denies pain  Home Living                                          Prior Functioning/Environment              Frequency  Min 2X/week  Progress Toward Goals  OT Goals(current goals can now be found in the care plan section)  Progress towards OT goals: Progressing toward goals (slowly)     Plan Discharge plan remains appropriate    Co-evaluation                 AM-PAC OT "6 Clicks" Daily Activity     Outcome Measure   Help from another person eating meals?: None Help from another person taking care of personal grooming?: A Little Help from another person toileting, which includes using toliet, bedpan, or urinal?: A Little Help from another person bathing (including washing, rinsing, drying)?: A Little Help from another person to put on and taking off regular upper body clothing?: A Little Help from another person to put on and taking off regular lower body clothing?: A Little 6 Click Score: 19    End of Session Equipment Utilized During Treatment: Oxygen  OT Visit Diagnosis: Other (comment);Other abnormalities of gait and mobility (R26.89) (decreased activity tolerance)   Activity Tolerance Patient limited by fatigue   Patient Left in bed;with call bell/phone within reach   Nurse Communication Mobility  status        Time: 6333-5456 OT Time Calculation (min): 55 min  Charges: OT General Charges $OT Visit: 1 Visit OT Treatments $Therapeutic Activity: 23-37 mins $Therapeutic Exercise: 23-37 mins  [name]., OTR/L Acute Rehabilitation Services Pager [phone] Office [place], [address] 02/11/2021, 5:13 PM

## 2021-02-11 NOTE — Progress Notes (Signed)
PROGRESS NOTE                                                                                                                                                                                                             Patient Demographics:    Kurt Williams, is a 70 y.o. male, DOB - 1951/09/20, YBW:[id number]  Outpatient Primary MD for the patient is Shelda Pal, DO   Admit date - 02/06/2021   LOS - 62  Chief Complaint  Patient presents with  . Covid Positive       Brief Narrative: Patient is a 70 y.o. male with PMHx of CAD, OSA on CPAP-presented with severe hypoxic respiratory failure due to COVID-19 pneumonia.   COVID-19 vaccinated status: Unvaccinated  Significant Events: 2/16>> Admit to [facility] for severe hypoxia due to COVID-19 pneumonia  Significant studies: 2/16>>Chest x-ray: Bilateral hazy infiltrates 2/17>> bilateral lower extremity Doppler: No DVT 2/18>> Echo: EF 76-81%, grade 1 diastolic dysfunction  LXBWI-20 medications: Steroids: 2/16>> Remdesivir: 2/16>> 2/20 Actemra: 2/17 x 1  Antibiotics: None  Microbiology data: 2/16 >>blood culture: No growth  Procedures: None  Consults: None  DVT prophylaxis: Prophylactic Lovenox    Subjective:    Kurt Williams today continues to complain of shortness of breath with minimal oxygen.  Remains on heated high flow.   Assessment  & Plan :   Acute Hypoxic Resp Failure due to Covid 19 Viral pneumonia: Continues to have severe hypoxemia-probably in the fibrotic stage of ARDS at this point.  Continue steroids-no signs of volume overload, is -10 L so far.  Continue to attempt to titrate down FiO2.  Fever: afebrile O2 requirements:  SpO2: 91 % O2 Flow Rate (L/min): 40 L/min FiO2 (%): 80 %   COVID-19 Labs: No results for input(s): DDIMER, FERRITIN, LDH, CRP in the last 72 hours.     Component Value Date/Time   BNP 18.2 02/05/2021 0225     No results for input(s): PROCALCITON in the last 168 hours.  Lab Results  Component Value Date   SARSCOV2NAA POSITIVE (A) 02/06/2021    Prone/Incentive Spirometry: encouraged patient to lie prone for 3-4 hours at a time for a total of 16 hours a day, and to encourage incentive spirometry use 3-4/hour.  CAD: No anginal symptoms-continue aspirin/statin  AKI on CKD stage IIIa: Improved-creatinine close to baseline.  Lab Results  Component Value Date   CREATININE 1.50 (H) 02/09/2021   CREATININE 1.49 (H) 02/06/2021   CREATININE 1.56 (H) 02/06/2021   OSA on CPAP: On heated high flow-when oxygen requirements are better-can consider placing back on CPAP.   Obesity: Estimated body mass index is 32.17 kg/m as calculated from the following:   Height as of this encounter: 6' (1.829 m).   Weight as of this encounter: 107.6 kg.    GI prophylaxis: PPI  ABG:    Component Value Date/Time   PHART 7.391 02/08/2009 0417   PCO2ART 37.5 02/08/2009 0417   PO2ART 95.0 02/08/2009 0417   HCO3 22.7 02/08/2009 0417   TCO2 24 02/08/2009 0417   ACIDBASEDEF 2.0 02/08/2009 0417   O2SAT 97.0 02/08/2009 0417    Vent Settings:  FiO2 (%):  [80 %] 80 %  Condition - Extremely Guarded  Family Communication  : Daughter-Ayana-[phone]- updated over the phone on 3/2-requesting that family be updated for every change in medication (unahppy that albuterol inhaler was started-requested that I d/c it)-I conveyed to her that sometimes it is just not possible-and that if MAJOR changes occur-we will definitely contact her, otherwise should be contacted the next day.  Code Status :  Full Code  Diet :  Diet Order            Diet Heart Room service appropriate? Yes; Fluid consistency: Thin  Diet effective now                  Disposition Plan  :   Status is: Inpatient  Remains inpatient appropriate because:Inpatient level of care appropriate due to severity of illness  Dispo:  Patient From:  Home (LTAC)  Planned Disposition: LTACH  Medically stable for discharge: Yes    Barriers to discharge: Hypoxia requiring O2 supplementation/complete 5 days of IV Remdesivir  Antimicorbials  :    Anti-infectives (From admission, onward)   Start     Dose/Rate Route Frequency Ordered Stop   01/30/21 1800  remdesivir 100 mg in sodium chloride 0.9 % 100 mL IVPB  Status:  Discontinued       "Followed by" Linked Group Details   100 mg 200 mL/hr over 30 Minutes Intravenous Daily 01/13/2021 2311 01/29/21 1041   01/29/21 1600  remdesivir 100 mg in sodium chloride 0.9 % 100 mL IVPB       "Followed by" Linked Group Details   100 mg 200 mL/hr over 30 Minutes Intravenous Daily 01/29/21 1041 02/01/21 1001   01/31/2021 2339  remdesivir 100 mg in sodium chloride 0.9 % 100 mL IVPB       "Followed by" Linked Group Details   100 mg 200 mL/hr over 30 Minutes Intravenous  Once 01/15/2021 2311 01/29/21 0028   02/04/2021 2315  remdesivir 100 mg in sodium chloride 0.9 % 100 mL IVPB       "Followed by" Linked Group Details   100 mg 200 mL/hr over 30 Minutes Intravenous  Once 01/27/2021 2311 02/07/2021 2353      Inpatient Medications  Scheduled Meds: . albuterol  2 puff Inhalation TID  . aspirin  81 mg Oral Daily  . enoxaparin (LOVENOX) injection  55 mg Subcutaneous Q24H  . insulin aspart  0-9 Units Subcutaneous TID AC & HS  . melatonin  5 mg Oral QHS  . methylPREDNISolone (SOLU-MEDROL) injection  40 mg Intravenous Q12H  . pantoprazole  40 mg Oral Daily  . rosuvastatin  5 mg Oral Daily  . tamsulosin  0.4  mg Oral Daily   Continuous Infusions: PRN Meds:.acetaminophen, nitroGLYCERIN, [DISCONTINUED] ondansetron **OR** ondansetron (ZOFRAN) IV, phenol   Time Spent in minutes  25   See all Orders from today for further details   Oren Binet M.D on 02/11/2021 at 11:19 AM  To page go to [email or web address] - use universal password  [facility] -  Office  [phone]    Objective:   Vitals:    02/11/21 0257 02/11/21 0506 02/11/21 0754 02/11/21 0848  BP:  101/66 102/78   Pulse: 74 95 69   Resp: 18 (!) 25 (!) 24   Temp:  (!) 97.5 F (36.4 C) 98.3 F (36.8 C)   TempSrc:  Oral Axillary   SpO2: 97% 90% 95% 91%  Weight:      Height:        Wt Readings from Last 3 Encounters:  01/29/21 107.6 kg  01/27/2021 108.9 kg  01/09/21 114.8 kg     Intake/Output Summary (Last 24 hours) at 02/11/2021 1119 Last data filed at 02/11/2021 0102 Gross per 24 hour  Intake 960 ml  Output 1050 ml  Net -90 ml     Physical Exam Gen Exam:Alert awake-not in any distress HEENT:atraumatic, normocephalic Chest: B/L clear to auscultation anteriorly CVS:S1S2 regular Abdomen:soft non tender, non distended Extremities:no edema Neurology: Non focal Skin: no rash   Data Review:    CBC Recent Labs  Lab 02/05/21 0225 02/06/21 0206 02/06/21 0830 02/09/21 0111  WBC 10.3 10.1 11.7* 10.1  HGB 13.4 12.9* 13.6 13.3  HCT 41.6 37.8* 42.2 39.3  PLT 359 322 308 213  MCV 91.8 90.2 91.3 90.1  MCH 29.6 30.8 29.4 30.5  MCHC 32.2 34.1 32.2 33.8  RDW 13.2 13.2 13.4 13.4  LYMPHSABS 0.4* 0.3*  --   --   MONOABS 0.2 0.2  --   --   EOSABS 0.0 0.1  --   --   BASOSABS 0.0 0.0  --   --     Chemistries  Recent Labs  Lab 02/05/21 0225 02/06/21 0206 02/06/21 0830 02/09/21 0111  NA 134* 132* 133* 132*  K 5.1 5.1 4.9 4.8  CL 99 100 99 100  CO2 25 23 23  21*  GLUCOSE 128* 119* 105* 96  BUN 26* 29* 29* 38*  CREATININE 1.49* 1.56* 1.49* 1.50*  CALCIUM 9.7 9.6 9.9 10.1  MG 2.4  --   --   --   AST 49* 47* 50* 45*  ALT 48* 44 45* 33  ALKPHOS 61 60 62 61  BILITOT 1.1 1.0 1.1 1.2   ------------------------------------------------------------------------------------------------------------------ No results for input(s): CHOL, HDL, LDLCALC, TRIG, CHOLHDL, LDLDIRECT in the last 72 hours.  Lab Results  Component Value Date   HGBA1C 6.0 (H) 01/29/2021    ------------------------------------------------------------------------------------------------------------------ No results for input(s): TSH, T4TOTAL, T3FREE, THYROIDAB in the last 72 hours.  Invalid input(s): FREET3 ------------------------------------------------------------------------------------------------------------------ No results for input(s): VITAMINB12, FOLATE, FERRITIN, TIBC, IRON, RETICCTPCT in the last 72 hours.  Coagulation profile No results for input(s): INR, PROTIME in the last 168 hours.  No results for input(s): DDIMER in the last 72 hours.  Cardiac Enzymes No results for input(s): CKMB, TROPONINI, MYOGLOBIN in the last 168 hours.  Invalid input(s): CK ------------------------------------------------------------------------------------------------------------------    Component Value Date/Time   BNP 18.2 02/05/2021 0225    Micro Results No results found for this or any previous visit (from the past 240 hour(s)).  Radiology Reports DG Chest Port 1 View  Result Date: 02/01/2021 CLINICAL DATA:  Shortness of breath.  COVID positive. EXAM: PORTABLE CHEST 1 VIEW COMPARISON:  01/31/2021 FINDINGS: Stable cardiomediastinal contours. Persistent low lung volumes. Mild diffuse increase interstitial markings noted bilaterally. More focal airspace disease is noted within the left lung base. The visualized osseous structures appear intact. IMPRESSION: 1. Stable low lung volumes and increased interstitial markings bilaterally. 2. More focal airspace disease within the left lung base, unchanged from previous exam. Electronically Signed   By: Kerby Moors M.D.   On: 02/01/2021 09:20   DG Chest Port 1 View  Result Date: 01/31/2021 CLINICAL DATA:  71 year old male with a history of shortness of breath and COVID EXAM: PORTABLE CHEST 1 VIEW COMPARISON:  01/29/2021, 01/21/2021 FINDINGS: Cardiomediastinal silhouette unchanged. Low lung volumes persist. Reticulonodular opacities  of the lungs, with increasing airspace disease at the left lung base and development of air bronchograms. No pneumothorax. No large pleural effusion. IMPRESSION: Increasing interstitial and airspace disease of the bilateral lungs particularly at the left lung base. Given the air bronchograms, lobar pneumonia/aspiration pneumonia also considered in addition to viral pneumonia. Electronically Signed   By: Corrie Mckusick D.O.   On: 01/31/2021 08:29   DG Chest Port 1 View  Result Date: 01/29/2021 CLINICAL DATA:  Shortness of breath, COVID-19 EXAM: PORTABLE CHEST 1 VIEW COMPARISON:  Portable exam 0820 hours compared to 01/17/2021 FINDINGS: Normal heart size, mediastinal contours, and pulmonary vascularity. Patchy infiltrates in mid to lower LEFT lung and minimally at RIGHT base consistent with multifocal pneumonia. RIGHT lung infiltrate is perhaps slightly improved since previous exam. No pleural effusion or pneumothorax. Bones unremarkable. IMPRESSION: BILATERAL pulmonary infiltrates consistent with multifocal pneumonia, perhaps minimally improved in RIGHT lung. Electronically Signed   By: Lavonia Dana M.D.   On: 01/29/2021 08:34   DG Chest Port 1 View  Result Date: 01/23/2021 CLINICAL DATA:  Cough.  Coronavirus infection. EXAM: PORTABLE CHEST 1 VIEW COMPARISON:  11/24/2017 FINDINGS: Heart size is normal. Chronic aortic atherosclerosis and tortuosity as seen previously. Hazy pulmonary infiltrates in the mid and lower lungs consistent with viral pneumonia. No dense consolidation. Chronic elevation of the left hemidiaphragm. No evidence of effusion. No acute bone finding. IMPRESSION: 1. Hazy pulmonary infiltrates in the mid and lower lungs consistent with viral pneumonia. No dense consolidation. 2. Aortic atherosclerosis. Electronically Signed   By: Nelson Chimes M.D.   On: 02/06/2021 22:21   VAS [place] LOWER EXTREMITY VENOUS (DVT)  Result Date: 01/31/2021  Lower Venous DVT Study Indications: Covid, d-dimer.   Anticoagulation: Lovenox. Comparison Study: No prior studies. Performing Technologist: Darlin Coco RDMS  Examination Guidelines: A complete evaluation includes B-mode imaging, spectral Doppler, color Doppler, and power Doppler as needed of all accessible portions of each vessel. Bilateral testing is considered an integral part of a complete examination. Limited examinations for reoccurring indications may be performed as noted. The reflux portion of the exam is performed with the patient in reverse Trendelenburg.  +---------+---------------+---------+-----------+----------+-------------------+ RIGHT    CompressibilityPhasicitySpontaneityPropertiesThrombus Aging      +---------+---------------+---------+-----------+----------+-------------------+ CFV      Full           Yes      Yes                                      +---------+---------------+---------+-----------+----------+-------------------+ SFJ      Full                                                             +---------+---------------+---------+-----------+----------+-------------------+  FV Prox  Full                                                             +---------+---------------+---------+-----------+----------+-------------------+ FV Mid   Full                                                             +---------+---------------+---------+-----------+----------+-------------------+ FV DistalFull                                                             +---------+---------------+---------+-----------+----------+-------------------+ PFV      Full                                                             +---------+---------------+---------+-----------+----------+-------------------+ POP      Full           No       Yes                  Rouleaux flow noted +---------+---------------+---------+-----------+----------+-------------------+ PTV      Full                                                              +---------+---------------+---------+-----------+----------+-------------------+ PERO     Full                                                             +---------+---------------+---------+-----------+----------+-------------------+   +---------+---------------+---------+-----------+----------+-------------------+ LEFT     CompressibilityPhasicitySpontaneityPropertiesThrombus Aging      +---------+---------------+---------+-----------+----------+-------------------+ CFV      Full           Yes      Yes                                      +---------+---------------+---------+-----------+----------+-------------------+ SFJ      Full                                                             +---------+---------------+---------+-----------+----------+-------------------+ FV Prox  Full                                                             +---------+---------------+---------+-----------+----------+-------------------+  FV Mid   Full                                                             +---------+---------------+---------+-----------+----------+-------------------+ FV DistalFull                                                             +---------+---------------+---------+-----------+----------+-------------------+ PFV      Full                                                             +---------+---------------+---------+-----------+----------+-------------------+ POP      Full           No       Yes                  Rouleaux flow noted +---------+---------------+---------+-----------+----------+-------------------+ PTV      Full                                                             +---------+---------------+---------+-----------+----------+-------------------+ PERO     Full                                                              +---------+---------------+---------+-----------+----------+-------------------+     Summary: RIGHT: - There is no evidence of deep vein thrombosis in the lower extremity.  - No cystic structure found in the popliteal fossa.  LEFT: - There is no evidence of deep vein thrombosis in the lower extremity.  - No cystic structure found in the popliteal fossa.  *See table(s) above for measurements and observations. Electronically signed by Jamelle Haring on 01/31/2021 at 10:53:05 AM.    Final    ECHOCARDIOGRAM LIMITED  Result Date: 01/30/2021    ECHOCARDIOGRAM LIMITED REPORT   Patient Name:   MUHAMAD SERANO Goldner Date of Exam: 01/30/2021 Medical Rec #:  [id number]    Height:       72.0 in Accession #:    [id number]   Weight:       237.2 lb Date of Birth:  [birth year]    BSA:          2.291 m Patient Age:    74 years     BP:           103/65 mmHg Patient Gender: M            HR:           52 bpm. Exam Location:  Inpatient Procedure: Limited Echo, Cardiac Doppler and Color Doppler  Indications:    CHF-Acute diastolic  History:        Patient has no prior history of Echocardiogram examinations.                 CAD; Risk Factors:Sleep Apnea. CKD. GERD.  Sonographer:    Clayton Lefort RDCS (AE) Referring Phys: [id number] [place]  1. Left ventricular ejection fraction, by estimation, is 60 to 65%. The left ventricle has normal function. The left ventricle has no regional Howden motion abnormalities. There is mild left ventricular hypertrophy. Left ventricular diastolic parameters are consistent with Grade I diastolic dysfunction (impaired relaxation).  2. Right ventricular systolic function is normal. The right ventricular size is normal.  3. The mitral valve is normal in structure. No evidence of mitral valve regurgitation. No evidence of mitral stenosis.  4. The aortic valve is normal in structure. Aortic valve regurgitation is not visualized. No aortic stenosis is present.  5. The inferior vena cava is normal in size with  greater than 50% respiratory variability, suggesting right atrial pressure of 3 mmHg. FINDINGS  Left Ventricle: Left ventricular ejection fraction, by estimation, is 60 to 65%. The left ventricle has normal function. The left ventricle has no regional Meiklejohn motion abnormalities. The left ventricular internal cavity size was normal in size. There is  mild left ventricular hypertrophy. Left ventricular diastolic parameters are consistent with Grade I diastolic dysfunction (impaired relaxation). Right Ventricle: The right ventricular size is normal. No increase in right ventricular Tesar thickness. Right ventricular systolic function is normal. Left Atrium: Left atrial size was normal in size. Right Atrium: Right atrial size was normal in size. Pericardium: There is no evidence of pericardial effusion. Mitral Valve: The mitral valve is normal in structure. No evidence of mitral valve stenosis. Tricuspid Valve: The tricuspid valve is normal in structure. Tricuspid valve regurgitation is not demonstrated. No evidence of tricuspid stenosis. Aortic Valve: The aortic valve is normal in structure. Aortic valve regurgitation is not visualized. No aortic stenosis is present. Aortic valve mean gradient measures 5.0 mmHg. Aortic valve peak gradient measures 7.1 mmHg. Aortic valve area, by VTI measures 2.23 cm. Pulmonic Valve: The pulmonic valve was normal in structure. Pulmonic valve regurgitation is not visualized. No evidence of pulmonic stenosis. Aorta: The aortic root is normal in size and structure. Venous: The inferior vena cava is normal in size with greater than 50% respiratory variability, suggesting right atrial pressure of 3 mmHg. IAS/Shunts: No atrial level shunt detected by color flow Doppler. LEFT VENTRICLE PLAX 2D LVIDd:         4.30 cm  Diastology LVIDs:         2.80 cm  LV e' medial:    5.22 cm/s LV PW:         1.40 cm  LV E/e' medial:  10.7 LV IVS:        1.20 cm  LV e' lateral:   6.85 cm/s LVOT diam:     2.00  cm  LV E/e' lateral: 8.2 LV SV:         69 LV SV Index:   30 LVOT Area:     3.14 cm  IVC IVC diam: 1.80 cm LEFT ATRIUM         Index LA diam:    3.20 cm 1.40 cm/m  AORTIC VALVE AV Area (Vmax):    2.65 cm AV Area (Vmean):   2.20 cm AV Area (VTI):     2.23 cm AV Vmax:  133.00 cm/s AV Vmean:          103.000 cm/s AV VTI:            0.310 m AV Peak Grad:      7.1 mmHg AV Mean Grad:      5.0 mmHg LVOT Vmax:         112.00 cm/s LVOT Vmean:        72.000 cm/s LVOT VTI:          0.220 m LVOT/AV VTI ratio: 0.71  AORTA Ao Root diam: 3.70 cm Ao Asc diam:  3.30 cm MITRAL VALVE MV Area (PHT): 2.44 cm    SHUNTS MV Decel Time: 311 msec    Systemic VTI:  0.22 m MV E velocity: 56.10 cm/s  Systemic Diam: 2.00 cm MV A velocity: 50.60 cm/s MV E/A ratio:  1.11 Candee Furbish MD Electronically signed by Candee Furbish MD Signature Date/Time: 01/30/2021/3:26:43 PM    Final

## 2021-02-11 NOTE — TOC Progression Note (Addendum)
Transition of Care [facility]) - Progression Note    Patient Details  Name: TERIN CRAGLE MRN: [id number] Date of Birth: 1951-08-02  [facility] CM/SW [place], LCSW Phone Number: 02/11/2021, 9:22 AM  Clinical Narrative:    9:22am-CSW received call from [employer]. They reported that per the insurance company, they require patient's FiO2 to be below 50% (though that is not [employer]'s standard). CSW notes patient's last documented was 80%. CSW will continue to follow until patient can be on less FiO2 and then [employer] will re-submit. CSW left voicemail for patient's daughter to update her.   9:33am-Daughter returned call. CSW provided update.      Barriers to Discharge: Continued Medical Work up  Expected Discharge Plan and Services   In-house Referral: Clinical Social Work     Living arrangements for the past 2 months: Single Family Home                                       Social Determinants of Health (SDOH) Interventions    Readmission Risk Interventions No flowsheet data found.

## 2021-02-12 ENCOUNTER — Inpatient Hospital Stay (HOSPITAL_COMMUNITY): Payer: Medicare Other

## 2021-02-12 DIAGNOSIS — N1831 Chronic kidney disease, stage 3a: Secondary | ICD-10-CM | POA: Diagnosis not present

## 2021-02-12 DIAGNOSIS — J9601 Acute respiratory failure with hypoxia: Secondary | ICD-10-CM | POA: Diagnosis not present

## 2021-02-12 DIAGNOSIS — G4733 Obstructive sleep apnea (adult) (pediatric): Secondary | ICD-10-CM | POA: Diagnosis not present

## 2021-02-12 DIAGNOSIS — U071 COVID-19: Secondary | ICD-10-CM | POA: Diagnosis not present

## 2021-02-12 LAB — GLUCOSE, CAPILLARY
Glucose-Capillary: 109 mg/dL — ABNORMAL HIGH (ref 70–99)
Glucose-Capillary: 117 mg/dL — ABNORMAL HIGH (ref 70–99)
Glucose-Capillary: 125 mg/dL — ABNORMAL HIGH (ref 70–99)
Glucose-Capillary: 124 mg/dL — ABNORMAL HIGH (ref 70–99)

## 2021-02-12 LAB — COMPREHENSIVE METABOLIC PANEL
ALT: 40 U/L (ref 0–44)
AST: 58 U/L — ABNORMAL HIGH (ref 15–41)
Albumin: 3.6 g/dL (ref 3.5–5.0)
Alkaline Phosphatase: 56 U/L (ref 38–126)
Anion gap: 9 (ref 5–15)
BUN: 40 mg/dL — ABNORMAL HIGH (ref 8–23)
CO2: 21 mmol/L — ABNORMAL LOW (ref 22–32)
Calcium: 10.5 mg/dL — ABNORMAL HIGH (ref 8.9–10.3)
Chloride: 100 mmol/L (ref 98–111)
Creatinine, Ser: 1.63 mg/dL — ABNORMAL HIGH (ref 0.61–1.24)
GFR, Estimated: 45 mL/min — ABNORMAL LOW (ref >60)
Glucose, Bld: 118 mg/dL — ABNORMAL HIGH (ref 70–99)
Potassium: 5.4 mmol/L — ABNORMAL HIGH (ref 3.5–5.1)
Sodium: 130 mmol/L — ABNORMAL LOW (ref 135–145)
Total Bilirubin: 1.8 mg/dL — ABNORMAL HIGH (ref 0.3–1.2)
Total Protein: 7.1 g/dL (ref 6.5–8.1)

## 2021-02-12 LAB — CBC
HCT: 42.2 % (ref 39.0–52.0)
Hemoglobin: 14.4 g/dL (ref 13.0–17.0)
MCH: 30.4 pg (ref 26.0–34.0)
MCHC: 34.1 g/dL (ref 30.0–36.0)
MCV: 89 fL (ref 80.0–100.0)
Platelets: 166 10*3/uL (ref 150–400)
RBC: 4.74 MIL/uL (ref 4.22–5.81)
RDW: 13.5 % (ref 11.5–15.5)
WBC: 11.2 10*3/uL — ABNORMAL HIGH (ref 4.0–10.5)
nRBC: 0 % (ref 0.0–0.2)

## 2021-02-12 LAB — D-DIMER, QUANTITATIVE: D-Dimer, Quant: 7.46 ug/mL-FEU — ABNORMAL HIGH (ref 0.00–0.50)

## 2021-02-12 MED ORDER — SODIUM CHLORIDE 0.9 % IV SOLN: INTRAVENOUS | Status: AC

## 2021-02-12 MED ORDER — SODIUM ZIRCONIUM CYCLOSILICATE 10 G PO PACK
10.0000 g | PACK | Freq: Two times a day (BID) | ORAL | Status: AC
  Administered 2021-02-12: 10 g via ORAL
  Filled 2021-02-12 (×2): qty 1

## 2021-02-12 MED ORDER — ENOXAPARIN SODIUM 100 MG/ML ~~LOC~~ SOLN
100.0000 mg | Freq: Two times a day (BID) | SUBCUTANEOUS | Status: DC
  Administered 2021-02-12 – 2021-02-14 (×4): 100 mg via SUBCUTANEOUS
  Filled 2021-02-12 (×5): qty 1

## 2021-02-12 NOTE — Progress Notes (Addendum)
PROGRESS NOTE                                                                                                                                                                                                             Patient Demographics:    Kurt Williams, is a 70 y.o. male, DOB - 1951-03-29, VZD:[id number]  Outpatient Primary MD for the patient is Shelda Pal, DO   Admit date - 01/22/2021   LOS - 55  Chief Complaint  Patient presents with  . Covid Positive       Brief Narrative: Patient is a 70 y.o. male with PMHx of CAD, OSA on CPAP-presented with severe hypoxic respiratory failure due to COVID-19 pneumonia.   COVID-19 vaccinated status: Unvaccinated  Significant Events: 2/16>> Admit to [facility] for severe hypoxia due to COVID-19 pneumonia  Significant studies: 2/16>>Chest x-ray: Bilateral hazy infiltrates 2/17>> bilateral lower extremity Doppler: No DVT 2/18>> Echo: EF 29-51%, grade 1 diastolic dysfunction  [id number]-[id number] medications: Steroids: 2/16>> Remdesivir: 2/16>> 2/20 Actemra: 2/17 x 1  Antibiotics: None  Microbiology data: 2/16 >>blood culture: No growth  Procedures: None  Consults: None  DVT prophylaxis: Therapeutic Lovenox    Subjective:   Still remains very short of breath with minimal ambulation-his O2 saturations went down to the 70s just getting out of bed.   Assessment  & Plan :   Acute Hypoxic Resp Failure due to Covid 19 Viral pneumonia: Continues to have severe hypoxemia-on heated high flow-continues to require NRB in addition to heated high flow.  No signs of volume overload-continue steroids-continue to attempt to slowly titrate down FiO2.    Fever: afebrile O2 requirements:  SpO2: 90 % O2 Flow Rate (L/min): 35 L/min FiO2 (%): 100 %   COVID-19 Labs: Recent Labs    02/11/21 1221 02/12/21 0118  DDIMER 8.64* 7.46*       Component Value Date/Time   BNP 18.2  02/05/2021 0225    No results for input(s): PROCALCITON in the last 168 hours.  Lab Results  Component Value Date   SARSCOV2NAA POSITIVE (A) 01/24/2021    Prone/Incentive Spirometry: encouraged patient to lie prone for 3-4 hours at a time for a total of 16 hours a day, and to encourage incentive spirometry use 3-4/hour.  Elevated D-dimer: Likely related to COVID-19 related inflammation-given COVID-19-significant risk for VTE-repeat lower extremity  Doppler-have ordered a VQ scan-have changed to therapeutic Lovenox.  CAD: No anginal symptoms-continue aspirin/statin  Hyperkalemia: Lokelma x2 doses today-repeat electrolytes tomorrow.  AKI on CKD stage IIIa: creatinine stable-close to baseline.  Lab Results  Component Value Date   CREATININE 1.63 (H) 02/12/2021   CREATININE 1.53 (H) 02/11/2021   CREATININE 1.50 (H) 02/09/2021   OSA on CPAP: On heated high flow-when oxygen requirements are better-can consider placing back on CPAP.   Obesity: Estimated body mass index is 30.44 kg/m as calculated from the following:   Height as of this encounter: 6' (1.829 m).   Weight as of this encounter: 101.8 kg.    GI prophylaxis: PPI  ABG:    Component Value Date/Time   PHART 7.391 02/08/2009 0417   PCO2ART 37.5 02/08/2009 0417   PO2ART 95.0 02/08/2009 0417   HCO3 22.7 02/08/2009 0417   TCO2 24 02/08/2009 0417   ACIDBASEDEF 2.0 02/08/2009 0417   O2SAT 97.0 02/08/2009 0417    Vent Settings:  FiO2 (%):  [60 %-100 %] 100 %  Condition - Extremely Guarded  Family Communication  : Daughter-[phone]- updated over the phone on 3/3-at patients request. She is aware of the plans to do Doppler/VQ  Note-Daughter-upset that she was not aware of change in Lovenox dosing-she is upset that the RN did not update her when she talked to her yesterday regarding the change in medication.  I again explained to her that certain medication changes will not be able to be communicated to the  family right away in real-time-and that when I talk to her once a day-I will update her in regards to the medication changes.  She was informed about hyperkalemia-and starting Lokelma-she wanted Lokelma discontinued-even after this MD explained the rationale/risks of hyperkalemia.  After I spoke to her-I reached out to the patient-explained rationale for Lokelma-and risk of untreated/worsening hyperkalemia-he was fine with [place] treating him with [facility] (patient is awake/alert and oriented x3)  Addendum 2:30 pm: Spoke to daughter-at RN's request-lab values given. Encouraged her to use mychart.     Code Status :  Full Code  Diet :  Diet Order            Diet Heart Room service appropriate? Yes; Fluid consistency: Thin  Diet effective now                  Disposition Plan  :   Status is: Inpatient  Remains inpatient appropriate because:Inpatient level of care appropriate due to severity of illness  Dispo:  Patient From: Home (LTAC)  Planned Disposition: LTACH  Medically stable for discharge: Yes    Barriers to discharge: Hypoxia requiring O2 supplementation  Antimicorbials  :    Anti-infectives (From admission, onward)   Start     Dose/Rate Route Frequency Ordered Stop   01/30/21 1800  remdesivir 100 mg in sodium chloride 0.9 % 100 mL IVPB  Status:  Discontinued       "Followed by" Linked Group Details   100 mg 200 mL/hr over 30 Minutes Intravenous Daily 01/14/2021 2311 01/29/21 1041   01/29/21 1600  remdesivir 100 mg in sodium chloride 0.9 % 100 mL IVPB       "Followed by" Linked Group Details   100 mg 200 mL/hr over 30 Minutes Intravenous Daily 01/29/21 1041 02/01/21 1001   01/18/2021 2339  remdesivir 100 mg in sodium chloride 0.9 % 100 mL IVPB       "Followed by" Linked Group Details   100 mg 200  mL/hr over 30 Minutes Intravenous  Once 01/22/2021 2311 01/29/21 0028   01/27/2021 2315  remdesivir 100 mg in sodium chloride 0.9 % 100 mL IVPB       "Followed by" Linked Group Details    100 mg 200 mL/hr over 30 Minutes Intravenous  Once 01/18/2021 2311 01/18/2021 2353      Inpatient Medications  Scheduled Meds: . aspirin  81 mg Oral Daily  . enoxaparin (LOVENOX) injection  100 mg Subcutaneous Q12H  . insulin aspart  0-9 Units Subcutaneous TID AC & HS  . melatonin  5 mg Oral QHS  . methylPREDNISolone (SOLU-MEDROL) injection  40 mg Intravenous Q12H  . pantoprazole  40 mg Oral Daily  . rosuvastatin  5 mg Oral Daily  . sodium zirconium cyclosilicate  10 g Oral BID  . tamsulosin  0.4 mg Oral Daily   Continuous Infusions: . sodium chloride 50 mL/hr at 02/12/21 0906   PRN Meds:.acetaminophen, nitroGLYCERIN, [DISCONTINUED] ondansetron **OR** ondansetron (ZOFRAN) IV, phenol, polyethylene glycol   Time Spent in minutes  25   See all Orders from today for further details   Oren Binet M.D on 02/12/2021 at 11:27 AM  To page go to [email or web address] - use universal password  [facility] -  Office  [phone]    Objective:   Vitals:   02/12/21 0421 02/12/21 0727 02/12/21 0800 02/12/21 0843  BP: 101/78 113/75  112/72  Pulse: 80 87  98  Resp: (!) 25 (!) 28  20  Temp: 97.9 F (36.6 C) 98.1 F (36.7 C)    TempSrc: Oral Axillary    SpO2: 99% 90% (!) 87% 90%  Weight:      Height:        Wt Readings from Last 3 Encounters:  02/11/21 101.8 kg  02/07/2021 108.9 kg  01/09/21 114.8 kg     Intake/Output Summary (Last 24 hours) at 02/12/2021 1127 Last data filed at 02/12/2021 0144 Gross per 24 hour  Intake 120 ml  Output 150 ml  Net -30 ml     Physical Exam Gen Exam:Alert awake-not in any distress HEENT:atraumatic, normocephalic Chest: B/L clear to auscultation anteriorly CVS:S1S2 regular Abdomen:soft non tender, non distended Extremities:no edema Neurology: Non focal Skin: no rash   Data Review:    CBC Recent Labs  Lab 02/06/21 0206 02/06/21 0830 02/09/21 0111 02/11/21 1221 02/12/21 0118  WBC 10.1 11.7* 10.1 11.5* 11.2*  HGB 12.9* 13.6  13.3 14.4 14.4  HCT 37.8* 42.2 39.3 43.5 42.2  PLT 322 308 213 159 166  MCV 90.2 91.3 90.1 90.4 89.0  MCH 30.8 29.4 30.5 29.9 30.4  MCHC 34.1 32.2 33.8 33.1 34.1  RDW 13.2 13.4 13.4 13.4 13.5  LYMPHSABS 0.3*  --   --   --   --   MONOABS 0.2  --   --   --   --   EOSABS 0.1  --   --   --   --   BASOSABS 0.0  --   --   --   --     Chemistries  Recent Labs  Lab 02/06/21 0206 02/06/21 0830 02/09/21 0111 02/11/21 1221 02/12/21 0118  NA 132* 133* 132* 133* 130*  K 5.1 4.9 4.8 5.0 5.4*  CL 100 99 100 101 100  CO2 23 23 21* 22 21*  GLUCOSE 119* 105* 96 108* 118*  BUN 29* 29* 38* 41* 40*  CREATININE 1.56* 1.49* 1.50* 1.53* 1.63*  CALCIUM 9.6 9.9 10.1 10.2 10.5*  AST 47* 50*  45* 57* 58*  ALT 44 45* 33 41 40  ALKPHOS 60 62 61 60 56  BILITOT 1.0 1.1 1.2 1.5* 1.8*   ------------------------------------------------------------------------------------------------------------------ No results for input(s): CHOL, HDL, LDLCALC, TRIG, CHOLHDL, LDLDIRECT in the last 72 hours.  Lab Results  Component Value Date   HGBA1C 6.0 (H) 01/29/2021   ------------------------------------------------------------------------------------------------------------------ No results for input(s): TSH, T4TOTAL, T3FREE, THYROIDAB in the last 72 hours.  Invalid input(s): FREET3 ------------------------------------------------------------------------------------------------------------------ No results for input(s): VITAMINB12, FOLATE, FERRITIN, TIBC, IRON, RETICCTPCT in the last 72 hours.  Coagulation profile No results for input(s): INR, PROTIME in the last 168 hours.  Recent Labs    02/11/21 1221 02/12/21 0118  DDIMER 8.64* 7.46*    Cardiac Enzymes No results for input(s): CKMB, TROPONINI, MYOGLOBIN in the last 168 hours.  Invalid input(s): CK ------------------------------------------------------------------------------------------------------------------    Component Value Date/Time   BNP  18.2 02/05/2021 0225    Micro Results No results found for this or any previous visit (from the past 240 hour(s)).  Radiology Reports DG Chest Port 1 View  Result Date: 02/01/2021 CLINICAL DATA:  Shortness of breath.  COVID positive. EXAM: PORTABLE CHEST 1 VIEW COMPARISON:  01/31/2021 FINDINGS: Stable cardiomediastinal contours. Persistent low lung volumes. Mild diffuse increase interstitial markings noted bilaterally. More focal airspace disease is noted within the left lung base. The visualized osseous structures appear intact. IMPRESSION: 1. Stable low lung volumes and increased interstitial markings bilaterally. 2. More focal airspace disease within the left lung base, unchanged from previous exam. Electronically Signed   By: Kerby Moors M.D.   On: 02/01/2021 09:20   DG Chest Port 1 View  Result Date: 01/31/2021 CLINICAL DATA:  70 year old male with a history of shortness of breath and COVID EXAM: PORTABLE CHEST 1 VIEW COMPARISON:  01/29/2021, 01/17/2021 FINDINGS: Cardiomediastinal silhouette unchanged. Low lung volumes persist. Reticulonodular opacities of the lungs, with increasing airspace disease at the left lung base and development of air bronchograms. No pneumothorax. No large pleural effusion. IMPRESSION: Increasing interstitial and airspace disease of the bilateral lungs particularly at the left lung base. Given the air bronchograms, lobar pneumonia/aspiration pneumonia also considered in addition to viral pneumonia. Electronically Signed   By: Corrie Mckusick D.O.   On: 01/31/2021 08:29   DG Chest Port 1 View  Result Date: 01/29/2021 CLINICAL DATA:  Shortness of breath, COVID-19 EXAM: PORTABLE CHEST 1 VIEW COMPARISON:  Portable exam 0820 hours compared to 01/22/2021 FINDINGS: Normal heart size, mediastinal contours, and pulmonary vascularity. Patchy infiltrates in mid to lower LEFT lung and minimally at RIGHT base consistent with multifocal pneumonia. RIGHT lung infiltrate is perhaps  slightly improved since previous exam. No pleural effusion or pneumothorax. Bones unremarkable. IMPRESSION: BILATERAL pulmonary infiltrates consistent with multifocal pneumonia, perhaps minimally improved in RIGHT lung. Electronically Signed   By: Lavonia Dana M.D.   On: 01/29/2021 08:34   DG Chest Port 1 View  Result Date: 01/19/2021 CLINICAL DATA:  Cough.  Coronavirus infection. EXAM: PORTABLE CHEST 1 VIEW COMPARISON:  11/24/2017 FINDINGS: Heart size is normal. Chronic aortic atherosclerosis and tortuosity as seen previously. Hazy pulmonary infiltrates in the mid and lower lungs consistent with viral pneumonia. No dense consolidation. Chronic elevation of the left hemidiaphragm. No evidence of effusion. No acute bone finding. IMPRESSION: 1. Hazy pulmonary infiltrates in the mid and lower lungs consistent with viral pneumonia. No dense consolidation. 2. Aortic atherosclerosis. Electronically Signed   By: Nelson Chimes M.D.   On: 01/26/2021 22:21   DG Chest Port 1V same Day  Result Date: 02/11/2021 CLINICAL DATA:  Shortness of breath, history of prior COVID-19 infection EXAM: PORTABLE CHEST 1 VIEW COMPARISON:  02/01/2021 FINDINGS: Cardiac shadow is within normal limits. Lungs are well aerated bilaterally. Increasing bibasilar infiltrates are seen. No sizable effusion is noted. No bony abnormality is noted. IMPRESSION: Increasing bibasilar infiltrates. Electronically Signed   By: Inez Catalina M.D.   On: 02/11/2021 13:27   VAS [place] LOWER EXTREMITY VENOUS (DVT)  Result Date: 01/31/2021  Lower Venous DVT Study Indications: Covid, d-dimer.  Anticoagulation: Lovenox. Comparison Study: No prior studies. Performing Technologist: Darlin Coco RDMS  Examination Guidelines: A complete evaluation includes B-mode imaging, spectral Doppler, color Doppler, and power Doppler as needed of all accessible portions of each vessel. Bilateral testing is considered an integral part of a complete examination. Limited examinations  for reoccurring indications may be performed as noted. The reflux portion of the exam is performed with the patient in reverse Trendelenburg.  +---------+---------------+---------+-----------+----------+-------------------+ RIGHT    CompressibilityPhasicitySpontaneityPropertiesThrombus Aging      +---------+---------------+---------+-----------+----------+-------------------+ CFV      Full           Yes      Yes                                      +---------+---------------+---------+-----------+----------+-------------------+ SFJ      Full                                                             +---------+---------------+---------+-----------+----------+-------------------+ FV Prox  Full                                                             +---------+---------------+---------+-----------+----------+-------------------+ FV Mid   Full                                                             +---------+---------------+---------+-----------+----------+-------------------+ FV DistalFull                                                             +---------+---------------+---------+-----------+----------+-------------------+ PFV      Full                                                             +---------+---------------+---------+-----------+----------+-------------------+ POP      Full           No       Yes  Rouleaux flow noted +---------+---------------+---------+-----------+----------+-------------------+ PTV      Full                                                             +---------+---------------+---------+-----------+----------+-------------------+ PERO     Full                                                             +---------+---------------+---------+-----------+----------+-------------------+   +---------+---------------+---------+-----------+----------+-------------------+ LEFT      CompressibilityPhasicitySpontaneityPropertiesThrombus Aging      +---------+---------------+---------+-----------+----------+-------------------+ CFV      Full           Yes      Yes                                      +---------+---------------+---------+-----------+----------+-------------------+ SFJ      Full                                                             +---------+---------------+---------+-----------+----------+-------------------+ FV Prox  Full                                                             +---------+---------------+---------+-----------+----------+-------------------+ FV Mid   Full                                                             +---------+---------------+---------+-----------+----------+-------------------+ FV DistalFull                                                             +---------+---------------+---------+-----------+----------+-------------------+ PFV      Full                                                             +---------+---------------+---------+-----------+----------+-------------------+ POP      Full           No       Yes                  Rouleaux flow noted +---------+---------------+---------+-----------+----------+-------------------+ PTV  Full                                                             +---------+---------------+---------+-----------+----------+-------------------+ PERO     Full                                                             +---------+---------------+---------+-----------+----------+-------------------+     Summary: RIGHT: - There is no evidence of deep vein thrombosis in the lower extremity.  - No cystic structure found in the popliteal fossa.  LEFT: - There is no evidence of deep vein thrombosis in the lower extremity.  - No cystic structure found in the popliteal fossa.  *See table(s) above for measurements and observations.  Electronically signed by Jamelle Haring on 01/31/2021 at 10:53:05 AM.    Final    ECHOCARDIOGRAM LIMITED  Result Date: 01/30/2021    ECHOCARDIOGRAM LIMITED REPORT   Patient Name:   YASHAR INCLAN Binz Date of Exam: 01/30/2021 Medical Rec #:  [id number]    Height:       72.0 in Accession #:    [id number]   Weight:       237.2 lb Date of Birth:  08-Mar-1951    BSA:          2.291 m Patient Age:    65 years     BP:           103/65 mmHg Patient Gender: M            HR:           52 bpm. Exam Location:  Inpatient Procedure: Limited Echo, Cardiac Doppler and Color Doppler Indications:    CHF-Acute diastolic  History:        Patient has no prior history of Echocardiogram examinations.                 CAD; Risk Factors:Sleep Apnea. CKD. GERD.  Sonographer:    Clayton Lefort RDCS (AE) Referring Phys: [id number] Margaree Mackintosh [place]  1. Left ventricular ejection fraction, by estimation, is 60 to 65%. The left ventricle has normal function. The left ventricle has no regional Grieco motion abnormalities. There is mild left ventricular hypertrophy. Left ventricular diastolic parameters are consistent with Grade I diastolic dysfunction (impaired relaxation).  2. Right ventricular systolic function is normal. The right ventricular size is normal.  3. The mitral valve is normal in structure. No evidence of mitral valve regurgitation. No evidence of mitral stenosis.  4. The aortic valve is normal in structure. Aortic valve regurgitation is not visualized. No aortic stenosis is present.  5. The inferior vena cava is normal in size with greater than 50% respiratory variability, suggesting right atrial pressure of 3 mmHg. FINDINGS  Left Ventricle: Left ventricular ejection fraction, by estimation, is 60 to 65%. The left ventricle has normal function. The left ventricle has no regional Och motion abnormalities. The left ventricular internal cavity size was normal in size. There is  mild left ventricular hypertrophy. Left ventricular diastolic  parameters are consistent with Grade I diastolic dysfunction (impaired relaxation). Right Ventricle: The right ventricular  size is normal. No increase in right ventricular [place] thickness. Right ventricular systolic function is normal. Left Atrium: Left atrial size was normal in size. Right Atrium: Right atrial size was normal in size. Pericardium: There is no evidence of pericardial effusion. Mitral Valve: The mitral valve is normal in structure. No evidence of mitral valve stenosis. Tricuspid Valve: The tricuspid valve is normal in structure. Tricuspid valve regurgitation is not demonstrated. No evidence of tricuspid stenosis. Aortic Valve: The aortic valve is normal in structure. Aortic valve regurgitation is not visualized. No aortic stenosis is present. Aortic valve mean gradient measures 5.0 mmHg. Aortic valve peak gradient measures 7.1 mmHg. Aortic valve area, by VTI measures 2.23 cm. Pulmonic Valve: The pulmonic valve was normal in structure. Pulmonic valve regurgitation is not visualized. No evidence of pulmonic stenosis. Aorta: The aortic root is normal in size and structure. Venous: The inferior vena cava is normal in size with greater than 50% respiratory variability, suggesting right atrial pressure of 3 mmHg. IAS/Shunts: No atrial level shunt detected by color flow Doppler. LEFT VENTRICLE PLAX 2D LVIDd:         4.30 cm  Diastology LVIDs:         2.80 cm  LV e' medial:    5.22 cm/s LV PW:         1.40 cm  LV E/e' medial:  10.7 LV IVS:        1.20 cm  LV e' lateral:   6.85 cm/s LVOT diam:     2.00 cm  LV E/e' lateral: 8.2 LV SV:         69 LV SV Index:   30 LVOT Area:     3.14 cm  IVC IVC diam: 1.80 cm LEFT ATRIUM         Index LA diam:    3.20 cm 1.40 cm/m  AORTIC VALVE AV Area (Vmax):    2.65 cm AV Area (Vmean):   2.20 cm AV Area (VTI):     2.23 cm AV Vmax:           133.00 cm/s AV Vmean:          103.000 cm/s AV VTI:            0.310 m AV Peak Grad:      7.1 mmHg AV Mean Grad:      5.0 mmHg LVOT  Vmax:         112.00 cm/s LVOT Vmean:        72.000 cm/s LVOT VTI:          0.220 m LVOT/AV VTI ratio: 0.71  AORTA Ao Root diam: 3.70 cm Ao Asc diam:  3.30 cm MITRAL VALVE MV Area (PHT): 2.44 cm    SHUNTS MV Decel Time: 311 msec    Systemic VTI:  0.22 m MV E velocity: 56.10 cm/s  Systemic Diam: 2.00 cm MV A velocity: 50.60 cm/s MV E/A ratio:  1.11 Candee Furbish MD Electronically signed by Candee Furbish MD Signature Date/Time: 01/30/2021/3:26:43 PM    Final

## 2021-02-12 NOTE — Progress Notes (Signed)
Spoke with patients daughter via phone. She is requesting information regarding todays lab work. This nurse informed her she would need to speak directly to physician regarding any labs or test results to further answer questions. She agrees. Message sent to physician to give her a call.

## 2021-02-12 NOTE — Progress Notes (Addendum)
Physical Therapy Treatment Patient Details Name: Kurt Williams MRN: [id number] DOB: 02-Nov-1951 Today's Date: 02/12/2021    History of Present Illness Pt is a 70 y.o. male admitted 01/30/2021 with SOB and cough; workup for acute hypoxic respiratory failure due to COVID-19 pneumonitis; course complicated as pt requiring continuous HHFNC. No evidence of DVT. PMH includes CAD, OSA on CPAP.   PT Comments    Pt slowly progressing, with significant fatigue; tolerated minimal activity bout this session with significant effort, requiring prolonged time to recover DOE. SpO2 down to 80% on 35L O2 HHFNC at 100% FiO2 + NRB; HR 108. Will continue to follow acutely and progress activity as tolerated.   Follow Up Recommendations  LTACH;Supervision for mobility/OOB     Equipment Recommendations   (defer)    Recommendations for Other Services       Precautions / Restrictions Precautions Precautions: Fall;Other (comment) Precaution Comments: Watch SpO2 on 35 L HHFNCat 100% FiO2 + NRB    Mobility  Bed Mobility               General bed mobility comments: Received sitting EOB reports "recovering" having recently come from supine to sitting    Transfers Overall transfer level: Needs assistance Equipment used: None Transfers: Sit to/from Stand Sit to Stand: Supervision         General transfer comment: Supervision for safety/lines  Ambulation/Gait Ambulation/Gait assistance: Min guard Gait Distance (Feet): 2 Feet Assistive device: None Gait Pattern/deviations: Step-to pattern;Trunk flexed     General Gait Details: Steps to recliner without DME, min guard for balance/lines with SpO2 down to 80%; pt still does not feel recovered after >8 min sitting rest break post-transfer   Stairs             Wheelchair Mobility    Modified Rankin (Stroke Patients Only)       Balance Overall balance assessment: Needs assistance Sitting-balance support: Feet supported;No upper  extremity supported Sitting balance-Leahy Scale: Good     Standing balance support: No upper extremity supported;During functional activity Standing balance-Leahy Scale: Fair                              Cognition Arousal/Alertness: Awake/alert Behavior During Therapy: WFL for tasks assessed/performed;Flat affect Overall Cognitive Status: Within Functional Limits for tasks assessed                                 General Comments: Increased fatigue      Exercises Other Exercises Other Exercises: Pursed lip breathing with upright sitting    General Comments General comments (skin integrity, edema, etc.): SpO2 down to 80% on 35L O2 HHFNC at 100% FiO2 + NRB; return to 87-94% with prolonged seated rest and cues for deep breathing. Pt too fatigued for additional standing/seated therex - encouraged pt to perform bouts of seated LE therex this afternoon      Pertinent Vitals/Pain Pain Assessment: No/denies pain Pain Intervention(s): Monitored during session    Home Living                      Prior Function            PT Goals (current goals can now be found in the care plan section) Acute Rehab PT Goals Patient Stated Goal: Beat this PT Goal Formulation: With patient Time For Goal Achievement: 02/26/21 Potential to  Achieve Goals: Fair Progress towards PT goals: Not progressing toward goals - comment (limited by fatigue, high O2 needs, desaturation)    Frequency    Min 3X/week      PT Plan Current plan remains appropriate    Co-evaluation              AM-PAC PT "6 Clicks" Mobility   Outcome Measure  Help needed turning from your back to your side while in a flat bed without using bedrails?: A Little Help needed moving from lying on your back to sitting on the side of a flat bed without using bedrails?: A Little Help needed moving to and from a bed to a chair (including a wheelchair)?: A Little Help needed standing up from  a chair using your arms (e.g., wheelchair or bedside chair)?: A Little Help needed to walk in hospital room?: A Little Help needed climbing 3-5 steps with a railing? : A Lot 6 Click Score: 17    End of Session Equipment Utilized During Treatment: Oxygen Activity Tolerance: Patient limited by fatigue;Treatment limited secondary to medical complications (Comment) Patient left: in chair;with call bell/phone within reach Nurse Communication: Mobility status PT Visit Diagnosis: Other abnormalities of gait and mobility (R26.89)     Time: 3329-5188 PT Time Calculation (min) (ACUTE ONLY): 17 min  Charges:  $Therapeutic Activity: 8-22 mins                     Mabeline Caras, PT, DPT Acute Rehabilitation Services  Pager [phone] Office [place] 02/12/2021, 12:14 PM

## 2021-02-12 NOTE — Progress Notes (Signed)
Spoke with RN and was briefed as to patients condition. Unable to comply with what is needed to complete study.

## 2021-02-13 ENCOUNTER — Inpatient Hospital Stay (HOSPITAL_COMMUNITY): Payer: Medicare Other

## 2021-02-13 DIAGNOSIS — U071 COVID-19: Secondary | ICD-10-CM | POA: Diagnosis not present

## 2021-02-13 DIAGNOSIS — N1831 Chronic kidney disease, stage 3a: Secondary | ICD-10-CM | POA: Diagnosis not present

## 2021-02-13 DIAGNOSIS — J9601 Acute respiratory failure with hypoxia: Secondary | ICD-10-CM | POA: Diagnosis not present

## 2021-02-13 DIAGNOSIS — G4733 Obstructive sleep apnea (adult) (pediatric): Secondary | ICD-10-CM | POA: Diagnosis not present

## 2021-02-13 LAB — OSMOLALITY: Osmolality: 278 mOsm/kg (ref 275–295)

## 2021-02-13 LAB — COMPREHENSIVE METABOLIC PANEL
ALT: 39 U/L (ref 0–44)
AST: 58 U/L — ABNORMAL HIGH (ref 15–41)
Albumin: 3.2 g/dL — ABNORMAL LOW (ref 3.5–5.0)
Alkaline Phosphatase: 51 U/L (ref 38–126)
Anion gap: 9 (ref 5–15)
BUN: 36 mg/dL — ABNORMAL HIGH (ref 8–23)
CO2: 19 mmol/L — ABNORMAL LOW (ref 22–32)
Calcium: 9.9 mg/dL (ref 8.9–10.3)
Chloride: 99 mmol/L (ref 98–111)
Creatinine, Ser: 1.48 mg/dL — ABNORMAL HIGH (ref 0.61–1.24)
GFR, Estimated: 50 mL/min — ABNORMAL LOW (ref >60)
Glucose, Bld: 108 mg/dL — ABNORMAL HIGH (ref 70–99)
Potassium: 4.9 mmol/L (ref 3.5–5.1)
Sodium: 127 mmol/L — ABNORMAL LOW (ref 135–145)
Total Bilirubin: 1.7 mg/dL — ABNORMAL HIGH (ref 0.3–1.2)
Total Protein: 6.5 g/dL (ref 6.5–8.1)

## 2021-02-13 LAB — PROCALCITONIN: Procalcitonin: <0.1 ng/mL

## 2021-02-13 LAB — CBC
HCT: 39.8 % (ref 39.0–52.0)
Hemoglobin: 13.4 g/dL (ref 13.0–17.0)
MCH: 30.3 pg (ref 26.0–34.0)
MCHC: 33.7 g/dL (ref 30.0–36.0)
MCV: 90 fL (ref 80.0–100.0)
Platelets: 126 10*3/uL — ABNORMAL LOW (ref 150–400)
RBC: 4.42 MIL/uL (ref 4.22–5.81)
RDW: 13.2 % (ref 11.5–15.5)
WBC: 12.1 10*3/uL — ABNORMAL HIGH (ref 4.0–10.5)
nRBC: 0 % (ref 0.0–0.2)

## 2021-02-13 LAB — BRAIN NATRIURETIC PEPTIDE: B Natriuretic Peptide: 26.3 pg/mL (ref 0.0–100.0)

## 2021-02-13 LAB — GLUCOSE, CAPILLARY
Glucose-Capillary: 101 mg/dL — ABNORMAL HIGH (ref 70–99)
Glucose-Capillary: 102 mg/dL — ABNORMAL HIGH (ref 70–99)
Glucose-Capillary: 114 mg/dL — ABNORMAL HIGH (ref 70–99)
Glucose-Capillary: 125 mg/dL — ABNORMAL HIGH (ref 70–99)

## 2021-02-13 LAB — D-DIMER, QUANTITATIVE: D-Dimer, Quant: 3.81 ug/mL-FEU — ABNORMAL HIGH (ref 0.00–0.50)

## 2021-02-13 NOTE — Progress Notes (Signed)
OT Cancellation Note  Patient Details Name: Kurt Williams MRN: [id number] DOB: 10-Nov-1951   Cancelled Treatment:    Reason Eval/Treat Not Completed: Medical issues which prohibited therapy.  Increasing 02 needs.  Will check back.  [name]., OTR/L Acute Rehabilitation Services Pager [phone] Office [phone]   [name] 02/13/2021, 9:58 AM

## 2021-02-13 NOTE — Progress Notes (Signed)
CSW appreciates [facility] Supervisor, Denyse Amass, with assistance notorizing patient's HCPOA paperwork. Two witnesses present (non-staff members). RN to make three copies of paperwork and have patient keep the original. Patient's daughter, Janae Bridgeman, aware.   Mickie Badders LCSW

## 2021-02-13 NOTE — Progress Notes (Signed)
   02/13/21 1331  Clinical Encounter Type  Visited With Other (Comment) (Spoke with nurse, Eugene Garnet 2x's via phone)  Visit Type Other (Comment) (Advance Directive Education)  Referral From Nurse  Consult/Referral To Chaplain  Chaplain responded. Spoke with nurse, Eugene Garnet. She is requesting AD for the patient who has COVID. The nurse stated, the patient has multiple children, and the patient has designated a daughter(unnamed) to make healthcare decisions. Chaplain explained Spiritual Care Dept must provide witnesses from [employer], and [place] notary are not allowed to go into COVID patient's rooms. Rebekah expressed unfair COVID patient cannot receive this type of care. Chaplain explained the process. Rebekah asked if she and another staff member could be a witness, Chaplain stated the Chaplains are unable to authorize other healthcare providers a s witness per witnesses.  Chaplain referred nurse to her unit Director for further direction.  The Chaplain reviewed the AD document and called Rebekah to review section 2 of part C. This section indicates [facility] healthcare providers cannot be witnesses for our patients.  Chaplain remains available if other support is needed for the patient or staff.  This note was prepared by Jeanine Luz, M.Div..  For questions please contact by phone [phone].

## 2021-02-13 NOTE — Progress Notes (Signed)
PROGRESS NOTE                                                                                                                                                                                                             Patient Demographics:    Kurt Williams, is a 70 y.o. male, DOB - 28-Apr-1951, TLX:[id number]  Outpatient Primary MD for the patient is Shelda Pal, DO   Admit date - 02/01/2021   LOS - 67  Chief Complaint  Patient presents with  . Covid Positive       Brief Narrative: Patient is a 70 y.o. male with PMHx of CAD, OSA on CPAP-presented with severe hypoxic respiratory failure due to COVID-19 pneumonia.   COVID-19 vaccinated status: Unvaccinated  Significant Events: 2/16>> Admit to [facility] for severe hypoxia due to COVID-19 pneumonia  Significant studies: 2/16>>Chest x-ray: Bilateral hazy infiltrates 2/17>> bilateral lower extremity Doppler: No DVT 2/18>> Echo: EF 74-16%, grade 1 diastolic dysfunction 3/8>> bilateral lower extremity Doppler: Negative for DVT  COVID-19 medications: Steroids: 2/16>> Remdesivir: 2/16>> 2/20 Actemra: 2/17 x 1  Antibiotics: None  Microbiology data: 2/16 >>blood culture: No growth  Procedures: None  Consults: None  DVT prophylaxis: Therapeutic Lovenox    Subjective:   Continues to have significant shortness of breath with minimal ambulation-on heated high flow with 100% FiO2 at 50 L/min +15 L via NRB.  Per nursing staff-easily desaturates with minimal activity-and takes approximately half an hour to recover at times.   Assessment  & Plan :   Acute Hypoxic Resp Failure due to Covid 19 Viral pneumonia: Continues to have severe hypoxemia-oxygen requirement seems to have worsened over the past few days.  No signs of volume overload.  Remains very tenuous-we will have a very low threshold to transfer to the ICU.    Long discussion with patient-he understands  the tenuous clinical situation-I have asked him to start talking to family regarding CODE STATUS.  I have also called his daughter-and have updated her regarding above-and has encouraged her to talk to patient regarding CODE STATUS/end-of-life issues.    Fever: afebrile O2 requirements:  SpO2: 96 % O2 Flow Rate (L/min): 50 L/min (+ 15L NRB) FiO2 (%): 100 %   COVID-19 Labs: Recent Labs    02/11/21 1221 02/12/21 0118 02/13/21 0106  DDIMER 8.64* 7.46* 3.81*  Component Value Date/Time   BNP 26.3 02/13/2021 0106    No results for input(s): PROCALCITON in the last 168 hours.  Lab Results  Component Value Date   SARSCOV2NAA POSITIVE (A) 01/30/2021    Prone/Incentive Spirometry: encouraged patient to lie prone for 3-4 hours at a time for a total of 16 hours a day, and to encourage incentive spirometry use 3-4/hour.  Elevated D-dimer: Secondary to COVID-19 related inflammation-at risk for VTE-repeat Doppler on 3/3 --unable to lie flat for VQ scan-Per RT/nursing staff-due to very high oxygen requirements-not able to transfer to radiology in any event.  Continue therapeutic Lovenox.    CAD: No anginal symptoms-continue aspirin/statin  Hyperkalemia: Resolved after Lokelma x2.  Hyponatremia: Euvolemic on exam-received IV fluids yesterday-recheck electrolytes tomorrow.  If hyponatremia persists-may need to start IV Lasix-at risk for SIADH given lung infiltrates.  Check serum osmolality, urine osmolality and urine sodium.  AKI on CKD stage IIIa: creatinine stable-close to baseline.  Lab Results  Component Value Date   CREATININE 1.48 (H) 02/13/2021   CREATININE 1.63 (H) 02/12/2021   CREATININE 1.53 (H) 02/11/2021   OSA on CPAP: On heated high flow-when oxygen requirements are better-can consider placing back on CPAP.  Obesity: Estimated body mass index is 30.44 kg/m as calculated from the following:   Height as of this encounter: 6' (1.829 m).   Weight as of this encounter:  101.8 kg.    GI prophylaxis: PPI  ABG:    Component Value Date/Time   PHART 7.391 02/08/2009 0417   PCO2ART 37.5 02/08/2009 0417   PO2ART 95.0 02/08/2009 0417   HCO3 22.7 02/08/2009 0417   TCO2 24 02/08/2009 0417   ACIDBASEDEF 2.0 02/08/2009 0417   O2SAT 97.0 02/08/2009 0417    Vent Settings:  FiO2 (%):  [100 %] 100 %  Condition - Extremely Guarded  Family Communication  : Daughter-[phone]- updated over the phone on 3/4   Code Status :  Full Code  Diet :  Diet Order            Diet Heart Room service appropriate? Yes; Fluid consistency: Thin  Diet effective now                  Disposition Plan  :   Status is: Inpatient  Remains inpatient appropriate because:Inpatient level of care appropriate due to severity of illness  Dispo:  Patient From: Home (LTAC)  Planned Disposition: LTACH  Medically stable for discharge: Yes    Barriers to discharge: Hypoxia requiring O2 supplementation  Antimicorbials  :    Anti-infectives (From admission, onward)   Start     Dose/Rate Route Frequency Ordered Stop   01/30/21 1800  remdesivir 100 mg in sodium chloride 0.9 % 100 mL IVPB  Status:  Discontinued       "Followed by" Linked Group Details   100 mg 200 mL/hr over 30 Minutes Intravenous Daily 01/20/2021 2311 01/29/21 1041   01/29/21 1600  remdesivir 100 mg in sodium chloride 0.9 % 100 mL IVPB       "Followed by" Linked Group Details   100 mg 200 mL/hr over 30 Minutes Intravenous Daily 01/29/21 1041 02/01/21 1001   01/23/2021 2339  remdesivir 100 mg in sodium chloride 0.9 % 100 mL IVPB       "Followed by" Linked Group Details   100 mg 200 mL/hr over 30 Minutes Intravenous  Once 01/23/2021 2311 01/29/21 0028   02/06/2021 2315  remdesivir 100 mg in sodium chloride 0.9 % 100  mL IVPB       "Followed by" Linked Group Details   100 mg 200 mL/hr over 30 Minutes Intravenous  Once 01/20/2021 2311 01/19/2021 2353      Inpatient Medications  Scheduled Meds: . aspirin  81  mg Oral Daily  . enoxaparin (LOVENOX) injection  100 mg Subcutaneous Q12H  . insulin aspart  0-9 Units Subcutaneous TID AC & HS  . melatonin  5 mg Oral QHS  . methylPREDNISolone (SOLU-MEDROL) injection  40 mg Intravenous Q12H  . pantoprazole  40 mg Oral Daily  . rosuvastatin  5 mg Oral Daily  . tamsulosin  0.4 mg Oral Daily   Continuous Infusions:  PRN Meds:.acetaminophen, nitroGLYCERIN, [DISCONTINUED] ondansetron **OR** ondansetron (ZOFRAN) IV, phenol, polyethylene glycol   Time Spent in minutes  25   See all Orders from today for further details   Oren Binet M.D on 02/13/2021 at 11:52 AM  To page go to [email or web address] - use universal password  [facility] -  Office  [phone]    Objective:   Vitals:   02/12/21 2320 02/13/21 0341 02/13/21 0718 02/13/21 0938  BP: 117/65 122/77 111/72   Pulse: 86 (!) 104 81 97  Resp: (!) 30 20 20  (!) 24  Temp: 98 F (36.7 C) 98.1 F (36.7 C) 98 F (36.7 C)   TempSrc: Axillary Axillary Oral   SpO2: 99% 92% 92% 96%  Weight:      Height:        Wt Readings from Last 3 Encounters:  02/11/21 101.8 kg  01/15/2021 108.9 kg  01/09/21 114.8 kg     Intake/Output Summary (Last 24 hours) at 02/13/2021 1152 Last data filed at 02/13/2021 0548 Gross per 24 hour  Intake 515 ml  Output 1050 ml  Net -535 ml     Physical Exam Gen Exam:Alert awake-not in any distress HEENT:atraumatic, normocephalic Chest: B/L clear to auscultation anteriorly CVS:S1S2 regular Abdomen:soft non tender, non distended Extremities:no edema Neurology: Non focal Skin: no rash   Data Review:    CBC Recent Labs  Lab 02/09/21 0111 02/11/21 1221 02/12/21 0118 02/13/21 0106  WBC 10.1 11.5* 11.2* 12.1*  HGB 13.3 14.4 14.4 13.4  HCT 39.3 43.5 42.2 39.8  PLT 213 159 166 126*  MCV 90.1 90.4 89.0 90.0  MCH 30.5 29.9 30.4 30.3  MCHC 33.8 33.1 34.1 33.7  RDW 13.4 13.4 13.5 13.2    Chemistries  Recent Labs  Lab 02/09/21 0111 02/11/21 1221  02/12/21 0118 02/13/21 0106  NA 132* 133* 130* 127*  K 4.8 5.0 5.4* 4.9  CL 100 101 100 99  CO2 21* 22 21* 19*  GLUCOSE 96 108* 118* 108*  BUN 38* 41* 40* 36*  CREATININE 1.50* 1.53* 1.63* 1.48*  CALCIUM 10.1 10.2 10.5* 9.9  AST 45* 57* 58* 58*  ALT 33 41 40 39  ALKPHOS 61 60 56 51  BILITOT 1.2 1.5* 1.8* 1.7*   ------------------------------------------------------------------------------------------------------------------ No results for input(s): CHOL, HDL, LDLCALC, TRIG, CHOLHDL, LDLDIRECT in the last 72 hours.  Lab Results  Component Value Date   HGBA1C 6.0 (H) 01/29/2021   ------------------------------------------------------------------------------------------------------------------ No results for input(s): TSH, T4TOTAL, T3FREE, THYROIDAB in the last 72 hours.  Invalid input(s): FREET3 ------------------------------------------------------------------------------------------------------------------ No results for input(s): VITAMINB12, FOLATE, FERRITIN, TIBC, IRON, RETICCTPCT in the last 72 hours.  Coagulation profile No results for input(s): INR, PROTIME in the last 168 hours.  Recent Labs    02/12/21 0118 02/13/21 0106  DDIMER 7.46* 3.81*    Cardiac Enzymes No  results for input(s): CKMB, TROPONINI, MYOGLOBIN in the last 168 hours.  Invalid input(s): CK ------------------------------------------------------------------------------------------------------------------    Component Value Date/Time   BNP 26.3 02/13/2021 0106    Micro Results No results found for this or any previous visit (from the past 240 hour(s)).  Radiology Reports DG Chest Port 1 View  Result Date: 02/13/2021 CLINICAL DATA:  Shortness of breath, COVID-19 positivity EXAM: PORTABLE CHEST 1 VIEW COMPARISON:  02/11/2021 FINDINGS: Cardiac shadow is stable. Lungs are well aerated bilaterally. Persistent bibasilar infiltrates are noted stable from the previous day. No sizable pneumothorax is  noted. IMPRESSION: Persistent bibasilar infiltrates stable from the previous day. Electronically Signed   By: Inez Catalina M.D.   On: 02/13/2021 07:20   DG Chest Port 1 View  Result Date: 02/01/2021 CLINICAL DATA:  Shortness of breath.  COVID positive. EXAM: PORTABLE CHEST 1 VIEW COMPARISON:  01/31/2021 FINDINGS: Stable cardiomediastinal contours. Persistent low lung volumes. Mild diffuse increase interstitial markings noted bilaterally. More focal airspace disease is noted within the left lung base. The visualized osseous structures appear intact. IMPRESSION: 1. Stable low lung volumes and increased interstitial markings bilaterally. 2. More focal airspace disease within the left lung base, unchanged from previous exam. Electronically Signed   By: Kerby Moors M.D.   On: 02/01/2021 09:20   DG Chest Port 1 View  Result Date: 01/31/2021 CLINICAL DATA:  70 year old male with a history of shortness of breath and COVID EXAM: PORTABLE CHEST 1 VIEW COMPARISON:  01/29/2021, 01/31/2021 FINDINGS: Cardiomediastinal silhouette unchanged. Low lung volumes persist. Reticulonodular opacities of the lungs, with increasing airspace disease at the left lung base and development of air bronchograms. No pneumothorax. No large pleural effusion. IMPRESSION: Increasing interstitial and airspace disease of the bilateral lungs particularly at the left lung base. Given the air bronchograms, lobar pneumonia/aspiration pneumonia also considered in addition to viral pneumonia. Electronically Signed   By: Corrie Mckusick D.O.   On: 01/31/2021 08:29   DG Chest Port 1 View  Result Date: 01/29/2021 CLINICAL DATA:  Shortness of breath, COVID-19 EXAM: PORTABLE CHEST 1 VIEW COMPARISON:  Portable exam 0820 hours compared to 02/01/2021 FINDINGS: Normal heart size, mediastinal contours, and pulmonary vascularity. Patchy infiltrates in mid to lower LEFT lung and minimally at RIGHT base consistent with multifocal pneumonia. RIGHT lung  infiltrate is perhaps slightly improved since previous exam. No pleural effusion or pneumothorax. Bones unremarkable. IMPRESSION: BILATERAL pulmonary infiltrates consistent with multifocal pneumonia, perhaps minimally improved in RIGHT lung. Electronically Signed   By: Lavonia Dana M.D.   On: 01/29/2021 08:34   DG Chest Port 1 View  Result Date: 02/01/2021 CLINICAL DATA:  Cough.  Coronavirus infection. EXAM: PORTABLE CHEST 1 VIEW COMPARISON:  11/24/2017 FINDINGS: Heart size is normal. Chronic aortic atherosclerosis and tortuosity as seen previously. Hazy pulmonary infiltrates in the mid and lower lungs consistent with viral pneumonia. No dense consolidation. Chronic elevation of the left hemidiaphragm. No evidence of effusion. No acute bone finding. IMPRESSION: 1. Hazy pulmonary infiltrates in the mid and lower lungs consistent with viral pneumonia. No dense consolidation. 2. Aortic atherosclerosis. Electronically Signed   By: Nelson Chimes M.D.   On: 01/30/2021 22:21   DG Chest Port 1V same Day  Result Date: 02/11/2021 CLINICAL DATA:  Shortness of breath, history of prior COVID-19 infection EXAM: PORTABLE CHEST 1 VIEW COMPARISON:  02/01/2021 FINDINGS: Cardiac shadow is within normal limits. Lungs are well aerated bilaterally. Increasing bibasilar infiltrates are seen. No sizable effusion is noted. No bony abnormality is noted.  IMPRESSION: Increasing bibasilar infiltrates. Electronically Signed   By: Inez Catalina M.D.   On: 02/11/2021 13:27   VAS [place] LOWER EXTREMITY VENOUS (DVT)  Result Date: 02/12/2021  Lower Venous DVT Study Indications: Covid.  Risk Factors: None identified. Comparison Study: No previous Performing Technologist: Vonzell Schlatter RVT  Examination Guidelines: A complete evaluation includes B-mode imaging, spectral Doppler, color Doppler, and power Doppler as needed of all accessible portions of each vessel. Bilateral testing is considered an integral part of a complete examination. Limited  examinations for reoccurring indications may be performed as noted. The reflux portion of the exam is performed with the patient in reverse Trendelenburg.  +---------+---------------+---------+-----------+----------+--------------+ RIGHT    CompressibilityPhasicitySpontaneityPropertiesThrombus Aging +---------+---------------+---------+-----------+----------+--------------+ CFV      Full           Yes      Yes                                 +---------+---------------+---------+-----------+----------+--------------+ SFJ      Full                                                        +---------+---------------+---------+-----------+----------+--------------+ FV Prox  Full                                                        +---------+---------------+---------+-----------+----------+--------------+ FV Mid   Full                                                        +---------+---------------+---------+-----------+----------+--------------+ FV DistalFull                                                        +---------+---------------+---------+-----------+----------+--------------+ PFV      Full                                                        +---------+---------------+---------+-----------+----------+--------------+ POP      Full           Yes      Yes                                 +---------+---------------+---------+-----------+----------+--------------+ PTV      Full                                                        +---------+---------------+---------+-----------+----------+--------------+  PERO     Full                                                        +---------+---------------+---------+-----------+----------+--------------+   +---------+---------------+---------+-----------+----------+--------------+ LEFT     CompressibilityPhasicitySpontaneityPropertiesThrombus Aging  +---------+---------------+---------+-----------+----------+--------------+ CFV      Full           Yes      Yes                                 +---------+---------------+---------+-----------+----------+--------------+ SFJ      Full                                                        +---------+---------------+---------+-----------+----------+--------------+ FV Prox  Full                                                        +---------+---------------+---------+-----------+----------+--------------+ FV Mid   Full                                                        +---------+---------------+---------+-----------+----------+--------------+ FV DistalFull                                                        +---------+---------------+---------+-----------+----------+--------------+ PFV      Full                                                        +---------+---------------+---------+-----------+----------+--------------+ POP      Full           Yes      Yes                                 +---------+---------------+---------+-----------+----------+--------------+ PTV      Full                                                        +---------+---------------+---------+-----------+----------+--------------+ PERO     Full                                                        +---------+---------------+---------+-----------+----------+--------------+  Summary: BILATERAL: - No evidence of deep vein thrombosis seen in the lower extremities, bilaterally. -No evidence of popliteal cyst, bilaterally.   *See table(s) above for measurements and observations. Electronically signed by Monica Martinez MD on 02/12/2021 at 3:46:21 PM.    Final    VAS [place] LOWER EXTREMITY VENOUS (DVT)  Result Date: 01/31/2021  Lower Venous DVT Study Indications: Covid, d-dimer.  Anticoagulation: Lovenox. Comparison Study: No prior studies. Performing Technologist: Darlin Coco RDMS  Examination Guidelines: A complete evaluation includes B-mode imaging, spectral Doppler, color Doppler, and power Doppler as needed of all accessible portions of each vessel. Bilateral testing is considered an integral part of a complete examination. Limited examinations for reoccurring indications may be performed as noted. The reflux portion of the exam is performed with the patient in reverse Trendelenburg.  +---------+---------------+---------+-----------+----------+-------------------+ RIGHT    CompressibilityPhasicitySpontaneityPropertiesThrombus Aging      +---------+---------------+---------+-----------+----------+-------------------+ CFV      Full           Yes      Yes                                      +---------+---------------+---------+-----------+----------+-------------------+ SFJ      Full                                                             +---------+---------------+---------+-----------+----------+-------------------+ FV Prox  Full                                                             +---------+---------------+---------+-----------+----------+-------------------+ FV Mid   Full                                                             +---------+---------------+---------+-----------+----------+-------------------+ FV DistalFull                                                             +---------+---------------+---------+-----------+----------+-------------------+ PFV      Full                                                             +---------+---------------+---------+-----------+----------+-------------------+ POP      Full           No       Yes                  Rouleaux flow noted +---------+---------------+---------+-----------+----------+-------------------+ PTV      Full                                                              +---------+---------------+---------+-----------+----------+-------------------+  PERO     Full                                                             +---------+---------------+---------+-----------+----------+-------------------+   +---------+---------------+---------+-----------+----------+-------------------+ LEFT     CompressibilityPhasicitySpontaneityPropertiesThrombus Aging      +---------+---------------+---------+-----------+----------+-------------------+ CFV      Full           Yes      Yes                                      +---------+---------------+---------+-----------+----------+-------------------+ SFJ      Full                                                             +---------+---------------+---------+-----------+----------+-------------------+ FV Prox  Full                                                             +---------+---------------+---------+-----------+----------+-------------------+ FV Mid   Full                                                             +---------+---------------+---------+-----------+----------+-------------------+ FV DistalFull                                                             +---------+---------------+---------+-----------+----------+-------------------+ PFV      Full                                                             +---------+---------------+---------+-----------+----------+-------------------+ POP      Full           No       Yes                  Rouleaux flow noted +---------+---------------+---------+-----------+----------+-------------------+ PTV      Full                                                             +---------+---------------+---------+-----------+----------+-------------------+ PERO     Full                                                             +---------+---------------+---------+-----------+----------+-------------------+  Summary: RIGHT: - There is no evidence of deep vein thrombosis in the lower extremity.  - No cystic structure found in the popliteal fossa.  LEFT: - There is no evidence of deep vein thrombosis in the lower extremity.  - No cystic structure found in the popliteal fossa.  *See table(s) above for measurements and observations. Electronically signed by Jamelle Haring on 01/31/2021 at 10:53:05 AM.    Final    ECHOCARDIOGRAM LIMITED  Result Date: 01/30/2021    ECHOCARDIOGRAM LIMITED REPORT   Patient Name:   Kurt Williams Date of Exam: 01/30/2021 Medical Rec #:  [id number]    Height:       72.0 in Accession #:    [id number]   Weight:       237.2 lb Date of Birth:  [birth year]    BSA:          2.291 m Patient Age:    25 years     BP:           103/65 mmHg Patient Gender: M            HR:           52 bpm. Exam Location:  Inpatient Procedure: Limited Echo, Cardiac Doppler and Color Doppler Indications:    CHF-Acute diastolic  History:        Patient has no prior history of Echocardiogram examinations.                 CAD; Risk Factors:Sleep Apnea. CKD. GERD.  Sonographer:    Clayton Lefort RDCS (AE) Referring Phys: [id number]intosh [place]  1. Left ventricular ejection fraction, by estimation, is 60 to 65%. The left ventricle has normal function. The left ventricle has no regional Buch motion abnormalities. There is mild left ventricular hypertrophy. Left ventricular diastolic parameters are consistent with Grade I diastolic dysfunction (impaired relaxation).  2. Right ventricular systolic function is normal. The right ventricular size is normal.  3. The mitral valve is normal in structure. No evidence of mitral valve regurgitation. No evidence of mitral stenosis.  4. The aortic valve is normal in structure. Aortic valve regurgitation is not visualized. No aortic stenosis is present.  5. The inferior vena cava is normal in size with greater than 50% respiratory variability, suggesting right atrial pressure of 3 mmHg.  FINDINGS  Left Ventricle: Left ventricular ejection fraction, by estimation, is 60 to 65%. The left ventricle has normal function. The left ventricle has no regional Fess motion abnormalities. The left ventricular internal cavity size was normal in size. There is  mild left ventricular hypertrophy. Left ventricular diastolic parameters are consistent with Grade I diastolic dysfunction (impaired relaxation). Right Ventricle: The right ventricular size is normal. No increase in right ventricular Aten thickness. Right ventricular systolic function is normal. Left Atrium: Left atrial size was normal in size. Right Atrium: Right atrial size was normal in size. Pericardium: There is no evidence of pericardial effusion. Mitral Valve: The mitral valve is normal in structure. No evidence of mitral valve stenosis. Tricuspid Valve: The tricuspid valve is normal in structure. Tricuspid valve regurgitation is not demonstrated. No evidence of tricuspid stenosis. Aortic Valve: The aortic valve is normal in structure. Aortic valve regurgitation is not visualized. No aortic stenosis is present. Aortic valve mean gradient measures 5.0 mmHg. Aortic valve peak gradient measures 7.1 mmHg. Aortic valve area, by VTI measures 2.23 cm. Pulmonic Valve: The pulmonic valve was normal in structure. Pulmonic valve regurgitation is not visualized.  No evidence of pulmonic stenosis. Aorta: The aortic root is normal in size and structure. Venous: The inferior vena cava is normal in size with greater than 50% respiratory variability, suggesting right atrial pressure of 3 mmHg. IAS/Shunts: No atrial level shunt detected by color flow Doppler. LEFT VENTRICLE PLAX 2D LVIDd:         4.30 cm  Diastology LVIDs:         2.80 cm  LV e' medial:    5.22 cm/s LV PW:         1.40 cm  LV E/e' medial:  10.7 LV IVS:        1.20 cm  LV e' lateral:   6.85 cm/s LVOT diam:     2.00 cm  LV E/e' lateral: 8.2 LV SV:         69 LV SV Index:   30 LVOT Area:     3.14 cm   IVC IVC diam: 1.80 cm LEFT ATRIUM         Index LA diam:    3.20 cm 1.40 cm/m  AORTIC VALVE AV Area (Vmax):    2.65 cm AV Area (Vmean):   2.20 cm AV Area (VTI):     2.23 cm AV Vmax:           133.00 cm/s AV Vmean:          103.000 cm/s AV VTI:            0.310 m AV Peak Grad:      7.1 mmHg AV Mean Grad:      5.0 mmHg LVOT Vmax:         112.00 cm/s LVOT Vmean:        72.000 cm/s LVOT VTI:          0.220 m LVOT/AV VTI ratio: 0.71  AORTA Ao Root diam: 3.70 cm Ao Asc diam:  3.30 cm MITRAL VALVE MV Area (PHT): 2.44 cm    SHUNTS MV Decel Time: 311 msec    Systemic VTI:  0.22 m MV E velocity: 56.10 cm/s  Systemic Diam: 2.00 cm MV A velocity: 50.60 cm/s MV E/A ratio:  1.11 Candee Furbish MD Electronically signed by Candee Furbish MD Signature Date/Time: 01/30/2021/3:26:43 PM    Final

## 2021-02-13 NOTE — Progress Notes (Signed)
Palliative Medicine RN Note: Mr Ortner daughter Janae Bridgeman [phone]) called the Palliative office with questions about completing an [facility]. Mr Matlock is not a PMT patient, so I am limited on the access I have to his record. I did explain that for patients who are on isolation, we are unable to complete the form due to safety of our volunteers.   I reached out to Dr Sloan Leiter & RN Eugene Garnet, who is aware of daughter's questions. The options available for isolation patients are the MOST form and the ACP document in the [facility] tab. Dr Sloan Leiter will talk to Mr Rittenhouse later today. Eugene Garnet is trying to coordinate things with unit mgmt and will reach out to [facility] when she has more answers.  Marjie Skiff Angelisa Winthrop, RN, BSN, [place] Medicine Team 02/13/2021 1:52 PM Office [phone]

## 2021-02-14 ENCOUNTER — Inpatient Hospital Stay (HOSPITAL_COMMUNITY): Payer: Medicare Other

## 2021-02-14 DIAGNOSIS — J9601 Acute respiratory failure with hypoxia: Secondary | ICD-10-CM | POA: Diagnosis not present

## 2021-02-14 DIAGNOSIS — U071 COVID-19: Secondary | ICD-10-CM | POA: Diagnosis not present

## 2021-02-14 DIAGNOSIS — J1282 Pneumonia due to coronavirus disease 2019: Secondary | ICD-10-CM

## 2021-02-14 LAB — D-DIMER, QUANTITATIVE: D-Dimer, Quant: 3.26 ug/mL-FEU — ABNORMAL HIGH (ref 0.00–0.50)

## 2021-02-14 LAB — CBC
HCT: 39.5 % (ref 39.0–52.0)
Hemoglobin: 13.4 g/dL (ref 13.0–17.0)
MCH: 30.4 pg (ref 26.0–34.0)
MCHC: 33.9 g/dL (ref 30.0–36.0)
MCV: 89.6 fL (ref 80.0–100.0)
Platelets: 113 10*3/uL — ABNORMAL LOW (ref 150–400)
RBC: 4.41 MIL/uL (ref 4.22–5.81)
RDW: 13.2 % (ref 11.5–15.5)
WBC: 13.3 10*3/uL — ABNORMAL HIGH (ref 4.0–10.5)
nRBC: 0 % (ref 0.0–0.2)

## 2021-02-14 LAB — COMPREHENSIVE METABOLIC PANEL
ALT: 41 U/L (ref 0–44)
AST: 65 U/L — ABNORMAL HIGH (ref 15–41)
Albumin: 3.4 g/dL — ABNORMAL LOW (ref 3.5–5.0)
Alkaline Phosphatase: 67 U/L (ref 38–126)
Anion gap: 10 (ref 5–15)
BUN: 36 mg/dL — ABNORMAL HIGH (ref 8–23)
CO2: 22 mmol/L (ref 22–32)
Calcium: 10.1 mg/dL (ref 8.9–10.3)
Chloride: 94 mmol/L — ABNORMAL LOW (ref 98–111)
Creatinine, Ser: 1.51 mg/dL — ABNORMAL HIGH (ref 0.61–1.24)
GFR, Estimated: 49 mL/min — ABNORMAL LOW (ref >60)
Glucose, Bld: 117 mg/dL — ABNORMAL HIGH (ref 70–99)
Potassium: 5 mmol/L (ref 3.5–5.1)
Sodium: 126 mmol/L — ABNORMAL LOW (ref 135–145)
Total Bilirubin: 1.6 mg/dL — ABNORMAL HIGH (ref 0.3–1.2)
Total Protein: 6.6 g/dL (ref 6.5–8.1)

## 2021-02-14 LAB — POCT I-STAT 7, (LYTES, BLD GAS, ICA,H+H)
Acid-base deficit: 4 mmol/L — ABNORMAL HIGH (ref 0.0–2.0)
Bicarbonate: 22 mmol/L (ref 20.0–28.0)
Calcium, Ion: 1.42 mmol/L — ABNORMAL HIGH (ref 1.15–1.40)
HCT: 41 % (ref 39.0–52.0)
Hemoglobin: 13.9 g/dL (ref 13.0–17.0)
O2 Saturation: 78 %
Patient temperature: 98.7
Potassium: 5.2 mmol/L — ABNORMAL HIGH (ref 3.5–5.1)
Sodium: 129 mmol/L — ABNORMAL LOW (ref 135–145)
TCO2: 23 mmol/L (ref 22–32)
pCO2 arterial: 41 mmHg (ref 32.0–48.0)
pH, Arterial: 7.337 — ABNORMAL LOW (ref 7.350–7.450)
pO2, Arterial: 45 mmHg — ABNORMAL LOW (ref 83.0–108.0)

## 2021-02-14 LAB — OSMOLALITY, URINE: Osmolality, Ur: 647 mOsm/kg (ref 300–900)

## 2021-02-14 LAB — GLUCOSE, CAPILLARY
Glucose-Capillary: 128 mg/dL — ABNORMAL HIGH (ref 70–99)
Glucose-Capillary: 137 mg/dL — ABNORMAL HIGH (ref 70–99)
Glucose-Capillary: 120 mg/dL — ABNORMAL HIGH (ref 70–99)

## 2021-02-14 LAB — SODIUM, URINE, RANDOM: Sodium, Ur: 12 mmol/L

## 2021-02-14 MED ORDER — DIPHENHYDRAMINE HCL 50 MG/ML IJ SOLN: 25.0000 mg | INTRAMUSCULAR | Status: DC | PRN

## 2021-02-14 MED ORDER — ACETAMINOPHEN 325 MG PO TABS: 650.0000 mg | ORAL_TABLET | Freq: Four times a day (QID) | ORAL | Status: DC | PRN

## 2021-02-14 MED ORDER — MORPHINE 100MG IN NS 100ML (1MG/ML) PREMIX INFUSION
0.0000 mg/h | INTRAVENOUS | Status: DC
  Administered 2021-02-14: 5 mg/h via INTRAVENOUS
  Filled 2021-02-14: qty 100

## 2021-02-14 MED ORDER — LORAZEPAM 2 MG/ML IJ SOLN
INTRAMUSCULAR | Status: AC
  Administered 2021-02-14: 2 mg via INTRAVENOUS
  Filled 2021-02-14: qty 1

## 2021-02-14 MED ORDER — GLYCOPYRROLATE 1 MG PO TABS: 1.0000 mg | ORAL_TABLET | ORAL | Status: DC | PRN

## 2021-02-14 MED ORDER — DEXTROSE 5 % IV SOLN: INTRAVENOUS | Status: DC

## 2021-02-14 MED ORDER — GLYCOPYRROLATE 0.2 MG/ML IJ SOLN: 0.2000 mg | INTRAMUSCULAR | Status: DC | PRN

## 2021-02-14 MED ORDER — DEXMEDETOMIDINE HCL IN NACL 400 MCG/100ML IV SOLN
0.4000 ug/kg/h | INTRAVENOUS | Status: DC
  Administered 2021-02-14 (×2): 1.2 ug/kg/h via INTRAVENOUS
  Administered 2021-02-14: 0.4 ug/kg/h via INTRAVENOUS
  Filled 2021-02-14 (×4): qty 100

## 2021-02-14 MED ORDER — LORAZEPAM 2 MG/ML IJ SOLN: 2.0000 mg | Freq: Once | INTRAMUSCULAR | Status: AC

## 2021-02-14 MED ORDER — CHLORHEXIDINE GLUCONATE CLOTH 2 % EX PADS
6.0000 | MEDICATED_PAD | Freq: Every day | CUTANEOUS | Status: DC
  Administered 2021-02-14: 6 via TOPICAL

## 2021-02-14 MED ORDER — ACETAMINOPHEN 650 MG RE SUPP: 650.0000 mg | Freq: Four times a day (QID) | RECTAL | Status: DC | PRN

## 2021-02-14 MED ORDER — MORPHINE BOLUS VIA INFUSION
5.0000 mg | INTRAVENOUS | Status: DC | PRN
  Filled 2021-02-14: qty 5

## 2021-02-14 MED ORDER — FUROSEMIDE 10 MG/ML IJ SOLN
40.0000 mg | Freq: Once | INTRAMUSCULAR | Status: AC
  Administered 2021-02-14: 40 mg via INTRAVENOUS
  Filled 2021-02-14: qty 4

## 2021-02-14 MED ORDER — MORPHINE SULFATE (PF) 2 MG/ML IV SOLN: 2.0000 mg | INTRAVENOUS | Status: DC | PRN

## 2021-02-14 MED ORDER — GLYCOPYRROLATE 0.2 MG/ML IJ SOLN
0.2000 mg | INTRAMUSCULAR | Status: DC | PRN
  Administered 2021-02-14: 0.2 mg via INTRAVENOUS
  Filled 2021-02-14: qty 1

## 2021-02-14 MED ORDER — POLYVINYL ALCOHOL 1.4 % OP SOLN
1.0000 [drp] | Freq: Four times a day (QID) | OPHTHALMIC | Status: DC | PRN
  Filled 2021-02-14: qty 15

## 2021-02-18 ENCOUNTER — Telehealth: Payer: Self-pay | Admitting: Family Medicine

## 2021-02-18 NOTE — Telephone Encounter (Signed)
Death certificate needed signed

## 2021-02-18 NOTE — Telephone Encounter (Signed)
Caller : [place]  Call [phone]  Kurt Williams is requesting Dr Nani Ravens to sign death certificate, it will be up loaded to [facility] system.

## 2021-02-18 NOTE — Telephone Encounter (Signed)
Done

## 2021-03-13 NOTE — Progress Notes (Signed)
RT NOTES: Patient removed from bipap and placed on nasal cannula per orders.

## 2021-03-13 NOTE — Progress Notes (Signed)
Patient arrived to the unit at this time on respiratory distress on non-rebreather. Patient placed on Bi-Pap at 100 percent. Saturations went from 64% to currently 82%. MD at bedside. Will monitor on BiPap at this time to see is patient will tolerate Bipap without needing intubation. Patient is alert and responsive, able to follow commands and communicate. Patient clammy , HR 114 RR 44. Will monitor at this time.

## 2021-03-13 NOTE — Progress Notes (Signed)
Critical care note:  Date of note: Feb 26, 2021  Subjective: The patient has been having worsening respiratory distress with associated cough and desatted to 79% pulmonary to 71% on heated high flow nasal cannula at 65 L and nonrebreather at  15 L.  No chest pain.  No fever or chills.  No nausea or vomiting or abdominal pain.  Objective: Physical examination: Generally: Acutely ill elderly African-American male in acute respiratory distress on HHNC and NR. Vital signs, respiratory rate 43, heart rate 125, temperature 98.7 and pulse oximetry as mentioned above.   Head - atraumatic, normocephalic.  Pupils - equal, round and reactive to light and accommodation. Extraocular movements are intact. No scleral icterus.  Oropharynx -has nonrebreather.  Nose: Has heated high flow nasal cannula Neck - supple. No JVD. Carotid pulses 2+ bilaterally. No carotid bruits. No palpable thyromegaly or lymphadenopathy. Cardiovascular - regular rate and rhythm. Normal S1 and S2. No murmurs, gallops or rubs.  Lungs -diffusely coarse breath sounds with diminished bibasal breath sounds and bibasal crackles. Abdomen - soft and nontender. Positive bowel sounds. No palpable organomegaly or masses.  Extremities -1+ bilateral lower extremity pitting edema with no clubbing or cyanosis.  Neuro - grossly non-focal. Skin - no rashes. GU and rectal exam - deferred.  Notes and labs were reviewed.  Assessment/plan:  1.  Acute hypoxic respiratory failure requiring BiPAP secondary to COVID-19 viral pneumonia. -The patient will be transferred to the ICU. -I discussed the case with the intensivist Dr. Milon Dikes. -The patient will be placed on BiPAP in the ICU. -O2 protocol will be followed. -Status post IV steroids on 2/16 IV remdesivir on 2/16-2/20 and Actemra on 2/17 X1. -Blood cultures come back negative. -The patient is unvaccinated for COVID-19.  2.  Elevated D-dimer: Secondary to COVID-19 related inflammation-at risk for  VTE-repeat Doppler on 3/3 --unable to lie flat for VQ scan-Per RT/nursing staff-due to very high oxygen requirements-not able to transfer to radiology in any event.  Continue therapeutic Lovenox.    3.  CAD: No anginal symptoms-continue aspirin/statin  4.  Hyperkalemia: Resolved after Lokelma x2.  5.  Hyponatremia: Euvolemic on exam-received IV fluids yesterday-recheck electrolytes tomorrow.  If hyponatremia persists-may need to start IV Lasix-at risk for SIADH given lung infiltrates.  Check serum osmolality, urine osmolality and urine sodium.  6.  AKI on CKD stage IIIa: creatinine stable-close to baseline.  Authorized and performed by: Eugenie Norrie, MD Total critical care time: Approximately 35  minutes. Due to a high probability of clinically significant, life-threatening deterioration, the patient required my highest level of preparedness to intervene emergently and I personally spent this critical care time directly and personally managing the patient.  This critical care time included obtaining a history, examining the patient, pulse oximetry, ordering and review of studies, arranging urgent treatment with development of management plan, evaluation of patient's response to treatment, frequent reassessment, and discussions with other providers. This critical care time was performed to assess and manage the high probability of imminent, life-threatening deterioration that could result in multiorgan failure.  It was exclusive of separately billable procedures and treating other patients and teaching time.  Please see MDM section and the rest of the note for further information on patient assessment and treatment.

## 2021-03-13 NOTE — Progress Notes (Signed)
PCCM note  Patient has become increasingly tachypneic, increased work of breathing through the day Discussed with daughter who has requested comfort measures Orders placed for morphine drip and withdrawal of care  Marshell Garfinkel MD [facility] See Amion for pager  If no response to pager , please call [phone] until 7pm After 7:00 pm call Elink  [phone] 02-19-2021, 2:22 PM

## 2021-03-13 NOTE — Significant Event (Signed)
Rapid Response Event Note   Reason for Call :  65L HHFNC 100% and NRB, SpO2-73%, resp distress  Initial Focused Assessment:  Pt sitting on side of bed> +WOB, +accessory muscle use. Pt alert and oriented, able to follow commands. Pt c/o SOB, denies CP. Pt says his SOB is worse than at the beginning of the night.  Lungs rhonchi t/o. Skin warm and diaphoretic.   T-98.7, HR-127, SpO2-71% on 15L HHFNC 100% and 65L NRB(somehow flow meters had been switched).  Placed pt on 65L HHFNC 100% and 15L NRB to see if oxygen level would recover. SpO2 went down to 63%. Pt txed to 3M03 and placed on 100% bipap.    Interventions:  Pt placed on 65L HHFNC 100% and 15L NRB Tx to ICU for bipap PCCM consulted. Plan of Care:  Tx to 3M03   Event Summary:   MD Notified: Dr. Sidney Ace notified PTA RRT. Dr. Lorenda Hatchet) with Elink notified at [phone]. Dr. Dorothy(PCCM) to bedside at [phone].  Call [id number] Arrival [id number] End Time:  Dillard Essex, RN

## 2021-03-13 NOTE — Death Summary Note (Signed)
  DEATH SUMMARY   Patient Details  Name: Kurt Williams MRN: [id number] DOB: [birth year]  Admission/Discharge Information   Admit Date:  Feb 18, 2021  Date of Death: Date of Death: 03-07-2021  Time of Death: Time of Death: 03/23/1826  Length of Stay: 03-19-2023  Referring Physician: Shelda Pal, DO   Reason(s) for Hospitalization   Acute respiratory failure COVID-19 pneumonia  Diagnoses  Preliminary cause of death:   ARDS secondary to COVID-19  Secondary Diagnoses (including complications and co-morbidities):  Acute kidney injury Coronary artery disease Elevated D-dimer Hypokalemia Hyponatremia OSA Obesity  Brief Hospital Course (including significant findings, care, treatment, and services provided and events leading to death)  Kurt Williams is a 70 y.o. year old male who was admitted on 02/18/2021 for increasing shortness of breath secondary to Covid pneumonia.  He is unvaccinated and had been started on Z-Pak and prednisone 48 hours prior to admission.  Since his admission he had increasing oxygen requirements.  The patient had been self proning and using CPAP at times. On 03/08/2023  patient's hypoxia worsened and required BiPAP and transferred to ICU  He remained on the BiPAP with worsening mental status. Family meeting was held and he was made DNR. Through the day became increasingly tachypneic with increased work of breathing.  Family requested transition to comfort measures.  He was placed on morphine drip and taken off BiPAP.  Shortly thereafter he passed away with family at bedside  Marshell Garfinkel MD [facility] 23-Mar-2021, 10:55 AM

## 2021-03-13 NOTE — Progress Notes (Signed)
ANTICOAGULATION CONSULT NOTE -  Initial Consult  Pharmacy Consult for Enoxaparin Indication: Empiric VTE Treatment (Elevated D-Dimer)  Allergies  Allergen Reactions  . Bee Venom     Patient Measurements: Height: 6' (182.9 cm) Weight: 101.8 kg (224 lb 6.9 oz) IBW/kg (Calculated) : 77.6  Vital Signs: Temp: 98.7 F (37.1 C) (03/05 0529) Temp Source: Axillary (03/05 0529) BP: 142/91 (03/05 0637) Pulse Rate: 81 (03/05 0203)  Labs: Recent Labs    02/12/21 0118 02/13/21 0106 Feb 16, 2021 0421  HGB 14.4 13.4 13.4  HCT 42.2 39.8 39.5  PLT 166 126* 113*  CREATININE 1.63* 1.48* 1.51*    Estimated Creatinine Clearance: 55.4 mL/min (A) (by C-G formula based on SCr of 1.51 mg/dL (H)).   Medical History: Past Medical History:  Diagnosis Date  . Coronary artery disease   . GERD (gastroesophageal reflux disease)   . Headache(784.0)   . Migraine   . OSA on CPAP     Assessment: 70 yr old man admitted on 01/19/2021 with severe hypoxic respiratory failure due to COVID pneumonia. D-dimer cont to increase, now up to 8.64. Pharmacy is consulted for enoxaparin dosing for empiric treatment of VTE in setting of significantly elevated d-dimer.  Pt has been receiving enoxaparin 0.5 mg/kg (55 mg) SQ daily for VTE prophylaxis since 2/18 (last dose at 0841 AM today).   Hgb 13.4, plt 113; Scr 1.51, CrCl ~55 ml/min on enoxaparin 1 mg/kg bid   Goal of Therapy:  Empiric treatment for VTE in setting of COVID, elevated d-dimer Monitor platelets by anticoagulation protocol: Yes   Plan:  Continue Enoxaparin 1 mg/kg (100 mg) SQ Q 12 hrs Monitor CBC, SCr Monitor for bleeding and platelets  Alanda Slim, PharmD, [facility] Clinical Pharmacist Please see AMION for all Pharmacists' Contact Phone Numbers 02-16-21, 7:42 AM

## 2021-03-13 NOTE — Progress Notes (Signed)
[place] Progress Note Patient Name: Kurt Williams DOB: January 22, 1951 MRN: [id number]   Date of Service  2021/02/24  HPI/Events of Note  Patient transferred to the ICU for trial of BIPAP, failing which he will require intubation. He was transferred as an RRT for acute hypoxemic respiratory failure. Underlying diagnosis is Covid pneumonia against a background of OSA.  eICU Interventions  New Patient Evaluation completed.        Kerry Kass Ogan 2021/02/24, 6:38 AM

## 2021-03-13 NOTE — Progress Notes (Signed)
Patient's o2 sat was 78% with HHFNC 65% and non - rebreather 15L .patient was feeling SOB and respiratory distress. Patient was alert and oriented. Called rapid response , respiratory and MD j.Mansy. patient transfer to the ICU as per MD ordered. Notified patient's Daughter Laverta Baltimore via phone call. Given report to the ICU Nurse. Send all patient's belongings to the ICU including cell phone and phone charger.

## 2021-03-13 NOTE — Consult Note (Signed)
NAME:  Kurt Williams, MRN:  [id number], DOB:  1951-09-01, LOS: 23 ADMISSION DATE:  02/06/2021, CONSULTATION DATE: 2021/03/03 REFERRING MD:  Dr Sloan Leiter  CHIEF COMPLAINT: Shortness of breath  Brief History:  70 year old male with ARDS secondary to Covid pneumonia  History of Present Illness:  This is a 70 year old male that was admitted on 02/02/2021 for increasing shortness of breath secondary to Covid pneumonia.  He is unvaccinated and had been started on Z-Pak and prednisone 48 hours prior to admission.  Since his admission he had increasing oxygen requirements.  The patient had been self proning and using CPAP at times.  Tonight the patient's hypoxia worsened and required BiPAP.  He is currently awake and alert.  Despite his tachypnea and hypoxia he is not in any distress.  Past Medical History:  CAD GERD Migraine headaches OSA  Significant Hospital Events:  Patient completed remdesivir and tocilizumab  Consults:  Critical care medicine  Procedures:  None  Significant Diagnostic Tests:  Echocardiogram 01/30/2021. 1. Left ventricular ejection fraction, by estimation, is 60 to 65%. The  left ventricle has normal function. The left ventricle has no regional  [place] motion abnormalities. There is mild left ventricular hypertrophy.  Left ventricular diastolic parameters  are consistent with Grade I diastolic dysfunction (impaired relaxation).  2. Right ventricular systolic function is normal. The right ventricular  size is normal.  3. The mitral valve is normal in structure. No evidence of mitral valve  regurgitation. No evidence of mitral stenosis.  4. The aortic valve is normal in structure. Aortic valve regurgitation is  not visualized. No aortic stenosis is present.  5. The inferior vena cava is normal in size with greater than 50%  respiratory variability, suggesting right atrial pressure of 3 mmHg.   Micro Data:  Covid + 02/02/2021  Antimicrobials:   None    Objective   Blood pressure (!) 142/91, pulse 81, temperature 98.7 F (37.1 C), temperature source Axillary, resp. rate (!) 22, height 6' (1.829 m), weight 101.8 kg, SpO2 (!) 79 %.    FiO2 (%):  [100 %] 100 %   Intake/Output Summary (Last 24 hours) at 2021-03-03 0640 Last data filed at 03-03-2021 0539 Gross per 24 hour  Intake 360 ml  Output 200 ml  Net 160 ml   Filed Weights   01/27/2021 2151 01/29/21 0624 02/11/21 1945  Weight: 108.9 kg 107.6 kg 101.8 kg    Examination: General: Tachypneic HENT: Atraumatic/normocephalic mucous membranes are moist no significant JVD Lungs: Very mild crackles bilateral worse at the bases and apices.  No rhonchi or wheezing noted. Cardiovascular: Regular rate no murmur rub or gallop Abdomen: Soft nontender nondistended no rebound/rigidity/guarding. Extremities: Distal pulse intact x4.  No edema/cyanosis/clubbing. Neuro: Patient is conscious and alert.  No focal neurologic deficits.  Though this is limited by patient requirement to be on BiPAP and his respiratory status GU: Condom catheter in place    Assessment & Plan:  Acute respiratory failure ARDS secondary to Covid pneumonia Covid pneumonia AKI with CKDIII CAD Hyponatremia  Plan: Patient is transferred to the intensive care unit for further work-up. We will give the patient a trial of BiPAP prior to intubation.  Currently his oxygen saturations are slowly improving on BiPAP. Resume self proning. Add Precedex for anxiety secondary to tachypnea. Monitor I's/O's. Stat chest x-ray is pending. Lasix 40 mg IV now  Further plan will depend on his response to the current therapy.  He is very high risk for intubation.  Discussed possibility  of intubation with the patient and he understands that if BiPAP fails he will require that.  He is okay with intubation.  Best practice (evaluated daily)  Diet: N.p.o. at this time Pain/Anxiety/Delirium protocol (if indicated): Precedex VAP  protocol (if indicated): Not indicated DVT prophylaxis: Full dose Lovenox GI prophylaxis: Add Protonix Glucose control: Insulin sliding scale Mobility: Bedrest Disposition: Transfer to ICU  Goals of Care:   Code Status: Full  Labs   CBC: Recent Labs  Lab 02/09/21 0111 02/11/21 1221 02/12/21 0118 02/13/21 0106 Mar 10, 2021 0421  WBC 10.1 11.5* 11.2* 12.1* 13.3*  HGB 13.3 14.4 14.4 13.4 13.4  HCT 39.3 43.5 42.2 39.8 39.5  MCV 90.1 90.4 89.0 90.0 89.6  PLT 213 159 166 126* 113*    Basic Metabolic Panel: Recent Labs  Lab 02/09/21 0111 02/11/21 1221 02/12/21 0118 02/13/21 0106 03-10-2021 0421  NA 132* 133* 130* 127* 126*  K 4.8 5.0 5.4* 4.9 5.0  CL 100 101 100 99 94*  CO2 21* 22 21* 19* 22  GLUCOSE 96 108* 118* 108* 117*  BUN 38* 41* 40* 36* 36*  CREATININE 1.50* 1.53* 1.63* 1.48* 1.51*  CALCIUM 10.1 10.2 10.5* 9.9 10.1   GFR: Estimated Creatinine Clearance: 55.4 mL/min (A) (by C-G formula based on SCr of 1.51 mg/dL (H)). Recent Labs  Lab 02/11/21 1221 02/12/21 0118 02/13/21 0106 02/13/21 1304 03/10/2021 0421  PROCALCITON  --   --   --  <0.10  --   WBC 11.5* 11.2* 12.1*  --  13.3*    Liver Function Tests: Recent Labs  Lab 02/09/21 0111 02/11/21 1221 02/12/21 0118 02/13/21 0106 03/10/21 0421  AST 45* 57* 58* 58* 65*  ALT 33 41 40 39 41  ALKPHOS 61 60 56 51 67  BILITOT 1.2 1.5* 1.8* 1.7* 1.6*  PROT 6.6 7.2 7.1 6.5 6.6  ALBUMIN 3.3* 3.6 3.6 3.2* 3.4*   No results for input(s): LIPASE, AMYLASE in the last 168 hours. No results for input(s): AMMONIA in the last 168 hours.  ABG    Component Value Date/Time   PHART 7.391 02/08/2009 0417   PCO2ART 37.5 02/08/2009 0417   PO2ART 95.0 02/08/2009 0417   HCO3 22.7 02/08/2009 0417   TCO2 24 02/08/2009 0417   ACIDBASEDEF 2.0 02/08/2009 0417   O2SAT 97.0 02/08/2009 0417     Coagulation Profile: No results for input(s): INR, PROTIME in the last 168 hours.  Cardiac Enzymes: No results for input(s): CKTOTAL,  CKMB, CKMBINDEX, TROPONINI in the last 168 hours.  HbA1C: Hgb A1c MFr Bld  Date/Time Value Ref Range Status  01/29/2021 06:24 AM 6.0 (H) 4.8 - 5.6 % Final    Comment:    (NOTE) Pre diabetes:          5.7%-6.4%  Diabetes:              >6.4%  Glycemic control for   <7.0% adults with diabetes   03/03/2020 02:56 PM 5.9 (H) 4.8 - 5.6 % Final    Comment:             Prediabetes: 5.7 - 6.4          Diabetes: >6.4          Glycemic control for adults with diabetes: <7.0     CBG: Recent Labs  Lab 02/12/21 2102 02/13/21 0715 02/13/21 1157 02/13/21 1727 02/13/21 2141  GLUCAP 124* 101* 102* 114* 125*    Review of Systems:   Unable to obtain secondary to patient's respiratory status  Past Medical History:  He,  has a past medical history of Coronary artery disease, GERD (gastroesophageal reflux disease), Headache(784.0), Migraine, and OSA on CPAP.   Surgical History:   Past Surgical History:  Procedure Laterality Date  . CORONARY ANGIOPLASTY WITH STENT PLACEMENT  2000  . KNEE ARTHROSCOPY  1963   on Right Knee with removal of pieces of shattered bone  . KNEE ARTHROSCOPY W/ ACL RECONSTRUCTION  1973   Left knee  . OTHER SURGICAL HISTORY  2011   to lower organs on left side that were pressing on diaphragm     Social History:   reports that he has never smoked. He has never used smokeless tobacco. He reports current alcohol use. He reports that he does not use drugs.   Family History:  His family history includes Cancer in his brother, father, and mother; Diabetes in his father; Hypertension in his mother.   Allergies Allergies  Allergen Reactions  . Bee Venom      Home Medications  Prior to Admission medications   Medication Sig Start Date End Date Taking? Authorizing Provider  acetaminophen (TYLENOL) 500 MG tablet Take 1,000 mg by mouth every 6 (six) hours as needed for mild pain or headache.   Yes [provider]  ascorbic acid (VITAMIN C) 1000 MG tablet  Take 1,000 mg by mouth daily. 01/26/21  Yes [provider]  ASTAXANTHIN PO Take 12 mg by mouth daily.   Yes [provider]  azithromycin (ZITHROMAX) 250 MG tablet Take 250-500 mg by mouth See admin instructions. Take 500mg  by mouth on day one, then take 250mg  daily until gone. 01/26/21  Yes [provider]  Black Elderberry,Berry-Flower, 575 MG CAPS Take 575 mg by mouth daily.   Yes [provider]  Cholecalciferol (VITAMIN D3) 50 MCG (2000 UT) TABS Take 2,000 Units by mouth daily. 01/26/21  Yes [provider]  Cod Liver Oil CAPS Take 1 capsule by mouth daily.   Yes [provider]  EPINEPHrine 0.3 mg/0.3 mL IJ SOAJ injection Inject 0.3 mLs (0.3 mg total) into the muscle as needed for anaphylaxis. 06/19/19  Yes Shelda Pal, DO  Garlic 8676 MG CAPS Take 1,000 mg by mouth daily.   Yes [provider]  nitroGLYCERIN (NITROSTAT) 0.4 MG SL tablet Place 1 tablet (0.4 mg total) under the tongue every 5 (five) minutes as needed for chest pain. 08/09/18  Yes Shelda Pal, DO  OVER THE COUNTER MEDICATION Take 1 tablet by mouth daily as needed (allergies). Allergy medication OTC Claritin   Yes [provider]  OVER THE COUNTER MEDICATION Take 15 mLs by mouth daily. Sea Moss gel   Yes [provider]  predniSONE (STERAPRED UNI-PAK 21 TAB) 5 MG (21) TBPK tablet Take by mouth as directed. For 6 days 01/26/21  Yes [provider]  rosuvastatin (CRESTOR) 5 MG tablet Take 1 tablet (5 mg total) by mouth daily. 01/09/21  Yes Shelda Pal, DO  sildenafil (REVATIO) 20 MG tablet Take 2-3 tabs as needed for erectile dysfunction. Patient taking differently: Take 40-60 mg by mouth daily as needed (erectile dysfunction). 08/09/18  Yes Shelda Pal, DO  Zinc 50 MG TABS Take 50 mg by mouth daily. 01/26/21  Yes [provider]  aspirin 81 MG chewable tablet Chew 81 mg by mouth daily. Patient  not taking: Reported on 01/30/2021    [provider]     Critical care time: 50 minutes

## 2021-03-13 NOTE — Plan of Care (Addendum)
  Interdisciplinary Goals of Care Family Meeting   Date carried out:: February 17, 2021  Location of the meeting: Conference room  Member's involved: Physician, Bedside Registered Nurse and Family Member or next of kin  Durable Power of Attorney or acting medical decision maker: Wife and daughter    Discussion: We discussed goals of care for [name]   Discussed his clinical course and deterioration with severe ARDS from COVID-19 pneumonia.  Mr. Kurt Williams is on BiPAP and Precedex now, barely responsive to stimuli.  He will not do well with intubation as chances of survival are extremely poor.  Family has agreed to make him DNR.  Continue current level of care for now and allow the rest of the family including son to visit.  They are aware that he may pass away at any time.  Code status: Full DNR  Disposition: Continue current acute care  Time spent for the meeting: 35 mins  The patient is critically ill with multiple organ system failure and requires high complexity decision making for assessment and support, frequent evaluation and titration of therapies, advanced monitoring, review of radiographic studies and interpretation of complex data.   Critical Care Time devoted to patient care services, exclusive of separately billable procedures, described in this note is 35 minutes.   Marshell Garfinkel MD [facility] See Amion for pager  If no response to pager , please call [phone] until 7pm After 7:00 pm call Elink  [phone] Feb 17, 2021, 11:10 AM

## 2021-03-13 DEATH — deceased

## 2021-04-08 ENCOUNTER — Ambulatory Visit: Payer: Medicare Other | Admitting: Interventional Cardiology

## 2021-07-22 IMAGING — CT CT HEART MORP W/ CTA COR W/ SCORE W/ CA W/CM &/OR W/O CM
4 of 7 series · 8 of 20 positions shown, 9 images · IV contrast (APPLIED)
Comparison: None.
COMPARISON: None.

Addendum:
EXAM:
OVER-READ INTERPRETATION  CT CHEST

The following report is an over-read performed by radiologist Dr.
Masatoyo Dodo [REDACTED] on 03/10/2020. This
over-read does not include interpretation of cardiac or coronary
anatomy or pathology. The coronary calcium score/coronary CTA
interpretation by the cardiologist is attached.
CLINICAL DATA: Shortness of Breath
Cardiac/Coronary CTA
TECHNIQUE: The patient was scanned on a [employer] scanner. A 100 kV
prospective scan was triggered in the descending thoracic aorta at
111 HU's. Axial non-contrast 3 mm slices were carried out through
the heart. The data set was analyzed on a dedicated work station and
scored using the Agatson method. Gantry rotation speed was 250 msecs
and collimation was .6 mm. No beta blockade and 0.8 mg of sl NTG was
given. The 3D data set was reconstructed in 5% intervals of the
35-75 % of the R-R cycle. Diastolic phases were analyzed on a
dedicated work station using MPR, MIP and VRT modes. The patient
received 80 cc of contrast.

[Series 6: best diast 68 % · axial · 0.39mm/px · z∈[+1143,+1186]mm · 2 of 321 slices shown, 3 images]
[im 107/321  vessel]
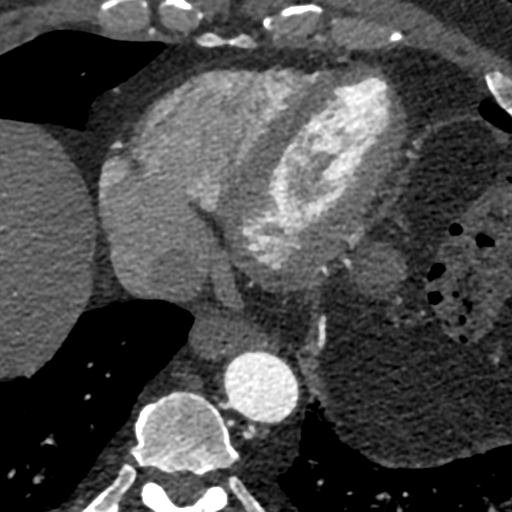
[im 107/321  lung]
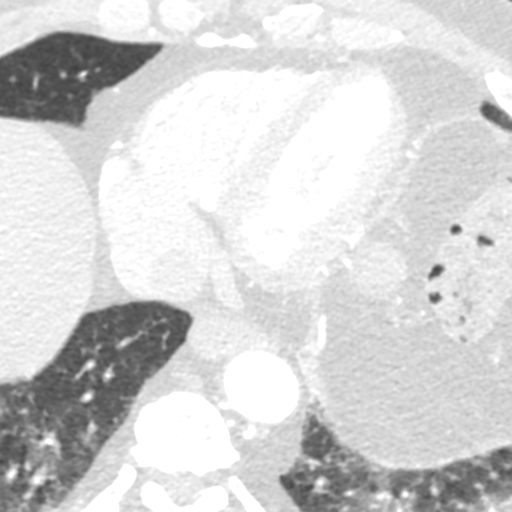
[im 214/321  vessel]
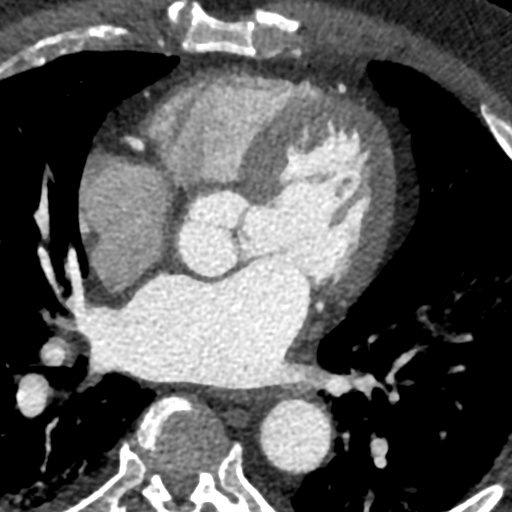

[Series 7: best syst 41 % · axial · 0.39mm/px · z∈[+1143,+1186]mm · 2 of 321 slices shown]
[im 107/321  vessel]
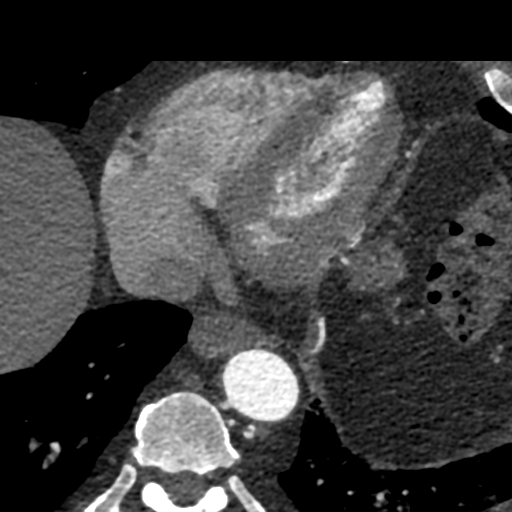
[im 214/321  vessel]
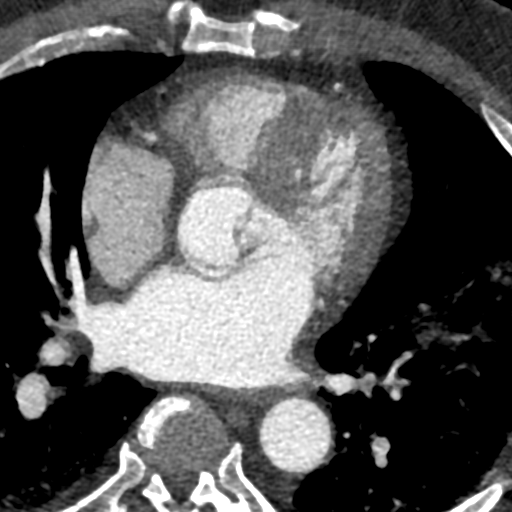

[Series 8: ts diast sharp 41 % · axial · 0.39mm/px · z∈[+1143,+1186]mm · 2 of 321 slices shown]
[im 107/321  lung]
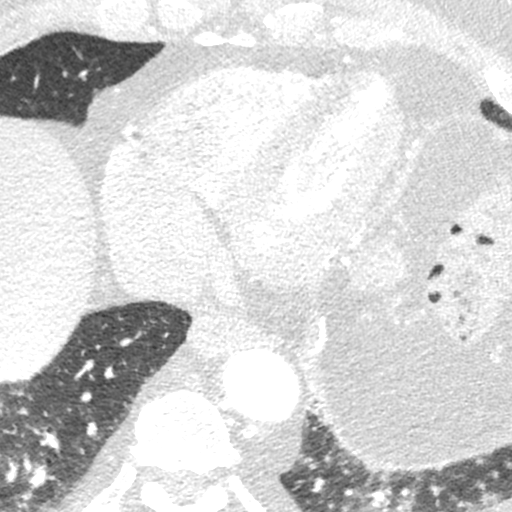
[im 214/321  lung]
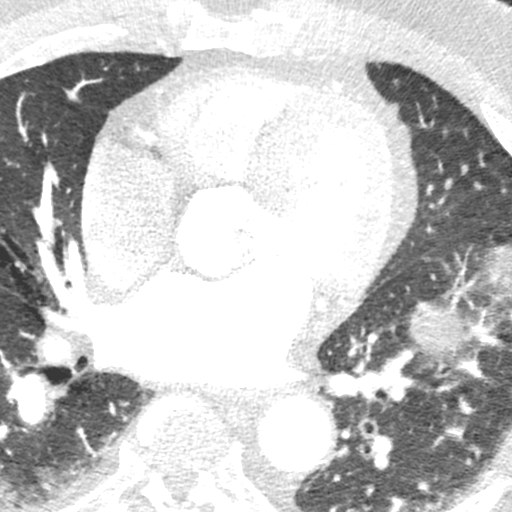

[Series 9: ts syst sharp 41 % · axial · 0.39mm/px · z∈[+1143,+1186]mm · 2 of 321 slices shown]
[im 107/321  lung]
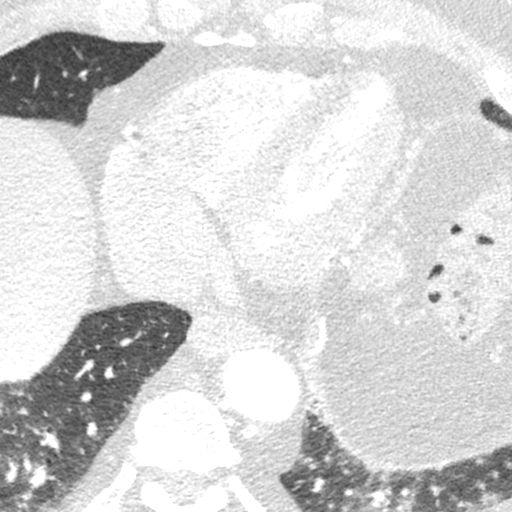
[im 214/321  lung]
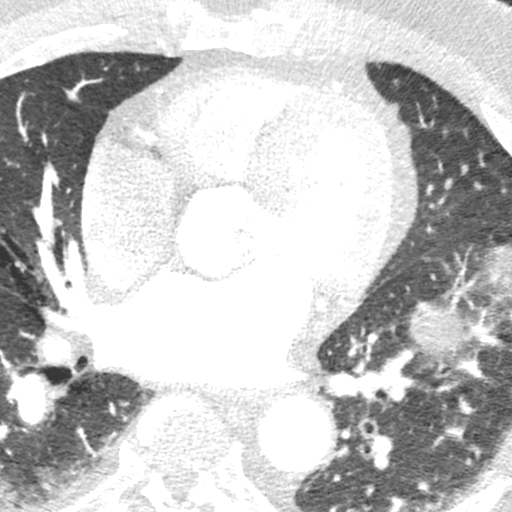

[8 of 20 positions shown; findings below may reference images not displayed]

FINDINGS: Within the visualized portions of the thorax there are no suspicious
appearing pulmonary nodules or masses, there is no acute
consolidative airspace disease, no pleural effusions, no
pneumothorax and no lymphadenopathy. Visualized portions of the
upper abdomen are unremarkable. There are no aggressive appearing
lytic or blastic lesions noted in the visualized portions of the
skeleton.
IMPRESSION: No significant incidental noncardiac findings are noted.
FINDINGS: Image quality: excellent.

Noise artifact is: Limited.

Coronary Arteries:  Normal coronary origin.  Right dominance.

Left main: The left main is a large caliber vessel with a normal
take off from the left coronary cusp that trifurcates to form a left
anterior descending artery, ramus intermedius, and a left circumflex
artery. There is no plaque or stenosis.

Left anterior descending artery: The proximal LAD contains minimal
non-calcified plaque (<25%). There is a mid LAD myocardial bridge.
The distal LAD contains minimal non-calcified plaque (<25%). The LAD
gives off 2 patent diagonal branches.

Ramus Intermedius: Small hypoplastic, patent vessel.

Left circumflex artery: The LCX is non-dominant. The proximal LCX
contains a short, patent stent. The remaining segments contain
minimal non-calcified plaque (<25%). The LCX gives off 3 patent
obtuse marginal branches.

Right coronary artery: The RCA is dominant with normal take off from
the right coronary cusp. There is minimal plaque (<25%). The RCA
terminates as a PDA without evidence of plaque or stenosis.

Right Atrium: Right atrial size is within normal limits.

Right Ventricle: The right ventricular cavity is within normal
limits.

Left Atrium: Left atrial size is normal in dilated with no left
atrial appendage filling defect.

Left Ventricle: The ventricular cavity size is within normal limits.
There are no stigmata of prior infarction. There is no abnormal
filling defect.

Pulmonary arteries: Normal in size without proximal filling defect.

Pulmonary veins: Normal pulmonary venous drainage.

Pericardium: Normal thickness with no significant effusion or
calcium present.

Cardiac valves: The aortic valve is trileaflet without significant
calcification. The mitral valve is normal structure without
significant calcification.

Aorta: Normal caliber with no significant disease.

Extra-cardiac findings: See attached radiology report for
non-cardiac structures.
IMPRESSION: 1. Coronary calcium score not performed due to prior stent.

2. Normal coronary origin with right dominance.

3. Minimal, non-obstructive CAD (<25%).

4. Patent stent in the proximal LCX.

5. Mid LAD myocardial bridge.

RECOMMENDATIONS:
1. Continue preventive therapy and risk factor modification.

*** End of Addendum ***
EXAM:
OVER-READ INTERPRETATION  CT CHEST

The following report is an over-read performed by radiologist Dr.
Masatoyo Dodo [REDACTED] on 03/10/2020. This
over-read does not include interpretation of cardiac or coronary
anatomy or pathology. The coronary calcium score/coronary CTA
interpretation by the cardiologist is attached.
FINDINGS: Within the visualized portions of the thorax there are no suspicious
appearing pulmonary nodules or masses, there is no acute
consolidative airspace disease, no pleural effusions, no
pneumothorax and no lymphadenopathy. Visualized portions of the
upper abdomen are unremarkable. There are no aggressive appearing
lytic or blastic lesions noted in the visualized portions of the
skeleton.
IMPRESSION: No significant incidental noncardiac findings are noted.

## 2022-01-14 ENCOUNTER — Ambulatory Visit: Payer: Medicare Other

## 2022-06-12 IMAGING — DX DG CHEST 1V PORT
1 series · 1 of 1 positions shown · non-contrast
Comparison: Portable exam 6866 hours compared to 01/28/2021

CLINICAL DATA: Shortness of breath, [id number]

EXAM:
PORTABLE CHEST 1 VIEW

[chest ap]
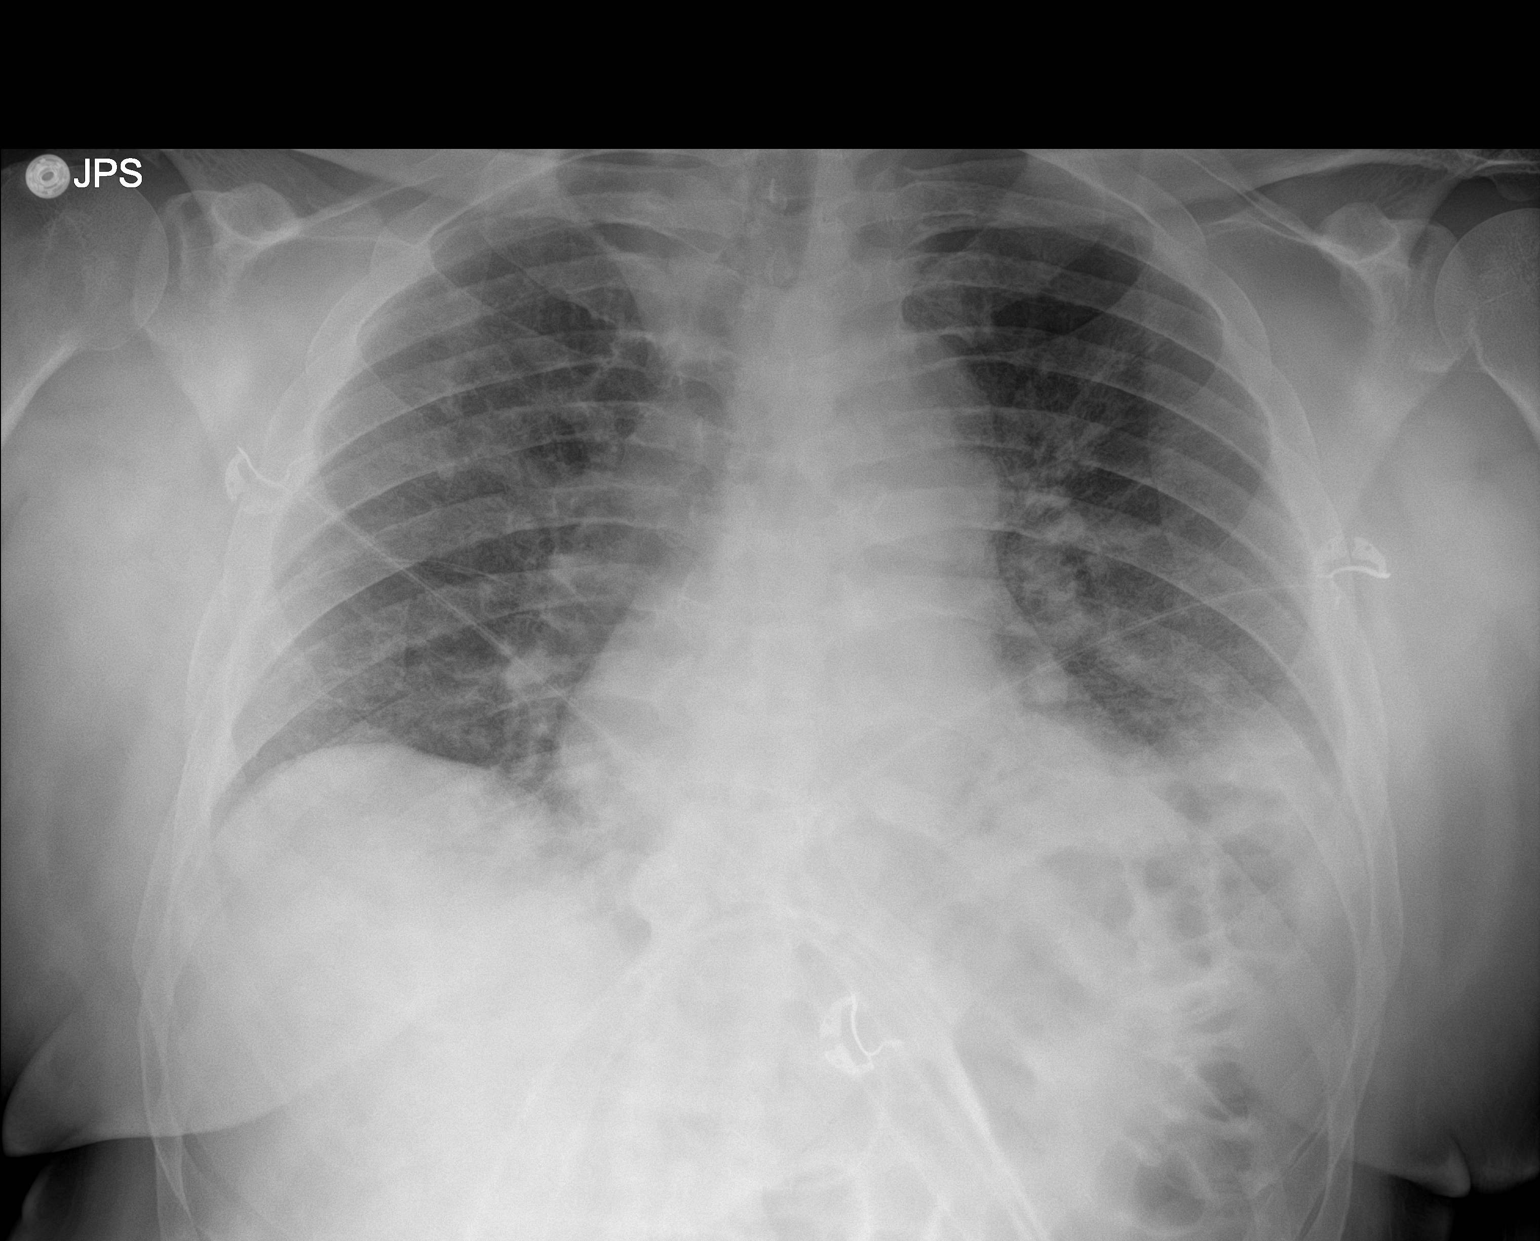

[1 of 1 positions shown; findings below may reference images not displayed]

FINDINGS: Normal heart size, mediastinal contours, and pulmonary vascularity.

Patchy infiltrates in mid to lower LEFT lung and minimally at RIGHT
base consistent with multifocal pneumonia.

RIGHT lung infiltrate is perhaps slightly improved since previous
exam.

No pleural effusion or pneumothorax.

Bones unremarkable.
IMPRESSION: BILATERAL pulmonary infiltrates consistent with multifocal
pneumonia, perhaps minimally improved in RIGHT lung.
